# Patient Record
Sex: Female | Born: 1985 | Race: White | Hispanic: No | Marital: Married | Smoking: Current every day smoker
Health system: Southern US, Community
[De-identification: ages and names within clinical notes are randomized; demographics above are authoritative.]

## PROBLEM LIST (undated history)

## (undated) DIAGNOSIS — R102 Pelvic and perineal pain: Secondary | ICD-10-CM

## (undated) DIAGNOSIS — E2839 Other primary ovarian failure: Secondary | ICD-10-CM

## (undated) DIAGNOSIS — D3A026 Benign carcinoid tumor of the rectum: Secondary | ICD-10-CM

## (undated) DIAGNOSIS — K529 Noninfective gastroenteritis and colitis, unspecified: Secondary | ICD-10-CM

## (undated) DIAGNOSIS — S99911A Unspecified injury of right ankle, initial encounter: Secondary | ICD-10-CM

## (undated) DIAGNOSIS — G8929 Other chronic pain: Secondary | ICD-10-CM

## (undated) DIAGNOSIS — K621 Rectal polyp: Secondary | ICD-10-CM

## (undated) DIAGNOSIS — K635 Polyp of colon: Secondary | ICD-10-CM

## (undated) DIAGNOSIS — K219 Gastro-esophageal reflux disease without esophagitis: Secondary | ICD-10-CM

## (undated) DIAGNOSIS — R519 Headache, unspecified: Secondary | ICD-10-CM

## (undated) DIAGNOSIS — R51 Headache: Secondary | ICD-10-CM

## (undated) DIAGNOSIS — D126 Benign neoplasm of colon, unspecified: Secondary | ICD-10-CM

## (undated) DIAGNOSIS — D259 Leiomyoma of uterus, unspecified: Secondary | ICD-10-CM

## (undated) DIAGNOSIS — N200 Calculus of kidney: Secondary | ICD-10-CM

## (undated) HISTORY — DX: Benign neoplasm of colon, unspecified: D12.6

## (undated) HISTORY — DX: Benign carcinoid tumor of the rectum: D3A.026

## (undated) HISTORY — PX: CHOLECYSTECTOMY: SHX55

---

## 2005-04-02 ENCOUNTER — Emergency Department (HOSPITAL_COMMUNITY): Admission: EM | Admit: 2005-04-02 | Discharge: 2005-04-02 | Payer: Self-pay | Admitting: Emergency Medicine

## 2005-12-16 ENCOUNTER — Ambulatory Visit (HOSPITAL_COMMUNITY): Admission: EM | Admit: 2005-12-16 | Discharge: 2005-12-17 | Payer: Self-pay | Admitting: Emergency Medicine

## 2007-01-30 ENCOUNTER — Emergency Department (HOSPITAL_COMMUNITY): Admission: EM | Admit: 2007-01-30 | Discharge: 2007-01-30 | Payer: Self-pay | Admitting: Emergency Medicine

## 2009-12-29 ENCOUNTER — Emergency Department (HOSPITAL_COMMUNITY): Admission: EM | Admit: 2009-12-29 | Discharge: 2009-12-30 | Payer: Self-pay | Admitting: Emergency Medicine

## 2010-01-05 ENCOUNTER — Emergency Department (HOSPITAL_COMMUNITY): Admission: EM | Admit: 2010-01-05 | Discharge: 2010-01-05 | Payer: Self-pay | Admitting: Emergency Medicine

## 2010-02-19 ENCOUNTER — Emergency Department (HOSPITAL_COMMUNITY)
Admission: EM | Admit: 2010-02-19 | Discharge: 2010-02-19 | Payer: Self-pay | Source: Home / Self Care | Admitting: Emergency Medicine

## 2010-05-27 LAB — HEPATIC FUNCTION PANEL
ALT: 27 U/L (ref 0–35)
Albumin: 3.6 g/dL (ref 3.5–5.2)
Alkaline Phosphatase: 55 U/L (ref 39–117)
Total Protein: 6.1 g/dL (ref 6.0–8.3)

## 2010-05-27 LAB — CBC
Platelets: 169 10*3/uL (ref 150–400)
RBC: 4.04 MIL/uL (ref 3.87–5.11)
WBC: 8.8 10*3/uL (ref 4.0–10.5)

## 2010-05-27 LAB — DIFFERENTIAL
Lymphocytes Relative: 25 % (ref 12–46)
Lymphs Abs: 2.2 10*3/uL (ref 0.7–4.0)
Neutro Abs: 5.7 10*3/uL (ref 1.7–7.7)
Neutrophils Relative %: 64 % (ref 43–77)

## 2010-05-27 LAB — URINALYSIS, ROUTINE W REFLEX MICROSCOPIC
Glucose, UA: NEGATIVE mg/dL
Protein, ur: NEGATIVE mg/dL
Urobilinogen, UA: 1 mg/dL (ref 0.0–1.0)

## 2010-05-27 LAB — BASIC METABOLIC PANEL
Calcium: 8.6 mg/dL (ref 8.4–10.5)
Chloride: 108 mEq/L (ref 96–112)
Creatinine, Ser: 0.84 mg/dL (ref 0.4–1.2)
GFR calc Af Amer: >60 mL/min (ref >60)

## 2010-07-13 ENCOUNTER — Emergency Department (HOSPITAL_COMMUNITY)
Admission: EM | Admit: 2010-07-13 | Discharge: 2010-07-14 | Disposition: A | Discharge status: Home / Self Care | Payer: Self-pay | Attending: Emergency Medicine | Admitting: Emergency Medicine

## 2010-07-13 DIAGNOSIS — T622X1A Toxic effect of other ingested (parts of) plant(s), accidental (unintentional), initial encounter: Secondary | ICD-10-CM | POA: Insufficient documentation

## 2010-07-13 DIAGNOSIS — L255 Unspecified contact dermatitis due to plants, except food: Secondary | ICD-10-CM | POA: Insufficient documentation

## 2010-07-13 DIAGNOSIS — F17200 Nicotine dependence, unspecified, uncomplicated: Secondary | ICD-10-CM | POA: Insufficient documentation

## 2010-07-13 DIAGNOSIS — Y929 Unspecified place or not applicable: Secondary | ICD-10-CM | POA: Insufficient documentation

## 2010-07-13 DIAGNOSIS — IMO0002 Reserved for concepts with insufficient information to code with codable children: Secondary | ICD-10-CM | POA: Insufficient documentation

## 2010-07-13 DIAGNOSIS — M79609 Pain in unspecified limb: Secondary | ICD-10-CM | POA: Insufficient documentation

## 2010-07-13 DIAGNOSIS — X58XXXA Exposure to other specified factors, initial encounter: Secondary | ICD-10-CM | POA: Insufficient documentation

## 2010-07-31 NOTE — Consult Note (Signed)
Patricia Rivers, Patricia Rivers              ACCOUNT NO.:  [id number]   MEDICAL RECORD NO.:  [id number]          PATIENT TYPE:  OIB   LOCATION:  A427                          FACILITY:  [facility]   PHYSICIAN:  Tilda Burrow, M.D. DATE OF BIRTH:  [birth year]   DATE OF CONSULTATION:  12/16/2005  DATE OF DISCHARGE:                                   CONSULTATION   ADMISSION DIAGNOSES:  1. Pelvic pain, lower abdominal pain unresponsive to intravenous      narcotics.  2. Dysmenorrhea.  3. Hyperglycemia.   HISTORY OF PRESENT ILLNESS:  This 25 year old gravida 0, para 0, last  menstrual period 4 days ago, with a long history of dysmenorrhea but no  periods ever this painful, has presented to the emergency room with what she  perceives as pain at a 10 out of 10 (she relates this as equal to when she  had her head busted open.  The pain has required several doses of  Dilaudid, however, it has not resolved.  She was seen in the emergency room  by Dr. Margretta Ditty whose evaluation includes a normal white count at 8900,  hemoglobin 15, hematocrit 43.  GLUCOSE IS ELEVATED AT 292.   Blood pressure is normal with a pulse ranging from 60 to 89. The patient was  admitted for monitoring her pain through the night, repeating electrolytes  in a.m., repeat CBC in a.m. and probable short hospital stay.  She has been  evaluated in the emergency room with a pelvic ultrasound which shows a  2.1 cm fibroid in the uterus but is otherwise negative.  Some small amount  of cul-de-sac fluid, and small functional retro-ovarian cyst of 2.1 cm.   PHYSICAL EXAMINATION:  GENERAL:  Weight 170, height 5 feet, 7 inches. The  patient is an uncomfortable appearing Caucasian female. Red headed. Lying on  her right side in a relatively immobile curled up position.  HEENT:  Pupils are equal, round and reactive.  NEURO:  She is alert and oriented.  CHEST:  Breathing is unlabored.  ABDOMEN:  Tender suprapubic area, no rebound tenderness.  PELVIC EXAM:  Not repeated by me but previously extremely uncomfortable by  Dr. Margretta Ditty, but transvaginal ultrasound was not perceived as particularly  painful.   PLAN:  Observe to ensure pain is not worsening and will discharge promptly  to outpatient monitoring if condition does not deteriorate by a.m.  Observed overnite, stable for discharge the following morning.      Tilda Burrow, M.D.  Electronically Signed     JVF/MEDQ  D:  12/16/2005  T:  12/17/2005  Job:  [id number]

## 2010-09-06 ENCOUNTER — Emergency Department (HOSPITAL_COMMUNITY)
Admission: EM | Admit: 2010-09-06 | Discharge: 2010-09-07 | Disposition: A | Discharge status: Home / Self Care | Payer: Self-pay | Attending: Emergency Medicine | Admitting: Emergency Medicine

## 2010-09-06 DIAGNOSIS — M25559 Pain in unspecified hip: Secondary | ICD-10-CM | POA: Insufficient documentation

## 2010-09-06 DIAGNOSIS — F17200 Nicotine dependence, unspecified, uncomplicated: Secondary | ICD-10-CM | POA: Insufficient documentation

## 2010-09-06 DIAGNOSIS — M76899 Other specified enthesopathies of unspecified lower limb, excluding foot: Secondary | ICD-10-CM | POA: Insufficient documentation

## 2010-09-07 ENCOUNTER — Emergency Department (HOSPITAL_COMMUNITY): Payer: Self-pay

## 2010-09-07 LAB — BASIC METABOLIC PANEL
CO2: 21 mEq/L (ref 19–32)
Calcium: 9.7 mg/dL (ref 8.4–10.5)
Chloride: 105 mEq/L (ref 96–112)
Glucose, Bld: 96 mg/dL (ref 70–99)
Sodium: 136 mEq/L (ref 135–145)

## 2010-09-07 LAB — CBC
HCT: 40.5 % (ref 36.0–46.0)
Hemoglobin: 14.2 g/dL (ref 12.0–15.0)
MCHC: 35.1 g/dL (ref 30.0–36.0)
RBC: 4.53 MIL/uL (ref 3.87–5.11)

## 2010-09-07 LAB — DIFFERENTIAL
Basophils Absolute: 0 10*3/uL (ref 0.0–0.1)
Lymphocytes Relative: 37 % (ref 12–46)
Monocytes Absolute: 0.7 10*3/uL (ref 0.1–1.0)
Monocytes Relative: 6 % (ref 3–12)
Neutro Abs: 5.9 10*3/uL (ref 1.7–7.7)

## 2010-12-15 ENCOUNTER — Emergency Department (HOSPITAL_COMMUNITY)
Admission: EM | Admit: 2010-12-15 | Discharge: 2010-12-15 | Disposition: A | Discharge status: Home / Self Care | Payer: Self-pay | Attending: Emergency Medicine | Admitting: Emergency Medicine

## 2010-12-15 ENCOUNTER — Encounter: Payer: Self-pay | Admitting: *Deleted

## 2010-12-15 ENCOUNTER — Emergency Department (HOSPITAL_COMMUNITY): Payer: Self-pay

## 2010-12-15 DIAGNOSIS — M25539 Pain in unspecified wrist: Secondary | ICD-10-CM | POA: Insufficient documentation

## 2010-12-15 DIAGNOSIS — M779 Enthesopathy, unspecified: Secondary | ICD-10-CM | POA: Insufficient documentation

## 2010-12-15 DIAGNOSIS — F17200 Nicotine dependence, unspecified, uncomplicated: Secondary | ICD-10-CM | POA: Insufficient documentation

## 2010-12-15 MED ORDER — DEXAMETHASONE 6 MG PO TABS: ORAL_TABLET | ORAL | Status: AC

## 2010-12-15 MED ORDER — DEXAMETHASONE SODIUM PHOSPHATE 4 MG/ML IJ SOLN
8.0000 mg | Freq: Once | INTRAMUSCULAR | Status: AC
  Administered 2010-12-15: 8 mg via INTRAMUSCULAR
  Filled 2010-12-15: qty 2

## 2010-12-15 NOTE — ED Notes (Signed)
Pt states initial injury was in June 2011 when she fell down stairs landing on left wrist.  Pt reports it was fractured at that time, and went on to receive cortisone injections for about 3 months. Last cortisone injection was September 2011. Wrist has noted edema into the forearm. Pt states most painful when she moves her thumb.  Denies injury/trauma in past 2 days and is uncertain as to why wrist has "just started hurting".  Xray pending.

## 2010-12-15 NOTE — ED Notes (Signed)
Pt c/o pain to her left wrist. Pt states she hurt her wrist about a year ago and 2 days ago started hurting worse. Pt denies any further injury.

## 2010-12-21 NOTE — ED Provider Notes (Signed)
Medical screening examination/treatment/procedure(s) were performed by non-physician practitioner and as supervising physician I was immediately available for consultation/collaboration.   Andrew King, MD 12/21/10 0746 

## 2010-12-21 NOTE — ED Provider Notes (Signed)
History     CSN: [id number] Arrival date & time: 12/15/2010 11:12 AM  Chief Complaint  Patient presents with  . Wrist Pain    (Consider location/radiation/quality/duration/timing/severity/associated sxs/prior treatment) Patient is a 25 y.o. female presenting with wrist pain. The history is provided by the patient.  Wrist Pain This is a recurrent problem. The current episode started yesterday. The problem occurs daily. The problem has been gradually worsening. Pertinent negatives include no abdominal pain, arthralgias, chest pain, chills, coughing, fever, joint swelling or neck pain. Exacerbated by: certain movements. She has tried nothing for the symptoms. The treatment provided no relief.    History reviewed. No pertinent past medical history.  History reviewed. No pertinent past surgical history.  No family history on file.  History  Substance Use Topics  . Smoking status: Current Everyday Smoker -- 0.5 packs/day  . Smokeless tobacco: Not on file  . Alcohol Use: Yes     occasionally    OB History    Grav Para Term Preterm Abortions TAB SAB Ect Mult Living                  Review of Systems  Constitutional: Negative for fever, chills and activity change.       All ROS Neg except as noted in HPI  HENT: Negative for nosebleeds and neck pain.   Eyes: Negative for photophobia and discharge.  Respiratory: Negative for cough, shortness of breath and wheezing.   Cardiovascular: Negative for chest pain and palpitations.  Gastrointestinal: Negative for abdominal pain and blood in stool.  Genitourinary: Negative for dysuria, frequency and hematuria.  Musculoskeletal: Negative for back pain, joint swelling and arthralgias.  Skin: Negative.   Neurological: Negative for dizziness, seizures and speech difficulty.  Psychiatric/Behavioral: Negative for hallucinations and confusion.    Allergies  Codeine; Hydromorphone hcl; and Penicillins  Home Medications   Current  Outpatient Rx  Name Route Sig Dispense Refill  . IBUPROFEN 200 MG PO TABS Oral Take 800 mg by mouth every 6 (six) hours as needed. For pain     . DEXAMETHASONE 6 MG PO TABS  1 po daily with food 7 tablet 0    BP 118/84  Pulse 77  Temp(Src) 98.9 F (37.2 C) (Oral)  Resp 16  Ht 5\' 8"  (1.727 m)  Wt 187 lb (84.823 kg)  BMI 28.43 kg/m2  SpO2 100%  Physical Exam  Nursing note and vitals reviewed. Constitutional: She is oriented to person, place, and time. She appears well-developed and well-nourished.  Non-toxic appearance.  HENT:  Head: Normocephalic.  Right Ear: Tympanic membrane and external ear normal.  Left Ear: Tympanic membrane and external ear normal.  Eyes: EOM and lids are normal. Pupils are equal, round, and reactive to light.  Neck: Normal range of motion. Neck supple. Carotid bruit is not present.  Cardiovascular: Normal rate, regular rhythm, normal heart sounds, intact distal pulses and normal pulses.   Pulmonary/Chest: Breath sounds normal. No respiratory distress.  Abdominal: Soft. Bowel sounds are normal. There is no tenderness. There is no guarding.  Musculoskeletal: Normal range of motion.       Pain with ROM of the left wrist. No hot joints  Lymphadenopathy:       Head (right side): No submandibular adenopathy present.       Head (left side): No submandibular adenopathy present.    She has no cervical adenopathy.  Neurological: She is alert and oriented to person, place, and time. She has normal strength. No cranial  nerve deficit or sensory deficit.  Skin: Skin is warm and dry.  Psychiatric: She has a normal mood and affect. Her speech is normal.    ED Course  Procedures (including critical care time)  Labs Reviewed - No data to display No results found.   1. Tendonitis       MDM  I have reviewed nursing notes, vital signs, and all appropriate lab and imaging results for this patient.        Kathie Dike, [place] 12/21/10 [phone]

## 2010-12-22 LAB — CBC
Hemoglobin: 14.4
RBC: 4.79
WBC: 8.1

## 2010-12-22 LAB — WET PREP, GENITAL: Clue Cells Wet Prep HPF POC: NONE SEEN

## 2010-12-22 LAB — COMPREHENSIVE METABOLIC PANEL
ALT: 14
AST: 14
Alkaline Phosphatase: 59
CO2: 27
GFR calc non Af Amer: >60
Glucose, Bld: 96
Potassium: 4.5
Sodium: 139

## 2010-12-22 LAB — URINALYSIS, ROUTINE W REFLEX MICROSCOPIC
Glucose, UA: NEGATIVE
Ketones, ur: 40 — AB
pH: 5.5

## 2010-12-22 LAB — DIFFERENTIAL
Basophils Relative: 0
Eosinophils Absolute: 0.2
Eosinophils Relative: 2
Monocytes Relative: 7
Neutrophils Relative %: 76

## 2010-12-22 LAB — GC/CHLAMYDIA PROBE AMP, GENITAL: GC Probe Amp, Genital: NEGATIVE

## 2010-12-22 LAB — URINE MICROSCOPIC-ADD ON

## 2010-12-22 LAB — PREGNANCY, URINE: Preg Test, Ur: NEGATIVE

## 2011-03-28 ENCOUNTER — Encounter (HOSPITAL_COMMUNITY): Payer: Self-pay

## 2011-03-28 ENCOUNTER — Emergency Department (HOSPITAL_COMMUNITY)
Admission: EM | Admit: 2011-03-28 | Discharge: 2011-03-29 | Disposition: A | Discharge status: Home / Self Care | Payer: Self-pay | Attending: Emergency Medicine | Admitting: Emergency Medicine

## 2011-03-28 DIAGNOSIS — S335XXA Sprain of ligaments of lumbar spine, initial encounter: Secondary | ICD-10-CM | POA: Insufficient documentation

## 2011-03-28 DIAGNOSIS — S39012A Strain of muscle, fascia and tendon of lower back, initial encounter: Secondary | ICD-10-CM

## 2011-03-28 DIAGNOSIS — M545 Low back pain, unspecified: Secondary | ICD-10-CM | POA: Insufficient documentation

## 2011-03-28 DIAGNOSIS — Y92009 Unspecified place in unspecified non-institutional (private) residence as the place of occurrence of the external cause: Secondary | ICD-10-CM | POA: Insufficient documentation

## 2011-03-28 DIAGNOSIS — X58XXXA Exposure to other specified factors, initial encounter: Secondary | ICD-10-CM | POA: Insufficient documentation

## 2011-03-28 MED ORDER — ONDANSETRON 8 MG PO TBDP
8.0000 mg | ORAL_TABLET | Freq: Once | ORAL | Status: AC
  Administered 2011-03-28: 8 mg via ORAL
  Filled 2011-03-28: qty 1

## 2011-03-28 MED ORDER — DIAZEPAM 5 MG PO TABS
5.0000 mg | ORAL_TABLET | Freq: Once | ORAL | Status: AC
  Administered 2011-03-28: 5 mg via ORAL
  Filled 2011-03-28: qty 1

## 2011-03-28 MED ORDER — OXYCODONE-ACETAMINOPHEN 5-325 MG PO TABS
2.0000 | ORAL_TABLET | Freq: Once | ORAL | Status: AC
  Administered 2011-03-28: 2 via ORAL
  Filled 2011-03-28: qty 2

## 2011-03-28 NOTE — ED Notes (Signed)
Pt states she got up out of the recliner last night and felt her lower back "pop"  States is worse today.

## 2011-03-29 ENCOUNTER — Emergency Department (HOSPITAL_COMMUNITY): Payer: Self-pay

## 2011-03-29 LAB — URINALYSIS, ROUTINE W REFLEX MICROSCOPIC
Bilirubin Urine: NEGATIVE
Glucose, UA: NEGATIVE mg/dL
Hgb urine dipstick: NEGATIVE
Ketones, ur: NEGATIVE mg/dL
Leukocytes, UA: NEGATIVE
Nitrite: NEGATIVE
Protein, ur: NEGATIVE mg/dL
Specific Gravity, Urine: >1.03 — ABNORMAL HIGH (ref 1.005–1.030)
Urobilinogen, UA: 1 mg/dL (ref 0.0–1.0)
pH: 6 (ref 5.0–8.0)

## 2011-03-29 LAB — POCT PREGNANCY, URINE: Preg Test, Ur: NEGATIVE

## 2011-03-29 MED ORDER — METHOCARBAMOL 500 MG PO TABS: ORAL_TABLET | ORAL | Status: DC

## 2011-03-29 MED ORDER — OXYCODONE-ACETAMINOPHEN 5-325 MG PO TABS
1.0000 | ORAL_TABLET | Freq: Once | ORAL | Status: AC
  Administered 2011-03-29: 1 via ORAL
  Filled 2011-03-29: qty 1

## 2011-03-29 MED ORDER — OXYCODONE-ACETAMINOPHEN 5-325 MG PO TABS: 1.0000 | ORAL_TABLET | Freq: Four times a day (QID) | ORAL | Status: AC | PRN

## 2011-03-29 MED ORDER — ONDANSETRON HCL 4 MG PO TABS: 4.0000 mg | ORAL_TABLET | Freq: Four times a day (QID) | ORAL | Status: AC

## 2011-03-29 MED ORDER — KETOROLAC TROMETHAMINE 10 MG PO TABS
10.0000 mg | ORAL_TABLET | Freq: Once | ORAL | Status: AC
  Administered 2011-03-29: 10 mg via ORAL
  Filled 2011-03-29: qty 1

## 2011-03-29 NOTE — ED Provider Notes (Signed)
History     CSN: [id number]  Arrival date & time 03/28/11  2236   First MD Initiated Contact with Patient 03/28/11 2251      Chief Complaint  Patient presents with  . Back Pain    (Consider location/radiation/quality/duration/timing/severity/associated sxs/prior treatment) HPI Comments: Patient states that all last evening she was getting out of a recliner, when she felt a" pop" in her lower back. The patient states that from that time to the time of admission she's been having increasing pain in her lower back the patient tried ibuprofen but this was not successful in improving the pain. She now has pain when she lies down she has pain with movement and request assistance for her pain. There's been no previous injury, or surgery, or procedures of the lower back. She denies any loss of bowel or bladder function up to this point. She has had a great deal of nausea. And request assistance with this as well.  Patient is a 26 y.o. female presenting with back pain. The history is provided by the patient.  Back Pain  Pertinent negatives include no chest pain, no abdominal pain and no dysuria.    History reviewed. No pertinent past medical history.  History reviewed. No pertinent past surgical history.  No family history on file.  History  Substance Use Topics  . Smoking status: Current Everyday Smoker -- 0.5 packs/day  . Smokeless tobacco: Not on file  . Alcohol Use: Yes     occasionally    OB History    Grav Para Term Preterm Abortions TAB SAB Ect Mult Living                  Review of Systems  Constitutional: Negative for activity change.       All ROS Neg except as noted in HPI  HENT: Negative for nosebleeds and neck pain.   Eyes: Negative for photophobia and discharge.  Respiratory: Negative for cough, shortness of breath and wheezing.   Cardiovascular: Negative for chest pain and palpitations.  Gastrointestinal: Negative for abdominal pain and blood in stool.    Genitourinary: Negative for dysuria, frequency, hematuria and difficulty urinating.  Musculoskeletal: Positive for back pain. Negative for arthralgias.  Skin: Negative.   Neurological: Negative for dizziness, seizures and speech difficulty.  Psychiatric/Behavioral: Negative for hallucinations and confusion.    Allergies  Codeine; Hydromorphone hcl; and Penicillins  Home Medications   Current Outpatient Rx  Name Route Sig Dispense Refill  . IBUPROFEN 200 MG PO TABS Oral Take 800 mg by mouth every 6 (six) hours as needed. For pain       BP 131/87  Temp(Src) 99.3 F (37.4 C) (Oral)  Resp 20  Ht 5\' 8"  (1.727 m)  Wt 180 lb (81.647 kg)  BMI 27.37 kg/m2  SpO2 100%  Physical Exam  Nursing note and vitals reviewed. Constitutional: She is oriented to person, place, and time. She appears well-developed and well-nourished.  Non-toxic appearance.  HENT:  Head: Normocephalic.  Right Ear: Tympanic membrane and external ear normal.  Left Ear: Tympanic membrane and external ear normal.  Eyes: EOM and lids are normal. Pupils are equal, round, and reactive to light.  Neck: Normal range of motion. Neck supple. Carotid bruit is not present.  Cardiovascular: Normal rate, regular rhythm, normal heart sounds, intact distal pulses and normal pulses.   Pulmonary/Chest: Breath sounds normal. No respiratory distress.  Abdominal: Soft. Bowel sounds are normal. There is no tenderness. There is no guarding.  Musculoskeletal:  Normal range of motion.       Pain to palpation of the mid lumbar area. No palpable step off. The area is not hot. Patient has pain with change of position.  Lymphadenopathy:       Head (right side): No submandibular adenopathy present.       Head (left side): No submandibular adenopathy present.    She has no cervical adenopathy.  Neurological: She is alert and oriented to person, place, and time. She has normal strength. No cranial nerve deficit or sensory deficit.  Skin: Skin  is warm and dry.  Psychiatric: She has a normal mood and affect. Her speech is normal.    ED Course  Procedures (including critical care time)   Labs Reviewed  POCT PREGNANCY, URINE  POCT PREGNANCY, URINE   Dg Lumbar Spine Complete  03/29/2011  *RADIOLOGY REPORT*  Clinical Data: Low back pain  LUMBAR SPINE - COMPLETE 4+ VIEW  Comparison: 12/30/2009  Findings: Normal lumbar spine alignment.  No compression fracture, wedge shaped deformity or focal kyphosis.  No pars defects.  Normal pedicles.  Unremarkable SI joints.  IMPRESSION: No acute finding.  Original Report Authenticated By: Judie Petit. Ruel Favors, M.D.     No diagnosis found.    MDM  I have reviewed nursing notes, vital signs, and all appropriate lab and imaging results for this patient.  This patient was in her usual state of good health until January 12 when she was getting out of a recliner and felt a" pop in her lower back". This was followed by increasing bouts of pain to the lower back, made worse with change of position in standing. Today the pain got to the point where it was difficult for the patient to move without severe pain, and she came to the emergency department for evaluation and management of her pain. Urine pregnancy test was found to be negative. Lumbar spine films were negative for acute findings.  Urine analysis will be obtained prior to patient discharge. Patient states that after 10 mg of the Percocet she is still having a great deal of pain. Additional Percocet and ketorolac will be given to the patient. Patient's care will be continued by Dr. Jeraldine Loots (1:52 AM). Results for orders placed during the hospital encounter of 03/28/11  POCT PREGNANCY, URINE      Component Value Range   Preg Test, Ur NEGATIVE    URINALYSIS, ROUTINE W REFLEX MICROSCOPIC      Component Value Range   Color, Urine YELLOW  YELLOW    APPearance CLEAR  CLEAR    Specific Gravity, Urine >1.030 (*) 1.005 - 1.030    pH 6.0  5.0 - 8.0     Glucose, UA NEGATIVE  NEGATIVE (mg/dL)   Hgb urine dipstick NEGATIVE  NEGATIVE    Bilirubin Urine NEGATIVE  NEGATIVE    Ketones, ur NEGATIVE  NEGATIVE (mg/dL)   Protein, ur NEGATIVE  NEGATIVE (mg/dL)   Urobilinogen, UA 1.0  0.0 - 1.0 (mg/dL)   Nitrite NEGATIVE  NEGATIVE    Leukocytes, UA NEGATIVE  NEGATIVE    Dg Lumbar Spine Complete  03/29/2011  *RADIOLOGY REPORT*  Clinical Data: Low back pain  LUMBAR SPINE - COMPLETE 4+ VIEW  Comparison: 12/30/2009  Findings: Normal lumbar spine alignment.  No compression fracture, wedge shaped deformity or focal kyphosis.  No pars defects.  Normal pedicles.  Unremarkable SI joints.  IMPRESSION: No acute finding.  Original Report Authenticated By: Judie Petit. TREVOR SHICK, M.D.  Kathie Dike, [place] 03/29/11 [phone]

## 2011-03-29 NOTE — ED Notes (Signed)
Discharge instructions reviewed with pt, questions answered. Pt verbalized understanding.  

## 2011-03-29 NOTE — ED Notes (Signed)
Pt out to radiology

## 2011-04-01 NOTE — ED Provider Notes (Signed)
Medical screening examination/treatment/procedure(s) were performed by non-physician practitioner and as supervising physician I was immediately available for consultation/collaboration.   Gerhard Munch, MD 04/01/11 [phone]

## 2011-05-17 ENCOUNTER — Emergency Department (HOSPITAL_COMMUNITY): Payer: Self-pay

## 2011-05-17 ENCOUNTER — Emergency Department (HOSPITAL_COMMUNITY)
Admission: EM | Admit: 2011-05-17 | Discharge: 2011-05-17 | Disposition: A | Discharge status: Home / Self Care | Payer: Self-pay | Attending: Emergency Medicine | Admitting: Emergency Medicine

## 2011-05-17 ENCOUNTER — Encounter (HOSPITAL_COMMUNITY): Payer: Self-pay | Admitting: Emergency Medicine

## 2011-05-17 DIAGNOSIS — X503XXA Overexertion from repetitive movements, initial encounter: Secondary | ICD-10-CM | POA: Insufficient documentation

## 2011-05-17 DIAGNOSIS — S39012A Strain of muscle, fascia and tendon of lower back, initial encounter: Secondary | ICD-10-CM

## 2011-05-17 DIAGNOSIS — S335XXA Sprain of ligaments of lumbar spine, initial encounter: Secondary | ICD-10-CM | POA: Insufficient documentation

## 2011-05-17 MED ORDER — OXYCODONE-ACETAMINOPHEN 5-325 MG PO TABS
1.0000 | ORAL_TABLET | Freq: Once | ORAL | Status: AC
  Administered 2011-05-17: 1 via ORAL
  Filled 2011-05-17: qty 1

## 2011-05-17 MED ORDER — METHOCARBAMOL 500 MG PO TABS
1000.0000 mg | ORAL_TABLET | Freq: Once | ORAL | Status: AC
  Administered 2011-05-17: 1000 mg via ORAL
  Filled 2011-05-17: qty 2

## 2011-05-17 MED ORDER — HYDROCODONE-ACETAMINOPHEN 5-325 MG PO TABS: ORAL_TABLET | ORAL | Status: AC

## 2011-05-17 MED ORDER — METHOCARBAMOL 500 MG PO TABS: ORAL_TABLET | ORAL | Status: DC

## 2011-05-17 NOTE — ED Notes (Signed)
Patient transported to X-ray 

## 2011-05-17 NOTE — ED Notes (Signed)
Returned from x-ray.  Pain low back. Alert, talking,  Visitor at bedside.

## 2011-05-17 NOTE — Discharge Instructions (Signed)

## 2011-05-17 NOTE — ED Notes (Signed)
Pt states her lower back is hurting up to the middle. Pt states her legs are giving out as well. Pt states she moved furniture yesterday, but denies any injury to the back.

## 2011-05-17 NOTE — ED Provider Notes (Signed)
History     CSN: [id number]  Arrival date & time 05/17/11  1019   First MD Initiated Contact with Patient 05/17/11 1045      Chief Complaint  Patient presents with  . Back Pain  . Fall    (Consider location/radiation/quality/duration/timing/severity/associated sxs/prior treatment) HPI Comments: Patient c/o lower back pain that began yesterday after she was lifting and moving furniture.  Describes the pain as sharp and radiating into her bilateral thighs.  She denies incontinence, perineal  Numbness,dysuria, weakness or abdominal pain.    Patient is a 26 y.o. female presenting with back pain. The history is provided by the patient. No language interpreter was used.  Back Pain  This is a new problem. The current episode started yesterday. The problem occurs constantly. The problem has not changed since onset.The pain is associated with lifting heavy objects and twisting. The pain is present in the lumbar spine and sacro-iliac joint. The quality of the pain is described as aching. The pain radiates to the left thigh and right thigh. The pain is mild. The symptoms are aggravated by bending, twisting and certain positions. The pain is the same all the time. Associated symptoms include leg pain. Pertinent negatives include no chest pain, no fever, no numbness, no abdominal pain, no abdominal swelling, no bowel incontinence, no perianal numbness, no bladder incontinence, no dysuria, no pelvic pain, no paresthesias, no paresis, no tingling and no weakness. She has tried muscle relaxants and NSAIDs for the symptoms. The treatment provided no relief.    History reviewed. No pertinent past medical history.  History reviewed. No pertinent past surgical history.  History reviewed. No pertinent family history.  History  Substance Use Topics  . Smoking status: Current Everyday Smoker -- 0.5 packs/day  . Smokeless tobacco: Not on file  . Alcohol Use: Yes     occasionally    OB History    Grav  Para Term Preterm Abortions TAB SAB Ect Mult Living                  Review of Systems  Constitutional: Negative for fever, chills and fatigue.  HENT: Negative for neck pain and neck stiffness.   Respiratory: Negative for shortness of breath.   Cardiovascular: Negative for chest pain.  Gastrointestinal: Negative for nausea, vomiting, abdominal pain and bowel incontinence.  Genitourinary: Negative for bladder incontinence, dysuria, hematuria, flank pain, difficulty urinating and pelvic pain.  Musculoskeletal: Positive for back pain. Negative for myalgias and arthralgias.  Skin: Negative for rash.  Neurological: Negative for dizziness, tingling, weakness, numbness and paresthesias.  Hematological: Does not bruise/bleed easily.  All other systems reviewed and are negative.    Allergies  Codeine; Hydromorphone hcl; and Penicillins  Home Medications   Current Outpatient Rx  Name Route Sig Dispense Refill  . IBUPROFEN 200 MG PO TABS Oral Take 800 mg by mouth every 6 (six) hours as needed. For pain     . METHOCARBAMOL 500 MG PO TABS  2 tabs by mouth 3 times a day for spasm/pain 30 tablet 0    BP 122/80  Pulse 95  Temp(Src) 97.6 F (36.4 C) (Oral)  Resp 18  Ht 5\' 8"  (1.727 m)  Wt 175 lb (79.379 kg)  BMI 26.61 kg/m2  SpO2 100%  LMP 05/14/2011  Physical Exam  Nursing note and vitals reviewed. Constitutional: She is oriented to person, place, and time. She appears well-developed and well-nourished. No distress.  HENT:  Head: Normocephalic and atraumatic.  Cardiovascular: Normal rate,  regular rhythm, normal heart sounds and intact distal pulses.   No murmur heard. Pulmonary/Chest: Effort normal and breath sounds normal. No respiratory distress.  Abdominal: Soft. She exhibits no distension. There is no tenderness. There is no rebound and no guarding.  Musculoskeletal: She exhibits tenderness. She exhibits no edema.       Lumbar back: She exhibits tenderness. She exhibits normal  range of motion, no bony tenderness, no swelling, no edema, no spasm and normal pulse.       Back:  Neurological: She is alert and oriented to person, place, and time. No cranial nerve deficit or sensory deficit. She exhibits normal muscle tone. Coordination and gait normal.  Reflex Scores:      Patellar reflexes are 2+ on the right side and 2+ on the left side.      Achilles reflexes are 2+ on the right side and 2+ on the left side. Skin: Skin is warm and dry.    ED Course  Procedures (including critical care time)  Labs Reviewed - No data to display Dg Lumbar Spine Complete  05/17/2011  *RADIOLOGY REPORT*  Clinical Data: Low back pain following a fall.  LUMBAR SPINE - COMPLETE 4+ VIEW  Comparison: 03/29/2011.  Findings: Five non-rib bearing lumbar vertebrae.  These continue to have normal appearances.  No fractures, pars defects or subluxations seen.  IMPRESSION: Normal examination, unchanged.  Original Report Authenticated By: Darrol Angel, M.D.        MDM    Patient has tenderness to palpation of the lumbar paraspinal muscles and bilateral SI joints area no focal neuro deficits on exam. Patient is ambulatory with a steady gait.  She requests a referral for a primary care physician.  She agrees to close followup with the return here if symptoms worsen.   Patient / Family / Caregiver understand and agree with initial ED impression and plan with expectations set for ED visit. Pt stable in ED with no significant deterioration in condition.                      Ora Bollig L. Trisha Mangle, [place] 05/17/11 1935

## 2011-05-18 NOTE — ED Provider Notes (Signed)
Medical screening examination/treatment/procedure(s) were performed by non-physician practitioner and as supervising physician I was immediately available for consultation/collaboration.  Nicoletta Dress. Colon Branch, MD 05/18/11 [phone]

## 2011-06-25 ENCOUNTER — Other Ambulatory Visit: Payer: Self-pay | Admitting: Family Medicine

## 2011-06-25 DIAGNOSIS — M545 Low back pain: Secondary | ICD-10-CM

## 2011-07-01 ENCOUNTER — Ambulatory Visit
Admission: RE | Admit: 2011-07-01 | Discharge: 2011-07-01 | Disposition: A | Discharge status: Home / Self Care | Payer: No Typology Code available for payment source | Source: Ambulatory Visit | Attending: Family Medicine | Admitting: Family Medicine

## 2011-07-01 DIAGNOSIS — M545 Low back pain: Secondary | ICD-10-CM

## 2013-04-03 ENCOUNTER — Emergency Department (HOSPITAL_COMMUNITY): Payer: Self-pay

## 2013-04-03 ENCOUNTER — Encounter (HOSPITAL_COMMUNITY): Payer: Self-pay | Admitting: Emergency Medicine

## 2013-04-03 ENCOUNTER — Emergency Department (HOSPITAL_COMMUNITY)
Admission: EM | Admit: 2013-04-03 | Discharge: 2013-04-03 | Disposition: A | Discharge status: Home / Self Care | Payer: Self-pay | Attending: Emergency Medicine | Admitting: Emergency Medicine

## 2013-04-03 DIAGNOSIS — R1011 Right upper quadrant pain: Secondary | ICD-10-CM | POA: Insufficient documentation

## 2013-04-03 DIAGNOSIS — Z3202 Encounter for pregnancy test, result negative: Secondary | ICD-10-CM | POA: Insufficient documentation

## 2013-04-03 DIAGNOSIS — R197 Diarrhea, unspecified: Secondary | ICD-10-CM | POA: Insufficient documentation

## 2013-04-03 DIAGNOSIS — R109 Unspecified abdominal pain: Secondary | ICD-10-CM

## 2013-04-03 DIAGNOSIS — R112 Nausea with vomiting, unspecified: Secondary | ICD-10-CM | POA: Insufficient documentation

## 2013-04-03 DIAGNOSIS — Z880 Allergy status to penicillin: Secondary | ICD-10-CM | POA: Insufficient documentation

## 2013-04-03 DIAGNOSIS — F17200 Nicotine dependence, unspecified, uncomplicated: Secondary | ICD-10-CM | POA: Insufficient documentation

## 2013-04-03 LAB — HEPATIC FUNCTION PANEL
ALBUMIN: 3.7 g/dL (ref 3.5–5.2)
ALK PHOS: 92 U/L (ref 39–117)
ALT: 27 U/L (ref 0–35)
AST: 20 U/L (ref 0–37)
BILIRUBIN TOTAL: 0.7 mg/dL (ref 0.3–1.2)
Total Protein: 7 g/dL (ref 6.0–8.3)

## 2013-04-03 LAB — BASIC METABOLIC PANEL
BUN: 12 mg/dL (ref 6–23)
CALCIUM: 9.5 mg/dL (ref 8.4–10.5)
CO2: 25 meq/L (ref 19–32)
CREATININE: 0.78 mg/dL (ref 0.50–1.10)
Chloride: 103 mEq/L (ref 96–112)
GFR calc Af Amer: >90 mL/min (ref >90)
GFR calc non Af Amer: >90 mL/min (ref >90)
GLUCOSE: 88 mg/dL (ref 70–99)
Potassium: 4.5 mEq/L (ref 3.7–5.3)
Sodium: 139 mEq/L (ref 137–147)

## 2013-04-03 LAB — PREGNANCY, URINE: Preg Test, Ur: NEGATIVE

## 2013-04-03 LAB — URINALYSIS, ROUTINE W REFLEX MICROSCOPIC
BILIRUBIN URINE: NEGATIVE
GLUCOSE, UA: NEGATIVE mg/dL
HGB URINE DIPSTICK: NEGATIVE
KETONES UR: NEGATIVE mg/dL
Leukocytes, UA: NEGATIVE
Nitrite: NEGATIVE
PH: 6 (ref 5.0–8.0)
PROTEIN: NEGATIVE mg/dL
Specific Gravity, Urine: 1.025 (ref 1.005–1.030)
Urobilinogen, UA: 1 mg/dL (ref 0.0–1.0)

## 2013-04-03 LAB — CBC
HCT: 44 % (ref 36.0–46.0)
HEMOGLOBIN: 15.3 g/dL — AB (ref 12.0–15.0)
MCH: 31.3 pg (ref 26.0–34.0)
MCHC: 34.8 g/dL (ref 30.0–36.0)
MCV: 90 fL (ref 78.0–100.0)
Platelets: 180 10*3/uL (ref 150–400)
RBC: 4.89 MIL/uL (ref 3.87–5.11)
RDW: 12.6 % (ref 11.5–15.5)
WBC: 8.2 10*3/uL (ref 4.0–10.5)

## 2013-04-03 LAB — LIPASE, BLOOD: Lipase: 29 U/L (ref 11–59)

## 2013-04-03 MED ORDER — SODIUM CHLORIDE 0.9 % IV BOLUS (SEPSIS)
1000.0000 mL | Freq: Once | INTRAVENOUS | Status: AC
  Administered 2013-04-03: 1000 mL via INTRAVENOUS

## 2013-04-03 MED ORDER — ONDANSETRON HCL 4 MG/2ML IJ SOLN
4.0000 mg | Freq: Once | INTRAMUSCULAR | Status: AC
  Administered 2013-04-03: 4 mg via INTRAVENOUS
  Filled 2013-04-03: qty 2

## 2013-04-03 MED ORDER — MORPHINE SULFATE 4 MG/ML IJ SOLN
4.0000 mg | Freq: Once | INTRAMUSCULAR | Status: AC
  Administered 2013-04-03: 4 mg via INTRAVENOUS
  Filled 2013-04-03: qty 1

## 2013-04-03 MED ORDER — PROMETHAZINE HCL 25 MG PO TABS: 25.0000 mg | ORAL_TABLET | Freq: Four times a day (QID) | ORAL | Status: DC | PRN

## 2013-04-03 MED ORDER — OXYCODONE-ACETAMINOPHEN 5-325 MG PO TABS: 1.0000 | ORAL_TABLET | ORAL | Status: DC | PRN

## 2013-04-03 NOTE — Discharge Instructions (Signed)
Abdominal Pain, Adult Many things can cause belly (abdominal) pain. Most times, the belly pain is not dangerous. Many cases of belly pain can be watched and treated at home. HOME CARE   Do not take medicines that help you go poop (laxatives) unless told to by your doctor.  Only take medicine as told by your doctor.  Eat or drink as told by your doctor. Your doctor will tell you if you should be on a special diet. GET HELP IF:  You do not know what is causing your belly pain.  You have belly pain while you are sick to your stomach (nauseous) or have runny poop (diarrhea).  You have pain while you pee or poop.  Your belly pain wakes you up at night.  You have belly pain that gets worse or better when you eat.  You have belly pain that gets worse when you eat fatty foods. GET HELP RIGHT AWAY IF:   The pain does not go away within 2 hours.  You have a fever.  You keep throwing up (vomiting).  The pain changes and is only in the right or left part of the belly.  You have bloody or tarry looking poop. MAKE SURE YOU:   Understand these instructions.  Will watch your condition.  Will get help right away if you are not doing well or get worse. Document Released: 08/18/2007 Document Revised: 12/20/2012 Document Reviewed: 11/08/2012 [facility] Patient Information 2014 [place].   Ultrasound tomorrow at 11 AM. Registered nurse will give you more information. Medication for pain and nausea. Avoid rich greasy foods. Increase fluids.

## 2013-04-03 NOTE — ED Provider Notes (Addendum)
CSN: [id number]     Arrival date & time 04/03/13  1318 History  This chart was scribed for Nat Christen, MD by Roxan Diesel, ED scribe.  This patient was seen in room APA15/APA15 and the patient's care was started at 2:18 PM.   Chief Complaint  Patient presents with  . Abdominal Pain    The history is provided by the patient. No language interpreter was used.    HPI Comments: Patricia Rivers is a 28 y.o. female who presents to the Emergency Department complaining of severe, sharp, progressively-worsening RUQ abdominal pain that began 5 days ago.  Pt states that her pain radiates into RLQ and right flank.  She also complains of nausea, vomiting and diarrhea.  She states she is vomiting whenever she eats.  She denies urinary symptoms.  Pt is on ibuprofen 800mg  for a foot fracture.  She denies using any other medications currently.  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  Pt admits to family h/o cholecystectomy (grandmother and sister).   No past medical history on file.  History reviewed. No pertinent past surgical history.  No family history on file.   History  Substance Use Topics  . Smoking status: Current Every Day Smoker -- 0.50 packs/day  . Smokeless tobacco: Not on file  . Alcohol Use: Yes     Comment: occasionally    OB History   Grav Para Term Preterm Abortions TAB SAB Ect Mult Living                  Review of Systems A complete 10 system review of systems was obtained and all systems are negative except as noted in the HPI and PMH.    Allergies  Codeine; Hydromorphone hcl; and Penicillins  Home Medications   Current Outpatient Rx  Name  Route  Sig  Dispense  Refill  . HYDROcodone-acetaminophen (NORCO) 7.5-325 MG per tablet   Oral   Take 1 tablet by mouth 2 (two) times daily as needed for moderate pain.         [name] ibuprofen (ADVIL,MOTRIN) 800 MG tablet   Oral   Take 800 mg by mouth daily as needed for moderate pain.         [name]  oxyCODONE-acetaminophen (PERCOCET) 5-325 MG per tablet   Oral   Take 1 tablet by mouth every 4 (four) hours as needed.   20 tablet   0   . promethazine (PHENERGAN) 25 MG tablet   Oral   Take 1 tablet (25 mg total) by mouth every 6 (six) hours as needed for nausea or vomiting.   20 tablet   0    BP 115/81  Pulse 90  Temp(Src) 97.4 F (36.3 C) (Oral)  Resp 20  Ht 5\' 8"  (1.727 m)  Wt 218 lb (98.884 kg)  BMI 33.15 kg/m2  SpO2 99%  Physical Exam  Nursing note and vitals reviewed. Constitutional: She is oriented to person, place, and time. She appears well-developed and well-nourished.  HENT:  Head: Normocephalic and atraumatic.  Eyes: Conjunctivae and EOM are normal. Pupils are equal, round, and reactive to light.  Neck: Normal range of motion. Neck supple.  Cardiovascular: Normal rate, regular rhythm and normal heart sounds.   Pulmonary/Chest: Effort normal and breath sounds normal.  Abdominal: Soft. Bowel sounds are normal. There is tenderness (Minimal tenderness to RLQ, RUQ and right flank).  Musculoskeletal: Normal range of motion.  Neurological: She is alert and oriented to person, place, and time.  Skin:  Skin is warm and dry.  Psychiatric: She has a normal mood and affect. Her behavior is normal.    ED Course  Procedures (including critical care time)  DIAGNOSTIC STUDIES: Oxygen Saturation is 99% on room air, normal by my interpretation.    COORDINATION OF CARE: 2:21 PM-Discussed treatment plan which includes US abdomen and pain medication with pt at bedside and pt agreed to plan.    Labs Review Labs Reviewed  CBC - Abnormal; Notable for the following:    Hemoglobin 15.3 (*)    All other components within normal limits  BASIC METABOLIC PANEL  URINALYSIS, ROUTINE W REFLEX MICROSCOPIC  PREGNANCY, URINE  HEPATIC FUNCTION PANEL  LIPASE, BLOOD    Imaging Review Dg Abd Acute W/chest  04/03/2013   CLINICAL DATA:  Right-sided pain with diarrhea and vomiting.   EXAM: ACUTE ABDOMEN SERIES (ABDOMEN 2 VIEW & CHEST 1 VIEW)  COMPARISON:  Chest x-ray 12/30/2009 and lumbar spine 03/29/2011  FINDINGS: Lungs are clear. Cardiomediastinal silhouette and remainder of the chest is unchanged.  Abdominal films demonstrate no free peritoneal air. There are several air-filled nondilated loops of large small bowel. Remaining bones and soft tissues are unremarkable.  IMPRESSION: No acute cardiopulmonary disease.  Nonspecific, nonobstructive bowel gas pattern.   Electronically Signed   By: Marin Olp M.D.   On: 04/03/2013 16:28    EKG Interpretation   None       MDM   1. Abdominal pain    No acute abdomen. Screening labs, urinalysis, abdominal series negative. We'll schedule ultrasound of gallbladder for tomorrow. Discharge medications Percocet and Phenergan 25 mg    I personally performed the services described in this documentation, which was scribed in my presence. The recorded information has been reviewed and is accurate.    Nat Christen, MD 04/03/13 [place], MD 04/05/13 2051

## 2013-04-03 NOTE — ED Notes (Signed)
Pt alert & oriented x4, stable gait. Patient given discharge instructions, paperwork & prescription(s). Patient  instructed to stop at the registration desk to finish any additional paperwork. Patient verbalized understanding. Pt left department w/ no further questions. 

## 2013-04-03 NOTE — ED Notes (Signed)
Nausea and vomiting for the past 5 days, abdominal pin right lower quadrant onset last night

## 2013-04-04 ENCOUNTER — Ambulatory Visit (HOSPITAL_COMMUNITY)
Admission: RE | Admit: 2013-04-04 | Discharge: 2013-04-04 | Disposition: A | Discharge status: Home / Self Care | Payer: Self-pay | Source: Ambulatory Visit | Attending: Emergency Medicine | Admitting: Emergency Medicine

## 2013-04-04 DIAGNOSIS — R109 Unspecified abdominal pain: Secondary | ICD-10-CM | POA: Insufficient documentation

## 2013-04-04 NOTE — ED Provider Notes (Signed)
11:05  04/04/13  Patient returned this morning for outpatient abdominal ultrasound. Ultrasound revealed prominent liver without acute findings. No evidence of gallstones or gallbladder wall thickening. Results reviewed and discussed with the patient. She agrees to close followup, referral information for local primary care physicians and [facility] was given.  Hector Taft L. Vanessa [place], PA-C 04/04/13 1205

## 2013-04-05 DIAGNOSIS — R1011 Right upper quadrant pain: Secondary | ICD-10-CM | POA: Insufficient documentation

## 2013-04-05 DIAGNOSIS — Z3202 Encounter for pregnancy test, result negative: Secondary | ICD-10-CM | POA: Insufficient documentation

## 2013-04-05 DIAGNOSIS — Z880 Allergy status to penicillin: Secondary | ICD-10-CM | POA: Insufficient documentation

## 2013-04-05 DIAGNOSIS — R112 Nausea with vomiting, unspecified: Secondary | ICD-10-CM | POA: Insufficient documentation

## 2013-04-05 DIAGNOSIS — F17200 Nicotine dependence, unspecified, uncomplicated: Secondary | ICD-10-CM | POA: Insufficient documentation

## 2013-04-05 NOTE — ED Provider Notes (Signed)
Medical screening examination/treatment/procedure(s) were performed by non-physician practitioner and as supervising physician I was immediately available for consultation/collaboration.  EKG Interpretation   None        Nat Christen, MD 04/05/13 1606

## 2013-04-06 ENCOUNTER — Encounter (HOSPITAL_COMMUNITY): Payer: Self-pay | Admitting: Emergency Medicine

## 2013-04-06 ENCOUNTER — Emergency Department (HOSPITAL_COMMUNITY)
Admission: EM | Admit: 2013-04-06 | Discharge: 2013-04-06 | Disposition: A | Discharge status: Home / Self Care | Payer: Self-pay | Attending: Emergency Medicine | Admitting: Emergency Medicine

## 2013-04-06 ENCOUNTER — Emergency Department (HOSPITAL_COMMUNITY): Payer: Self-pay

## 2013-04-06 DIAGNOSIS — R109 Unspecified abdominal pain: Secondary | ICD-10-CM

## 2013-04-06 LAB — CBC WITH DIFFERENTIAL/PLATELET
BASOS ABS: 0 10*3/uL (ref 0.0–0.1)
Basophils Relative: 0 % (ref 0–1)
EOS ABS: 0.5 10*3/uL (ref 0.0–0.7)
EOS PCT: 6 % — AB (ref 0–5)
HCT: 41.8 % (ref 36.0–46.0)
Hemoglobin: 14.4 g/dL (ref 12.0–15.0)
Lymphocytes Relative: 38 % (ref 12–46)
Lymphs Abs: 3.7 10*3/uL (ref 0.7–4.0)
MCH: 31.2 pg (ref 26.0–34.0)
MCHC: 34.4 g/dL (ref 30.0–36.0)
MCV: 90.5 fL (ref 78.0–100.0)
Monocytes Absolute: 0.7 10*3/uL (ref 0.1–1.0)
Monocytes Relative: 7 % (ref 3–12)
Neutro Abs: 4.7 10*3/uL (ref 1.7–7.7)
Neutrophils Relative %: 49 % (ref 43–77)
PLATELETS: 193 10*3/uL (ref 150–400)
RBC: 4.62 MIL/uL (ref 3.87–5.11)
RDW: 12.5 % (ref 11.5–15.5)
WBC: 9.6 10*3/uL (ref 4.0–10.5)

## 2013-04-06 LAB — COMPREHENSIVE METABOLIC PANEL
ALT: 24 U/L (ref 0–35)
AST: 17 U/L (ref 0–37)
Albumin: 3.6 g/dL (ref 3.5–5.2)
Alkaline Phosphatase: 84 U/L (ref 39–117)
BUN: 13 mg/dL (ref 6–23)
CALCIUM: 9.6 mg/dL (ref 8.4–10.5)
CO2: 27 meq/L (ref 19–32)
CREATININE: 0.8 mg/dL (ref 0.50–1.10)
Chloride: 101 mEq/L (ref 96–112)
GFR calc non Af Amer: >90 mL/min (ref >90)
GLUCOSE: 102 mg/dL — AB (ref 70–99)
Potassium: 4.2 mEq/L (ref 3.7–5.3)
Sodium: 138 mEq/L (ref 137–147)
Total Bilirubin: 0.3 mg/dL (ref 0.3–1.2)
Total Protein: 6.6 g/dL (ref 6.0–8.3)

## 2013-04-06 LAB — HCG, QUANTITATIVE, PREGNANCY: HCG, BETA CHAIN, QUANT, S: 1 m[IU]/mL (ref <5)

## 2013-04-06 LAB — LIPASE, BLOOD: Lipase: 31 U/L (ref 11–59)

## 2013-04-06 MED ORDER — SODIUM CHLORIDE 0.9 % IV SOLN
1000.0000 mL | Freq: Once | INTRAVENOUS | Status: AC
  Administered 2013-04-06: 1000 mL via INTRAVENOUS

## 2013-04-06 MED ORDER — IOHEXOL 300 MG/ML  SOLN
50.0000 mL | Freq: Once | INTRAMUSCULAR | Status: AC | PRN
  Administered 2013-04-06: 50 mL via ORAL

## 2013-04-06 MED ORDER — PROMETHAZINE HCL 25 MG RE SUPP: 25.0000 mg | Freq: Four times a day (QID) | RECTAL | Status: DC | PRN

## 2013-04-06 MED ORDER — IOHEXOL 300 MG/ML  SOLN
100.0000 mL | Freq: Once | INTRAMUSCULAR | Status: AC | PRN
  Administered 2013-04-06: 100 mL via INTRAVENOUS

## 2013-04-06 MED ORDER — ONDANSETRON HCL 4 MG/2ML IJ SOLN
4.0000 mg | Freq: Once | INTRAMUSCULAR | Status: AC
  Administered 2013-04-06: 4 mg via INTRAVENOUS
  Filled 2013-04-06: qty 2

## 2013-04-06 MED ORDER — MORPHINE SULFATE 4 MG/ML IJ SOLN
6.0000 mg | Freq: Once | INTRAMUSCULAR | Status: AC
  Administered 2013-04-06: 6 mg via INTRAVENOUS
  Filled 2013-04-06: qty 2

## 2013-04-06 MED ORDER — SODIUM CHLORIDE 0.9 % IV SOLN
1000.0000 mL | INTRAVENOUS | Status: DC
  Administered 2013-04-06: 1000 mL via INTRAVENOUS

## 2013-04-06 MED ORDER — METOCLOPRAMIDE HCL 5 MG/ML IJ SOLN
10.0000 mg | Freq: Once | INTRAMUSCULAR | Status: AC
  Administered 2013-04-06: 10 mg via INTRAVENOUS
  Filled 2013-04-06: qty 2

## 2013-04-06 NOTE — ED Notes (Signed)
Pt seen here in the ER few days ago for gall bladder. Pt states medications not working, pt presents w/ abdominal pain, vomiting & diarrhea.

## 2013-04-06 NOTE — ED Provider Notes (Signed)
CSN: [id number]     Arrival date & time 04/05/13  2357 History   First MD Initiated Contact with Patient 04/06/13 0048     Chief Complaint  Patient presents with  . Abdominal Pain    HPI Patient reports ongoing intermittent right upper quadrant abdominal pain associated with nausea vomiting.  She was seen recently in the past several days for similar symptoms and had an ultrasound done at that time that demonstrated no evidence of cholelithiasis or cholecystitis.  This was an ultrasound limited to the right upper quadrant.  Patient denies fevers and chills.  She states that more difficulty keeping fluids down because of the nausea and vomiting.  She was taking Zofran which does not seem to necessarily help.  She's never really had issues like this before.  She denies back pain.  No chest pain.  No shortness of breath.  Symptoms are mild to moderate in severity.  Nothing worsens or improves her symptoms  History reviewed. No pertinent past medical history. History reviewed. No pertinent past surgical history. No family history on file. History  Substance Use Topics  . Smoking status: Current Every Day Smoker -- 0.50 packs/day  . Smokeless tobacco: Not on file  . Alcohol Use: Yes     Comment: occasionally   OB History   Grav Para Term Preterm Abortions TAB SAB Ect Mult Living                 Review of Systems  All other systems reviewed and are negative.    Allergies  Codeine; Hydromorphone hcl; and Penicillins  Home Medications   Current Outpatient Rx  Name  Route  Sig  Dispense  Refill  . ibuprofen (ADVIL,MOTRIN) 800 MG tablet   Oral   Take 800 mg by mouth daily as needed for moderate pain.         [name] oxyCODONE-acetaminophen (PERCOCET) 5-325 MG per tablet   Oral   Take 1 tablet by mouth every 4 (four) hours as needed.   20 tablet   0   . promethazine (PHENERGAN) 25 MG tablet   Oral   Take 1 tablet (25 mg total) by mouth every 6 (six) hours as needed for nausea or  vomiting.   20 tablet   0   . HYDROcodone-acetaminophen (NORCO) 7.5-325 MG per tablet   Oral   Take 1 tablet by mouth 2 (two) times daily as needed for moderate pain.         . promethazine (PHENERGAN) 25 MG suppository   Rectal   Place 1 suppository (25 mg total) rectally every 6 (six) hours as needed for nausea or vomiting.   12 each   0    BP 101/64  Pulse 74  Temp(Src) 97.7 F (36.5 C) (Oral)  Resp 20  Ht 5\' 8"  (1.727 m)  Wt 218 lb (98.884 kg)  BMI 33.15 kg/m2  SpO2 96%  LMP 03/14/2011 Physical Exam  Nursing note and vitals reviewed. Constitutional: She is oriented to person, place, and time. She appears well-developed and well-nourished. No distress.  HENT:  Head: Normocephalic and atraumatic.  Eyes: EOM are normal.  Neck: Normal range of motion.  Cardiovascular: Normal rate, regular rhythm and normal heart sounds.   Pulmonary/Chest: Effort normal and breath sounds normal.  Abdominal: Soft. She exhibits no distension. There is no tenderness.  Musculoskeletal: Normal range of motion.  Neurological: She is alert and oriented to person, place, and time.  Skin: Skin is warm and dry.  Psychiatric: She has a normal mood and affect. Judgment normal.    ED Course  Procedures (including critical care time) Labs Review Labs Reviewed  CBC WITH DIFFERENTIAL - Abnormal; Notable for the following:    Eosinophils Relative 6 (*)    All other components within normal limits  COMPREHENSIVE METABOLIC PANEL - Abnormal; Notable for the following:    Glucose, Bld 102 (*)    All other components within normal limits  LIPASE, BLOOD  HCG, QUANTITATIVE, PREGNANCY   Imaging Review Ct Abdomen Pelvis W Contrast  04/06/2013   CLINICAL DATA:  Right upper quadrant abdominal pain, vomiting and diarrhea.  EXAM: CT ABDOMEN AND PELVIS WITH CONTRAST  TECHNIQUE: Multidetector CT imaging of the abdomen and pelvis was performed using the standard protocol following bolus administration of  intravenous contrast.  CONTRAST:  28mL OMNIPAQUE IOHEXOL 300 MG/ML SOLN, 128mL OMNIPAQUE IOHEXOL 300 MG/ML SOLN  COMPARISON:  CT ABD/PELVIS W CM dated 12/30/2009; MR L SPINE W/O dated 07/01/2011  FINDINGS: The visualized lung bases are clear.  A few tiny hypodensities within the liver are nonspecific but may reflect tiny cysts. The liver and spleen are otherwise unremarkable in appearance. The gallbladder is within normal limits. The pancreas and adrenal glands are unremarkable.  The kidneys are unremarkable in appearance. There is no evidence of hydronephrosis. No renal or ureteral stones are seen. No perinephric stranding is appreciated.  No free fluid is identified. The small bowel is unremarkable in appearance. The stomach is within normal limits. No acute vascular abnormalities are seen.  The appendix is normal in caliber and contains air, without evidence for appendicitis. The colon is unremarkable in appearance.  The bladder is mildly distended and grossly unremarkable in appearance. The uterus is within normal limits. The ovaries are unremarkable in appearance; a 2.1 cm cyst at the right adnexa is likely physiologic in nature. No inguinal lymphadenopathy is seen.  No acute osseous abnormalities are identified.  IMPRESSION: 1. No acute abnormality seen within the abdomen or pelvis. 2. Likely tiny hepatic cysts noted.   Electronically Signed   By: Garald Balding M.D.   On: 04/06/2013 03:57   US Abdomen Limited Ruq  04/04/2013   CLINICAL DATA:  Upper abdominal pain  EXAM: US ABDOMEN LIMITED - RIGHT UPPER QUADRANT  COMPARISON:  None.  FINDINGS: Gallbladder:  No gallstones or wall thickening visualized. There is no pericholecystic fluid. No sonographic Murphy sign noted.  Common bile duct:  Diameter: 4 mm. There is no intrahepatic or extrahepatic biliary duct dilatation.  Liver:  No focal lesion identified. Within normal limits in parenchymal echogenicity. Liver is prominent, measuring 15.4 cm in length.   IMPRESSION: Liver is prominent but otherwise normal in appearance. Study otherwise unremarkable.   Electronically Signed   By: Lowella Grip M.D.   On: 04/04/2013 10:53  I personally reviewed the imaging tests through PACS system I reviewed available ER/hospitalization records through the EMR   EKG Interpretation   None       MDM   1. Abdominal pain    Patient feels better at this time.  Tolerating oral fluids.  CT scan without acute abnormalities.  Labs without abnormalities.  Recent ultrasound without abnormalities.  Patient still has intermittent right upper quadrant pain.  She may benefit from GI followup and possible additional testing including but not limited to HIDA.     Hoy Morn, MD 04/06/13 [phone]

## 2013-04-06 NOTE — ED Notes (Signed)
Pt continues to c/o RUQ pain.  Reports continued nausea and vomiting as well.  States that she was seen and treated for gallbladder disease on Tuesday, no relief from medications.

## 2013-04-26 ENCOUNTER — Ambulatory Visit (INDEPENDENT_AMBULATORY_CARE_PROVIDER_SITE_OTHER): Payer: Self-pay | Admitting: Gastroenterology

## 2013-04-26 ENCOUNTER — Encounter: Payer: Self-pay | Admitting: Gastroenterology

## 2013-04-26 ENCOUNTER — Other Ambulatory Visit: Payer: Self-pay | Admitting: Gastroenterology

## 2013-04-26 VITALS — BP 127/87 | HR 86 | Temp 98.4°F | Ht 68.0 in | Wt 216.6 lb

## 2013-04-26 DIAGNOSIS — R195 Other fecal abnormalities: Secondary | ICD-10-CM

## 2013-04-26 DIAGNOSIS — R1011 Right upper quadrant pain: Secondary | ICD-10-CM

## 2013-04-26 MED ORDER — DICYCLOMINE HCL 10 MG PO CAPS: 10.0000 mg | ORAL_CAPSULE | Freq: Three times a day (TID) | ORAL | Status: DC

## 2013-04-26 NOTE — Progress Notes (Signed)
     Primary Care Physician:  No PCP Per Patient Primary Gastroenterologist:  Dr. Rourk   Chief Complaint  Patient presents with  . Abdominal Pain  . Emesis    HPI:   Patricia Rivers presents today after several ED visits secondary to RUQ pain. RUQ pain started in July last year. Feels constant. Worsened with eating/drinking. N/V. Eats then 5 minutes later throwing it up. Not really able to tolerate anything. Noted weight gain. Eats junk food, grapes, bananas. Ibuprofen 800 mg without help. NSAIDs for a significant amount of time prior to onset of RUQ pain. Denies reflux symptoms. Thinks she has had fevers, around 101. No melena. Month or two ago low-volume hematochezia. Loose stool, postprandial component. Lactose intolerant. Likes cheese. Loose stool past few weeks. No recent abx. About 4 loose stools per day. Notes associated cramping. Lower abdominal cramping.   Just moved from [place], [place], back to [place].    CT abd/pelvis: tiny hepatic cysts. Normal US, no gallstones.   Past Medical History  Diagnosis Date  . Medical history non-contributory     Past Surgical History  Procedure Laterality Date  . None      Current Outpatient Prescriptions  Medication Sig Dispense Refill  . HYDROcodone-acetaminophen (NORCO) 7.5-325 MG per tablet Take 1 tablet by mouth 2 (two) times daily as needed for moderate pain.      . ibuprofen (ADVIL,MOTRIN) 800 MG tablet Take 800 mg by mouth daily as needed for moderate pain.      . promethazine (PHENERGAN) 25 MG tablet Take 1 tablet (25 mg total) by mouth every 6 (six) hours as needed for nausea or vomiting.  20 tablet  0   No current facility-administered medications for this visit.    Allergies as of 04/26/2013 - Review Complete 04/26/2013  Allergen Reaction Noted  . Codeine Nausea And Vomiting 12/15/2010  . Hydromorphone hcl Nausea And Vomiting 12/15/2010  . Penicillins Hives 12/15/2010    Family History  Problem Relation Age of  Onset  . Colon cancer Maternal Grandmother   . Cervical cancer Mother     History   Social History  . Marital Status: Married    Spouse Name: N/A    Number of Children: N/A  . Years of Education: N/A   Occupational History  . Not on file.   Social History Main Topics  . Smoking status: Current Every Day Smoker -- 1.50 packs/day for 11 years    Types: Cigarettes  . Smokeless tobacco: Not on file  . Alcohol Use: Yes     Comment: occasionally  . Drug Use: No  . Sexual Activity: Not on file   Other Topics Concern  . Not on file   Social History Narrative  . No narrative on file    Review of Systems: Gen: Denies any fever, chills, fatigue, weight loss, lack of appetite.  CV: Denies chest pain, heart palpitations, peripheral edema, syncope.  Resp: Denies shortness of breath at rest or with exertion. Denies wheezing or cough.  GI: see HPI GU : Denies urinary burning, urinary frequency, urinary hesitancy MS: joint pain Derm: Denies rash, itching, dry skin Psych: +anxiety, panic attacks Heme: Denies bruising, bleeding, and enlarged lymph nodes.  Physical Exam: BP 127/87  Pulse 86  Temp(Src) 98.4 F (36.9 C) (Oral)  Ht 5' 8" (1.727 m)  Wt 216 lb 9.6 oz (98.249 kg)  BMI 32.94 kg/m2  LMP 03/14/2011 General:   Alert and oriented. Pleasant and cooperative. Well-nourished and well-developed.    Head:  Normocephalic and atraumatic. Eyes:  Without icterus, sclera clear and conjunctiva pink.  Ears:  Normal auditory acuity. Nose:  No deformity, discharge,  or lesions. Mouth:  No deformity or lesions, oral mucosa pink.  Neck:  Supple, without mass or thyromegaly. Lungs:  Clear to auscultation bilaterally. No wheezes, rales, or rhonchi. No distress.  Heart:  S1, S2 present without murmurs appreciated.  Abdomen:  +BS, soft, TTP RUQ  and non-distended. No HSM noted. No guarding or rebound. No masses appreciated.  Rectal:  Deferred  Msk:  Symmetrical without gross deformities.  Normal posture. Extremities:  Without clubbing or edema. Neurologic:  Alert and  oriented x4;  grossly normal neurologically. Skin:  Intact without significant lesions or rashes. Cervical Nodes:  No significant cervical adenopathy. Psych:  Alert and cooperative. Normal mood and affect.   Lab Results  Component Value Date   WBC 9.6 04/06/2013   HGB 14.4 04/06/2013   HCT 41.8 04/06/2013   MCV 90.5 04/06/2013   PLT 193 04/06/2013   Lab Results  Component Value Date   ALT 24 04/06/2013   AST 17 04/06/2013   ALKPHOS 84 04/06/2013   BILITOT 0.3 04/06/2013   Lab Results  Component Value Date   CREATININE 0.80 04/06/2013   BUN 13 04/06/2013   NA 138 04/06/2013   K 4.2 04/06/2013   CL 101 04/06/2013   CO2 27 04/06/2013   [place] of abdomen Jan 2015 IMPRESSION:  Liver is prominent but otherwise normal in appearance. Study  otherwise unremarkable.  CT Jan 2015:  IMPRESSION:  1. No acute abnormality seen within the abdomen or pelvis.  2. Likely tiny hepatic cysts noted.

## 2013-04-26 NOTE — Patient Instructions (Signed)
Please complete stool samples and return to the lab.  I have sent a medication called Bentyl to your pharmacy. Take this with meals and at bedtime to help with abdominal cramping and loose stool.  We have scheduled a HIDA scan to further test your gallbladder function.  You may need an upper endoscopy. You will definitely need a colonoscopy at some point. We will review the HIDA scan first.

## 2013-04-27 ENCOUNTER — Encounter (HOSPITAL_COMMUNITY): Payer: Self-pay

## 2013-04-27 ENCOUNTER — Encounter (HOSPITAL_COMMUNITY)
Admission: RE | Admit: 2013-04-27 | Discharge: 2013-04-27 | Disposition: A | Discharge status: Home / Self Care | Payer: Self-pay | Source: Ambulatory Visit | Attending: Gastroenterology | Admitting: Gastroenterology

## 2013-04-27 DIAGNOSIS — R195 Other fecal abnormalities: Secondary | ICD-10-CM

## 2013-04-27 DIAGNOSIS — R109 Unspecified abdominal pain: Secondary | ICD-10-CM | POA: Insufficient documentation

## 2013-04-27 DIAGNOSIS — R1011 Right upper quadrant pain: Secondary | ICD-10-CM

## 2013-04-27 MED ORDER — TECHNETIUM TC 99M MEBROFENIN IV KIT
5.0000 | PACK | Freq: Once | INTRAVENOUS | Status: AC | PRN
  Administered 2013-04-27: 5 via INTRAVENOUS

## 2013-04-27 MED ORDER — SINCALIDE 5 MCG IJ SOLR
INTRAMUSCULAR | Status: AC
  Administered 2013-04-27: 1.94 ug via INTRAVENOUS
  Filled 2013-04-27: qty 5

## 2013-04-27 MED ORDER — STERILE WATER FOR INJECTION IJ SOLN
INTRAMUSCULAR | Status: AC
  Administered 2013-04-27: 1.94 mL via INTRAVENOUS
  Filled 2013-04-27: qty 10

## 2013-04-30 ENCOUNTER — Telehealth: Payer: Self-pay | Admitting: General Practice

## 2013-04-30 DIAGNOSIS — R195 Other fecal abnormalities: Secondary | ICD-10-CM | POA: Insufficient documentation

## 2013-04-30 DIAGNOSIS — R1011 Right upper quadrant pain: Secondary | ICD-10-CM | POA: Insufficient documentation

## 2013-04-30 NOTE — Telephone Encounter (Signed)
Noted. I have addressed under results.

## 2013-04-30 NOTE — Progress Notes (Signed)
No pcp

## 2013-04-30 NOTE — Progress Notes (Signed)
Quick Note:  HIDA scan reviewed. She has a normal EF at 77%.  HOWEVER, she reported pain with CCK infusion.  To lay this to rest and rule out other etiology, needs EGD with Dr. Gala Romney. She will need TCS at time of EGD as well due to low-volume hematochezia. We need stool samples before scheduling these procedures; needs to turn in Cdiff, culture, and Giardia as ordered. Has she completed these? ______

## 2013-04-30 NOTE — Telephone Encounter (Signed)
Patient called and wanted to know if we had the results from her [place] she had on last week.  I expalined to the patient our policy on relaying results and it is usually within 5-7 days.  She stated she understood and would wait to her back from [place].   Routing to [employer] as a [employer]

## 2013-04-30 NOTE — Assessment & Plan Note (Signed)
Several week duration, associated low-volume hematochezia and very mild abdominal cramping. No recent abx; will still order Cdiff PCR, stool culture, and Giardia. Avoid dairy products. Add Bentyl for supportive measures. Will need TCS in near future due to low-volume hematochezia; this is likely benign in this setting. Risks and benefits discussed with patient, who stated understanding.

## 2013-04-30 NOTE — Assessment & Plan Note (Signed)
28 year old female with worsening RUQ pain, associated N/V, exacerbated by eating. Interestingly, describes weight gain despite her reports of inability to tolerate much food; husband reports patient eats "junk food". Notable NSAID intake prior to onset of RUQ pain. Thus far, [place] of abdomen, CT, and labs unrevealing. Concern for biliary dyskinesia but still unable to rule out gastritis, PUD in the setting of chronic NSAIDs.   HIDA scan in near future Possible EGD with Dr. Gala Romney if HIDA negative. If inconclusive, needs EGD regardless to rule out GI component. Risks and benefits discussed with patient at time of appt with stated understanding.

## 2013-05-02 ENCOUNTER — Telehealth: Payer: Self-pay | Admitting: Internal Medicine

## 2013-05-02 NOTE — Telephone Encounter (Signed)
Pt called to give permission for her cousin to pick up her stool containers Patricia Rivers). I do not see anything up front for her, but she said that she was ordered to do another one.

## 2013-05-03 LAB — CLOSTRIDIUM DIFFICILE BY PCR: Toxigenic C. Difficile by PCR: NOT DETECTED

## 2013-05-03 LAB — GIARDIA ANTIGEN: Giardia Screen (EIA): NEGATIVE

## 2013-05-04 ENCOUNTER — Emergency Department (HOSPITAL_COMMUNITY): Payer: Self-pay

## 2013-05-04 ENCOUNTER — Encounter (HOSPITAL_COMMUNITY): Payer: Self-pay | Admitting: Emergency Medicine

## 2013-05-04 ENCOUNTER — Emergency Department (HOSPITAL_COMMUNITY)
Admission: EM | Admit: 2013-05-04 | Discharge: 2013-05-04 | Disposition: A | Discharge status: Home / Self Care | Payer: Self-pay | Attending: Emergency Medicine | Admitting: Emergency Medicine

## 2013-05-04 DIAGNOSIS — S93409A Sprain of unspecified ligament of unspecified ankle, initial encounter: Secondary | ICD-10-CM | POA: Insufficient documentation

## 2013-05-04 DIAGNOSIS — R296 Repeated falls: Secondary | ICD-10-CM | POA: Insufficient documentation

## 2013-05-04 DIAGNOSIS — S93401A Sprain of unspecified ligament of right ankle, initial encounter: Secondary | ICD-10-CM

## 2013-05-04 DIAGNOSIS — Y929 Unspecified place or not applicable: Secondary | ICD-10-CM | POA: Insufficient documentation

## 2013-05-04 DIAGNOSIS — Y9389 Activity, other specified: Secondary | ICD-10-CM | POA: Insufficient documentation

## 2013-05-04 DIAGNOSIS — Z79899 Other long term (current) drug therapy: Secondary | ICD-10-CM | POA: Insufficient documentation

## 2013-05-04 DIAGNOSIS — F17200 Nicotine dependence, unspecified, uncomplicated: Secondary | ICD-10-CM | POA: Insufficient documentation

## 2013-05-04 DIAGNOSIS — Z880 Allergy status to penicillin: Secondary | ICD-10-CM | POA: Insufficient documentation

## 2013-05-04 MED ORDER — DICLOFENAC SODIUM 75 MG PO TBEC: 75.0000 mg | DELAYED_RELEASE_TABLET | Freq: Two times a day (BID) | ORAL | Status: DC

## 2013-05-04 MED ORDER — PROMETHAZINE HCL 12.5 MG PO TABS: 12.5000 mg | ORAL_TABLET | Freq: Four times a day (QID) | ORAL | Status: DC | PRN

## 2013-05-04 MED ORDER — HYDROCODONE-ACETAMINOPHEN 5-325 MG PO TABS: 1.0000 | ORAL_TABLET | Freq: Four times a day (QID) | ORAL | Status: DC | PRN

## 2013-05-04 MED ORDER — HYDROCODONE-ACETAMINOPHEN 5-325 MG PO TABS
1.0000 | ORAL_TABLET | Freq: Once | ORAL | Status: AC
  Administered 2013-05-04: 1 via ORAL
  Filled 2013-05-04: qty 1

## 2013-05-04 MED ORDER — PROMETHAZINE HCL 12.5 MG PO TABS
12.5000 mg | ORAL_TABLET | Freq: Once | ORAL | Status: AC
  Administered 2013-05-04: 12.5 mg via ORAL
  Filled 2013-05-04: qty 1

## 2013-05-04 MED ORDER — IBUPROFEN 800 MG PO TABS
800.0000 mg | ORAL_TABLET | Freq: Once | ORAL | Status: AC
  Administered 2013-05-04: 800 mg via ORAL
  Filled 2013-05-04: qty 1

## 2013-05-04 NOTE — ED Notes (Signed)
Rt ankle pain , after fall on ice.  Ice , elevation.  Alert, NAD.

## 2013-05-04 NOTE — ED Notes (Signed)
Fell on the ice on Monday and landed on her tail bone, fell again today on the ice and hurt her right ankle per mother.

## 2013-05-04 NOTE — Discharge Instructions (Signed)
Your x-ray is negative for fracture or dislocation. Suspect you have a bad sprain to the ankle. Please apply ice, please keep the ankle elevated on pillows above your waist. Use diclofenac 2 times daily with food. May use Norco every 4 hours if needed for pain, may also use promethazine if needed for prevention of nausea or vomiting. Please use your splint for the next 10 days. Ankle Sprain An ankle sprain is an injury to the strong, fibrous tissues (ligaments) that hold your ankle bones together.  HOME CARE   Put ice on your ankle for 1 2 days or as told by your doctor.  Put ice in a plastic bag.  Place a towel between your skin and the bag.  Leave the ice on for 15-20 minutes at a time, every 2 hours while you are awake.  Only take medicine as told by your doctor.  Raise (elevate) your injured ankle above the level of your heart as much as possible for 2 3 days.  Use crutches if your doctor tells you to. Slowly put your own weight on the affected ankle. Use the crutches until you can walk without pain.  If you have a plaster splint:  Do not rest it on anything harder than a pillow for 24 hours.  Do not put weight on it.  Do not get it wet.  Take it off to shower or bathe.  If given, use an elastic wrap or support stocking for support. Take the wrap off if your toes lose feeling (numb), tingle, or turn cold or blue.  If you have an air splint:  Add or let out air to make it comfortable.  Take it off at night and to shower and bathe.  Wiggle your toes and move your ankle up and down often while you are wearing it. GET HELP RIGHT AWAY IF:   Your toes lose feeling (numb) or turn blue.  You have severe pain that is increasing.  You have rapidly increasing bruising or puffiness (swelling).  Your toes feel very cold.  You lose feeling in your foot.  Your medicine does not help your pain. MAKE SURE YOU:   Understand these instructions.  Will watch your  condition.  Will get help right away if you are not doing well or get worse. Document Released: 08/18/2007 Document Revised: 11/24/2011 Document Reviewed: 09/13/2011 [facility] Patient Information 2014 [place], [place].  Please see the orthopedic specialist listed above if not improving.

## 2013-05-04 NOTE — ED Provider Notes (Signed)
CSN: [id number]     Arrival date & time 05/04/13  2002 History   First MD Initiated Contact with Patient 05/04/13 2008     Chief Complaint  Patient presents with  . Foot Pain     (Consider location/radiation/quality/duration/timing/severity/associated sxs/prior Treatment) HPI Comments: Patient is a 28 year old female who presents to the emergency department with right ankle pain. The patient states that she stepped on what she thought was only slow and it turned out to be a sheet of ice. The patient states that she injured her right ankle. She is concerned because she has had a fracture of this area before. The patient denies being on any blood thinning type medications. No other injury as a result of a fall today. It is of note that on February 16, the patient also fell on some ice and injured her" tailbone". The patient is not complaining of this problem today.  Patient is a 28 y.o. female presenting with lower extremity pain. The history is provided by the patient.  Foot Pain Pertinent negatives include no abdominal pain, arthralgias, chest pain, coughing or neck pain.    Past Medical History  Diagnosis Date  . Medical history non-contributory    Past Surgical History  Procedure Laterality Date  . None     Family History  Problem Relation Age of Onset  . Colon cancer Maternal Grandmother   . Cervical cancer Mother    History  Substance Use Topics  . Smoking status: Current Every Day Smoker -- 1.50 packs/day for 11 years    Types: Cigarettes  . Smokeless tobacco: Not on file  . Alcohol Use: Yes     Comment: occasionally   OB History   Grav Para Term Preterm Abortions TAB SAB Ect Mult Living                 Review of Systems  Constitutional: Negative for activity change.       All ROS Neg except as noted in HPI  HENT: Negative for nosebleeds.   Eyes: Negative for photophobia and discharge.  Respiratory: Negative for cough, shortness of breath and wheezing.    Cardiovascular: Negative for chest pain and palpitations.  Gastrointestinal: Negative for abdominal pain and blood in stool.  Genitourinary: Negative for dysuria, frequency and hematuria.  Musculoskeletal: Negative for arthralgias, back pain and neck pain.  Skin: Negative.   Neurological: Negative for dizziness, seizures and speech difficulty.  Psychiatric/Behavioral: Negative for hallucinations and confusion.      Allergies  Codeine; Hydromorphone hcl; and Penicillins  Home Medications   Current Outpatient Rx  Name  Route  Sig  Dispense  Refill  . dicyclomine (BENTYL) 10 MG capsule   Oral   Take 1 capsule (10 mg total) by mouth 4 (four) times daily -  before meals and at bedtime.   120 capsule   3   . HYDROcodone-acetaminophen (NORCO) 7.5-325 MG per tablet   Oral   Take 1 tablet by mouth 2 (two) times daily as needed for moderate pain.         [name] ibuprofen (ADVIL,MOTRIN) 800 MG tablet   Oral   Take 800 mg by mouth daily as needed for moderate pain.         . promethazine (PHENERGAN) 25 MG tablet   Oral   Take 1 tablet (25 mg total) by mouth every 6 (six) hours as needed for nausea or vomiting.   20 tablet   0    BP 143/85  Pulse  89  Temp(Src) 97.6 F (36.4 C) (Oral)  Ht 5\' 8"  (1.727 m)  Wt 214 lb (97.07 kg)  BMI 32.55 kg/m2  SpO2 100%  LMP 03/14/2011 Physical Exam  Nursing note and vitals reviewed. Constitutional: She is oriented to person, place, and time. She appears well-developed and well-nourished.  Non-toxic appearance.  HENT:  Head: Normocephalic.  Right Ear: Tympanic membrane and external ear normal.  Left Ear: Tympanic membrane and external ear normal.  Eyes: EOM and lids are normal. Pupils are equal, round, and reactive to light.  Neck: Normal range of motion. Neck supple. Carotid bruit is not present.  Cardiovascular: Normal rate, regular rhythm, normal heart sounds, intact distal pulses and normal pulses.   Pulmonary/Chest: Breath sounds  normal. No respiratory distress.  Abdominal: Soft. Bowel sounds are normal. There is no tenderness. There is no guarding.  Musculoskeletal: Normal range of motion.  There is good range of motion of the right hip and knee. There is no deformity of the tibia area. There is pain with attempted range of motion of the right ankle. There is pain and mild bruising of the lateral malleolus. There is good capillary refill of the toes. The dorsalis pedis and posterior tibial pulse is 2+.  Lymphadenopathy:       Head (right side): No submandibular adenopathy present.       Head (left side): No submandibular adenopathy present.    She has no cervical adenopathy.  Neurological: She is alert and oriented to person, place, and time. She has normal strength. No cranial nerve deficit or sensory deficit.  Skin: Skin is warm and dry.  Psychiatric: She has a normal mood and affect. Her speech is normal.    ED Course  Procedures (including critical care time) Labs Review Labs Reviewed - No data to display Imaging Review No results found.  EKG Interpretation   None       MDM Patient fell on the ice earlier today and injured the right ankle. She has not been able to apply weight to the ankle since that time. X-ray of the right ankle and foot are negative for fracture or dislocation. The plan at this time is for the patient to be placed in an ankle stirrup splint, and fitted for crutches. I've advised the patient to apply ice, keep the ankle elevated above the waist. Mother advised her to use crutches until she can safely apply weight to the right lower extremity. There is full range of motion of the right hip and knee, and no evidence of any injury or trauma involving these areas. Prescription for diclofenac and Norco given to the patient with promethazine as narcotic medications make patient nauseated.    Final diagnoses:  None    *I have reviewed nursing notes, vital signs, and all appropriate lab and  imaging results for this patient.Lenox Ahr, PA-C 05/04/13 [id number]

## 2013-05-04 NOTE — ED Provider Notes (Signed)
History/physical exam/procedure(s) were performed by non-physician practitioner and as supervising physician I was immediately available for consultation/collaboration. I have reviewed all notes and am in agreement with care and plan.   Shaune Pollack, MD 05/04/13 [phone]

## 2013-05-06 LAB — STOOL CULTURE

## 2013-05-07 ENCOUNTER — Telehealth: Payer: Self-pay

## 2013-05-07 NOTE — Telephone Encounter (Signed)
Pt is calling for stool study results to see if she needs TCS. Please advise

## 2013-05-08 ENCOUNTER — Other Ambulatory Visit: Payer: Self-pay | Admitting: Internal Medicine

## 2013-05-08 MED ORDER — PEG 3350-KCL-NA BICARB-NACL 420 G PO SOLR: 4000.0000 mL | ORAL | Status: DC

## 2013-05-08 NOTE — Progress Notes (Signed)
Quick Note:  HIDA scan reviewed. She has a normal EF at 77%.  HOWEVER, she reported pain with CCK infusion.  To lay this to rest and rule out other etiology, needs EGD with Dr. Gala Romney. She will need TCS at time of EGD as well due to low-volume hematochezia. Stool studies negative for infectious etiology.   ______

## 2013-05-08 NOTE — Telephone Encounter (Signed)
HIDA scan reviewed. She has a normal EF at 77%.  HOWEVER, she reported pain with CCK infusion.  To lay this to rest and rule out other etiology, needs EGD with Dr. Gala Romney. She will need TCS at time of EGD as well due to low-volume hematochezia. Stool studies negative for infectious etiology.   Proceed with TCS/EGD with RMR.

## 2013-05-08 NOTE — Telephone Encounter (Signed)
Leigh-Ann  Can you please get her set up for TCS and EGD

## 2013-05-08 NOTE — Telephone Encounter (Signed)
Pt called back today. i have sent a note to [facility] to see if we can go ahead and get her scheduled for a TCS and EGD

## 2013-05-08 NOTE — Telephone Encounter (Signed)
Patricia Rivers is scheduled with [facility]MR on Wednesday March 4th and she is aware and I have mailed her the instructions

## 2013-05-09 ENCOUNTER — Other Ambulatory Visit: Payer: Self-pay | Admitting: Internal Medicine

## 2013-05-09 DIAGNOSIS — R195 Other fecal abnormalities: Secondary | ICD-10-CM

## 2013-05-09 DIAGNOSIS — K921 Melena: Secondary | ICD-10-CM

## 2013-05-09 DIAGNOSIS — R1011 Right upper quadrant pain: Secondary | ICD-10-CM

## 2013-05-10 ENCOUNTER — Encounter (HOSPITAL_COMMUNITY): Payer: Self-pay

## 2013-05-14 ENCOUNTER — Encounter: Payer: Self-pay | Admitting: Orthopedic Surgery

## 2013-05-14 ENCOUNTER — Ambulatory Visit (INDEPENDENT_AMBULATORY_CARE_PROVIDER_SITE_OTHER): Payer: Self-pay | Admitting: Orthopedic Surgery

## 2013-05-14 VITALS — BP 123/76 | Ht 68.0 in | Wt 214.0 lb

## 2013-05-14 DIAGNOSIS — S93409A Sprain of unspecified ligament of unspecified ankle, initial encounter: Secondary | ICD-10-CM

## 2013-05-14 MED ORDER — HYDROCODONE-ACETAMINOPHEN 5-325 MG PO TABS: 1.0000 | ORAL_TABLET | Freq: Four times a day (QID) | ORAL | Status: DC | PRN

## 2013-05-14 NOTE — Patient Instructions (Addendum)
Contrast bath alternate warm and cold for 5 minutes each for 30 minutes 3 x a day   Start weight bearing with crutches and progress to weight bearing without crutches   Wear brace x 6 weeks

## 2013-05-14 NOTE — Progress Notes (Signed)
  Subjective:    Patricia Rivers is a 28 y.o. female who presents with right ankle pain. Onset of the symptoms was 2 weeks ago. Inciting event: inverted while slipping on ice . Current symptoms include: inability to bear weight, pain with inversion of the foot, stiffness and swelling. Aggravating factors: direct pressure, standing, walking  and weight bearing. Symptoms have stabilized. Patient has had no prior ankle problems. Evaluation to date: plain films: normal. Treatment to date: avoidance of offending activity, brace which is somewhat effective, ice, rest and crutches . The following portions of the patient's history were reviewed and updated as appropriate: allergies, current medications, past family history, past medical history, past social history, past surgical history and problem list.    Objective:    BP 123/76  Ht 5\' 8"  (1.727 m)  Wt 214 lb (97.07 kg)  BMI 32.55 kg/m2  General appearance is normal, the patient is alert and oriented x3 with normal mood and affect. GAIT NWB WITH CRUTCHES   Right ankle:   ecchymosis noted in the foot ankle+ effusion noted laterally positive findings: decreased dorsiflexion, decreased range of motion, ligamentous laxity ATFL and tenderness over ATFL ligament  Left ankle:   normal  NEURO VASCULAR EXAM IS NORMAL   Imaging: X-ray of the right ankle(s): no fracture, dislocation, swelling or degenerative changes noted    Assessment:    Ankle sprain    Plan:    Rest, ice, compression, elevation (RICE) therapy. Crutches and instructions provided. Home exercise plan outlined. PRN FOLLOW UP  USE BRACE 6 WEEKS  PROGRESSIVE WEIGHT BEARING WITH CRUTCHES

## 2013-05-16 ENCOUNTER — Encounter (HOSPITAL_COMMUNITY)
Admission: RE | Disposition: A | Discharge status: Home / Self Care | Payer: Self-pay | Source: Ambulatory Visit | Attending: Internal Medicine

## 2013-05-16 ENCOUNTER — Ambulatory Visit (HOSPITAL_COMMUNITY)
Admission: RE | Admit: 2013-05-16 | Discharge: 2013-05-16 | Disposition: A | Discharge status: Home / Self Care | Payer: Self-pay | Source: Ambulatory Visit | Attending: Internal Medicine | Admitting: Internal Medicine

## 2013-05-16 ENCOUNTER — Encounter (HOSPITAL_COMMUNITY): Payer: Self-pay | Admitting: *Deleted

## 2013-05-16 DIAGNOSIS — C20 Malignant neoplasm of rectum: Secondary | ICD-10-CM

## 2013-05-16 DIAGNOSIS — D3A026 Benign carcinoid tumor of the rectum: Secondary | ICD-10-CM | POA: Insufficient documentation

## 2013-05-16 DIAGNOSIS — K2940 Chronic atrophic gastritis without bleeding: Secondary | ICD-10-CM | POA: Insufficient documentation

## 2013-05-16 DIAGNOSIS — D126 Benign neoplasm of colon, unspecified: Secondary | ICD-10-CM

## 2013-05-16 DIAGNOSIS — R1011 Right upper quadrant pain: Secondary | ICD-10-CM

## 2013-05-16 DIAGNOSIS — K921 Melena: Secondary | ICD-10-CM

## 2013-05-16 DIAGNOSIS — R195 Other fecal abnormalities: Secondary | ICD-10-CM

## 2013-05-16 HISTORY — DX: Unspecified injury of right ankle, initial encounter: S99.911A

## 2013-05-16 HISTORY — PX: COLONOSCOPY WITH ESOPHAGOGASTRODUODENOSCOPY (EGD): SHX5779

## 2013-05-16 SURGERY — COLONOSCOPY WITH ESOPHAGOGASTRODUODENOSCOPY (EGD)
Anesthesia: Moderate Sedation

## 2013-05-16 MED ORDER — SPOT INK MARKER SYRINGE KIT
PACK | SUBMUCOSAL | Status: DC | PRN
  Administered 2013-05-16: 1 mL via SUBMUCOSAL

## 2013-05-16 MED ORDER — MEPERIDINE HCL 100 MG/ML IJ SOLN
INTRAMUSCULAR | Status: AC
  Filled 2013-05-16: qty 2

## 2013-05-16 MED ORDER — MIDAZOLAM HCL 5 MG/5ML IJ SOLN
INTRAMUSCULAR | Status: DC | PRN
  Administered 2013-05-16 (×3): 1 mg via INTRAVENOUS
  Administered 2013-05-16 (×2): 2 mg via INTRAVENOUS
  Administered 2013-05-16 (×2): 1 mg via INTRAVENOUS
  Administered 2013-05-16 (×2): 2 mg via INTRAVENOUS
  Administered 2013-05-16: 1 mg via INTRAVENOUS

## 2013-05-16 MED ORDER — ONDANSETRON HCL 4 MG/2ML IJ SOLN
INTRAMUSCULAR | Status: AC
  Filled 2013-05-16: qty 2

## 2013-05-16 MED ORDER — MIDAZOLAM HCL 5 MG/5ML IJ SOLN
INTRAMUSCULAR | Status: AC
  Filled 2013-05-16: qty 5

## 2013-05-16 MED ORDER — MEPERIDINE HCL 100 MG/ML IJ SOLN
INTRAMUSCULAR | Status: DC | PRN
  Administered 2013-05-16 (×4): 50 mg via INTRAVENOUS

## 2013-05-16 MED ORDER — MIDAZOLAM HCL 5 MG/5ML IJ SOLN
INTRAMUSCULAR | Status: AC
  Filled 2013-05-16: qty 10

## 2013-05-16 MED ORDER — SODIUM CHLORIDE 0.9 % IV SOLN
INTRAVENOUS | Status: DC
  Administered 2013-05-16: 11:00:00 via INTRAVENOUS

## 2013-05-16 MED ORDER — SIMETHICONE 40 MG/0.6ML PO SUSP
ORAL | Status: DC | PRN
  Administered 2013-05-16: 13:00:00

## 2013-05-16 MED ORDER — LIDOCAINE VISCOUS 2 % MT SOLN
OROMUCOSAL | Status: AC
  Filled 2013-05-16: qty 15

## 2013-05-16 MED ORDER — ONDANSETRON HCL 4 MG/2ML IJ SOLN
INTRAMUSCULAR | Status: DC | PRN
  Administered 2013-05-16: 4 mg via INTRAVENOUS

## 2013-05-16 MED ORDER — SODIUM CHLORIDE 0.9 % IV SOLN
INTRAVENOUS | Status: DC | PRN
  Administered 2013-05-16: 1000 mL via INTRAMUSCULAR

## 2013-05-16 MED ORDER — LIDOCAINE VISCOUS 2 % MT SOLN
OROMUCOSAL | Status: DC | PRN
  Administered 2013-05-16 (×2): 3 mL via OROMUCOSAL

## 2013-05-16 NOTE — Discharge Instructions (Addendum)
Colonoscopy Discharge Instructions  Read the instructions outlined below and refer to this sheet in the next few weeks. These discharge instructions provide you with general information on caring for yourself after you leave the hospital. Your doctor may also give you specific instructions. While your treatment has been planned according to the most current medical practices available, unavoidable complications occasionally occur. If you have any problems or questions after discharge, call Dr. Gala Romney at [phone]. ACTIVITY  You may resume your regular activity, but move at a slower pace for the next 24 hours.   Take frequent rest periods for the next 24 hours.   Walking will help get rid of the air and reduce the bloated feeling in your belly (abdomen).   No driving for 24 hours (because of the medicine (anesthesia) used during the test).    Do not sign any important legal documents or operate any machinery for 24 hours (because of the anesthesia used during the test).  NUTRITION  Drink plenty of fluids.   You may resume your normal diet as instructed by your doctor.   Begin with a light meal and progress to your normal diet. Heavy or fried foods are harder to digest and may make you feel sick to your stomach (nauseated).   Avoid alcoholic beverages for 24 hours or as instructed.  MEDICATIONS  You may resume your normal medications unless your doctor tells you otherwise.  WHAT YOU CAN EXPECT TODAY  Some feelings of bloating in the abdomen.   Passage of more gas than usual.   Spotting of blood in your stool or on the toilet paper.  IF YOU HAD POLYPS REMOVED DURING THE COLONOSCOPY:  No aspirin products for 7 days or as instructed.   No alcohol for 7 days or as instructed.   Eat a soft diet for the next 24 hours.  FINDING OUT THE RESULTS OF YOUR TEST Not all test results are available during your visit. If your test results are not back during the visit, make an appointment  with your caregiver to find out the results. Do not assume everything is normal if you have not heard from your caregiver or the medical facility. It is important for you to follow up on all of your test results.  SEEK IMMEDIATE MEDICAL ATTENTION IF:  You have more than a spotting of blood in your stool.   Your belly is swollen (abdominal distention).   You are nauseated or vomiting.   You have a temperature over 101.   You have abdominal pain or discomfort that is severe or gets worse throughout the day. EGD Discharge instructions Please read the instructions outlined below and refer to this sheet in the next few weeks. These discharge instructions provide you with general information on caring for yourself after you leave the hospital. Your doctor may also give you specific instructions. While your treatment has been planned according to the most current medical practices available, unavoidable complications occasionally occur. If you have any problems or questions after discharge, please call your doctor. ACTIVITY You may resume your regular activity but move at a slower pace for the next 24 hours.  Take frequent rest periods for the next 24 hours.  Walking will help expel (get rid of) the air and reduce the bloated feeling in your abdomen.  No driving for 24 hours (because of the anesthesia (medicine) used during the test).  You may shower.  Do not sign any important legal documents or operate any machinery for  24 hours (because of the anesthesia used during the test).  NUTRITION Drink plenty of fluids.  You may resume your normal diet.  Begin with a light meal and progress to your normal diet.  Avoid alcoholic beverages for 24 hours or as instructed by your caregiver.  MEDICATIONS You may resume your normal medications unless your caregiver tells you otherwise.  WHAT YOU CAN EXPECT TODAY You may experience abdominal discomfort such as a feeling of fullness or gas pains.   FOLLOW-UP Your doctor will discuss the results of your test with you.  SEEK IMMEDIATE MEDICAL ATTENTION IF ANY OF THE FOLLOWING OCCUR: Excessive nausea (feeling sick to your stomach) and/or vomiting.  Severe abdominal pain and distention (swelling).  Trouble swallowing.  Temperature over 101 F (37.8 C).  Rectal bleeding or vomiting of blood.    Increase Prilosec to 20 mg twice daily  Stop ibuprofen and all nonsteroidal agents entirely  Polyp information provided  Further recommendations to follow pending review of pathology report   Colon Polyps Polyps are lumps of extra tissue growing inside the body. Polyps can grow in the large intestine (colon). Most colon polyps are noncancerous (benign). However, some colon polyps can become cancerous over time. Polyps that are larger than a pea may be harmful. To be safe, caregivers remove and test all polyps. CAUSES  Polyps form when mutations in the genes cause your cells to grow and divide even though no more tissue is needed. RISK FACTORS There are a number of risk factors that can increase your chances of getting colon polyps. They include:  Being older than 50 years.  Family history of colon polyps or colon cancer.  Long-term colon diseases, such as colitis or Crohn disease.  Being overweight.  Smoking.  Being inactive.  Drinking too much alcohol. SYMPTOMS  Most small polyps do not cause symptoms. If symptoms are present, they may include:  Blood in the stool. The stool may look dark red or black.  Constipation or diarrhea that lasts longer than 1 week. DIAGNOSIS People often do not know they have polyps until their caregiver finds them during a regular checkup. Your caregiver can use 4 tests to check for polyps:  Digital rectal exam. The caregiver wears gloves and feels inside the rectum. This test would find polyps only in the rectum.  Barium enema. The caregiver puts a liquid called barium into your rectum before  taking X-rays of your colon. Barium makes your colon look white. Polyps are dark, so they are easy to see in the X-ray pictures.  Sigmoidoscopy. A thin, flexible tube (sigmoidoscope) is placed into your rectum. The sigmoidoscope has a light and tiny camera in it. The caregiver uses the sigmoidoscope to look at the last third of your colon.  Colonoscopy. This test is like sigmoidoscopy, but the caregiver looks at the entire colon. This is the most common method for finding and removing polyps. TREATMENT  Any polyps will be removed during a sigmoidoscopy or colonoscopy. The polyps are then tested for cancer. PREVENTION  To help lower your risk of getting more colon polyps:  Eat plenty of fruits and vegetables. Avoid eating fatty foods.  Do not smoke.  Avoid drinking alcohol.  Exercise every day.  Lose weight if recommended by your caregiver.  Eat plenty of calcium and folate. Foods that are rich in calcium include milk, cheese, and broccoli. Foods that are rich in folate include chickpeas, kidney beans, and spinach. HOME CARE INSTRUCTIONS Keep all follow-up appointments as directed  by your caregiver. You may need periodic exams to check for polyps. SEEK MEDICAL CARE IF: You notice bleeding during a bowel movement.

## 2013-05-16 NOTE — Op Note (Signed)
[place], [id number]   ENDOSCOPY PROCEDURE REPORT  PATIENT: Ardine, Iacovelli  MR#: [id number] BIRTHDATE: January 27, 1986 , 28  yrs. old GENDER: Female ENDOSCOPIST: R.  Garfield Cornea, MD FACP [facility] REFERRED BY:     none PROCEDURE DATE:  05/16/2013 PROCEDURE:     EGD with gastric biopsy  INDICATIONS:     Right upper quadrant abdominal  INFORMED CONSENT:   The risks, benefits, limitations, alternatives and imponderables have been discussed.  The potential for biopsy, esophogeal dilation, etc. have also been reviewed.  Questions have been answered.  All parties agreeable.  Please see the history and physical in the medical record for more information.  MEDICATIONS:  Versed 10 mg IV and Demerol 200 mg IV in divided doses. Zofran 4 mg IV. Xylocaine gel  DESCRIPTION OF PROCEDURE:   The EG-[id number] ([id number])  endoscope was introduced through the mouth and advanced to the second portion of the duodenum without difficulty or limitations.  The mucosal surfaces were surveyed very carefully during advancement of the scope and upon withdrawal.  Retroflexion view of the proximal stomach and esophagogastric junction was performed.      FINDINGS:  Normal esophagus. Stomach empty. Scattered fine antral erosions. No ulcer or infiltrating process. Patent pylorus. Examination bulb and second portion revealed numerous 1-2 mm erosions and superficial areas of ulceration with surrounding erythema. Second portion appeared normal.  THERAPEUTIC / DIAGNOSTIC MANEUVERS PERFORMED:  Biopsies of the gastric mucosa taken to assess for H. pylori.   COMPLICATIONS:  None  IMPRESSION:   Antral erosions. Duodenal bulbar erosions andsuperficial areas of ulceration -status post antral biopsy  RECOMMENDATIONS:  Take Prilosec 20 mg twice daily everyday for the time being. Avoid all forms of nonsteroidal agents including ibuprofen. Further recommendations to follow pending  review of pathology report. See colonoscopy report.    _______________________________ R. Garfield Cornea, MD FACP [facility] eSigned:  R. Garfield Cornea, MD Rosalita Chessman Marval Regal 2013-05-16 [id number]     CC:

## 2013-05-16 NOTE — Interval H&P Note (Signed)
History and Physical Interval Note:  05/16/2013 12:52 PM  Patricia Rivers  has presented today for surgery, with the diagnosis of RUQ PAIN , LOOSE STOOLS LOW VOLUME HEMATOCHEZIA  The various methods of treatment have been discussed with the patient and family. After consideration of risks, benefits and other options for treatment, the patient has consented to  Procedure(s) with comments: COLONOSCOPY WITH ESOPHAGOGASTRODUODENOSCOPY (EGD) (N/A) - 12:30 as a surgical intervention .  The patient's history has been reviewed, patient examined, no change in status, stable for surgery.  I have reviewed the patient's chart and labs.  Questions were answered to the patient's satisfaction.     No change. EGD colonoscopy per plan.The risks, benefits, limitations, imponderables and alternatives regarding both EGD and colonoscopy have been reviewed with the patient. Questions have been answered. All parties agreeable.   Manus Rudd

## 2013-05-16 NOTE — H&P (View-Only) (Signed)
Primary Care Physician:  No PCP Per Patient Primary Gastroenterologist:  Dr. Gala Romney   Chief Complaint  Patient presents with  . Abdominal Pain  . Emesis    HPI:   Patricia Rivers presents today after several ED visits secondary to RUQ pain. RUQ pain started in July last year. Feels constant. Worsened with eating/drinking. N/V. Eats then 5 minutes later throwing it up. Not really able to tolerate anything. Noted weight gain. Eats junk food, grapes, bananas. Ibuprofen 800 mg without help. NSAIDs for a significant amount of time prior to onset of RUQ pain. Denies reflux symptoms. Thinks she has had fevers, around 101. No melena. Month or two ago low-volume hematochezia. Loose stool, postprandial component. Lactose intolerant. Likes cheese. Loose stool past few weeks. No recent abx. About 4 loose stools per day. Notes associated cramping. Lower abdominal cramping.   Just moved from [place], [place], back to [place].    CT abd/pelvis: tiny hepatic cysts. Normal [place], no gallstones.   Past Medical History  Diagnosis Date  . Medical history non-contributory     Past Surgical History  Procedure Laterality Date  . None      Current Outpatient Prescriptions  Medication Sig Dispense Refill  . HYDROcodone-acetaminophen (NORCO) 7.5-325 MG per tablet Take 1 tablet by mouth 2 (two) times daily as needed for moderate pain.      [name] ibuprofen (ADVIL,MOTRIN) 800 MG tablet Take 800 mg by mouth daily as needed for moderate pain.      . promethazine (PHENERGAN) 25 MG tablet Take 1 tablet (25 mg total) by mouth every 6 (six) hours as needed for nausea or vomiting.  20 tablet  0   No current facility-administered medications for this visit.    Allergies as of 04/26/2013 - Review Complete 04/26/2013  Allergen Reaction Noted  . Codeine Nausea And Vomiting 12/15/2010  . Hydromorphone hcl Nausea And Vomiting 12/15/2010  . Penicillins Hives 12/15/2010    Family History  Problem Relation Age of  Onset  . Colon cancer Maternal Grandmother   . Cervical cancer Mother     History   Social History  . Marital Status: Married    Spouse Name: N/A    Number of Children: N/A  . Years of Education: N/A   Occupational History  . Not on file.   Social History Main Topics  . Smoking status: Current Every Day Smoker -- 1.50 packs/day for 11 years    Types: Cigarettes  . Smokeless tobacco: Not on file  . Alcohol Use: Yes     Comment: occasionally  . Drug Use: No  . Sexual Activity: Not on file   Other Topics Concern  . Not on file   Social History Narrative  . No narrative on file    Review of Systems: Gen: Denies any fever, chills, fatigue, weight loss, lack of appetite.  CV: Denies chest pain, heart palpitations, peripheral edema, syncope.  Resp: Denies shortness of breath at rest or with exertion. Denies wheezing or cough.  GI: see HPI GU : Denies urinary burning, urinary frequency, urinary hesitancy MS: joint pain Derm: Denies rash, itching, dry skin Psych: +anxiety, panic attacks Heme: Denies bruising, bleeding, and enlarged lymph nodes.  Physical Exam: BP 127/87  Pulse 86  Temp(Src) 98.4 F (36.9 C) (Oral)  Ht 5\' 8"  (1.727 m)  Wt 216 lb 9.6 oz (98.249 kg)  BMI 32.94 kg/m2  LMP 03/14/2011 General:   Alert and oriented. Pleasant and cooperative. Well-nourished and well-developed.  Head:  Normocephalic and atraumatic. Eyes:  Without icterus, sclera clear and conjunctiva pink.  Ears:  Normal auditory acuity. Nose:  No deformity, discharge,  or lesions. Mouth:  No deformity or lesions, oral mucosa pink.  Neck:  Supple, without mass or thyromegaly. Lungs:  Clear to auscultation bilaterally. No wheezes, rales, or rhonchi. No distress.  Heart:  S1, S2 present without murmurs appreciated.  Abdomen:  +BS, soft, TTP RUQ  and non-distended. No HSM noted. No guarding or rebound. No masses appreciated.  Rectal:  Deferred  Msk:  Symmetrical without gross deformities.  Normal posture. Extremities:  Without clubbing or edema. Neurologic:  Alert and  oriented x4;  grossly normal neurologically. Skin:  Intact without significant lesions or rashes. Cervical Nodes:  No significant cervical adenopathy. Psych:  Alert and cooperative. Normal mood and affect.   Lab Results  Component Value Date   WBC 9.6 04/06/2013   HGB 14.4 04/06/2013   HCT 41.8 04/06/2013   MCV 90.5 04/06/2013   PLT 193 04/06/2013   Lab Results  Component Value Date   ALT 24 04/06/2013   AST 17 04/06/2013   ALKPHOS 84 04/06/2013   BILITOT 0.3 04/06/2013   Lab Results  Component Value Date   CREATININE 0.80 04/06/2013   BUN 13 04/06/2013   NA 138 04/06/2013   K 4.2 04/06/2013   CL 101 04/06/2013   CO2 27 04/06/2013   [place] of abdomen Jan 2015 IMPRESSION:  Liver is prominent but otherwise normal in appearance. Study  otherwise unremarkable.  CT Jan 2015:  IMPRESSION:  1. No acute abnormality seen within the abdomen or pelvis.  2. Likely tiny hepatic cysts noted.

## 2013-05-16 NOTE — Op Note (Signed)
[facility] [address] [place], [address]   COLONOSCOPY PROCEDURE REPORT  PATIENT: Patricia, Rivers  MR#:         [id number] BIRTHDATE: 1985/09/25 , 28  yrs. old GENDER: Female ENDOSCOPIST: RGarfield Cornea, MD FACP [facility] REFERRED BY:     none PROCEDURE DATE:  05/16/2013 PROCEDURE:     Ileocolonoscopy with snare polypectomy and biopsy  INDICATIONS:  hematochezia  INFORMED CONSENT:  The risks, benefits, alternatives and imponderables including but not limited to bleeding, perforation as well as the possibility of a missed lesion have been reviewed.  The potential for biopsy, lesion removal, etc. have also been discussed.  Questions have been answered.  All parties agreeable. Please see the history and physical in the medical record for more information.  MEDICATIONS: Versed 14 mg IV and Demerol 200 mg IV in divided doses. Zofran 4 mg IV  DESCRIPTION OF PROCEDURE:  After a digital rectal exam was performed, the EC-[id number] ([id number])  colonoscope was advanced from the anus through the rectum and colon to the area of the cecum, ileocecal valve and appendiceal orifice.  The cecum was deeply intubated.  These structures were well-seen and photographed for the record.  From the level of the cecum and ileocecal valve, the scope was slowly and cautiously withdrawn.  The mucosal surfaces were carefully surveyed utilizing scope tip deflection to facilitate fold flattening as needed.  The scope was pulled down into the rectum where a thorough examination including retroflexion was performed.    FINDINGS:  Adequate preparation. Single diminutive polyp with a pale surfac and 8 cm from the anal verge; otherwise, the remainder of rectal mucosa appeared normal.  In the mid descending segment there wasa .5 cm pedunculated polyp; the remainder of the colonic mucosa appeared normal. The distal 5 cm of terminal ileum mucosa also appeared normal.    THERAPEUTIC /  DIAGNOSTIC MANEUVERS PERFORMED:  The descending colon polyp was removed cleanly with 2 passes of  hot snare cautery. The site was subsequently  tattood. The rectal polyp was cold biopsied and felt to have been removed completely.  COMPLICATIONS: None  CECAL WITHDRAWAL TIME:  18 minute  IMPRESSION:  Rectal and colonic polyps removed as described above. Large and descending colon polyp site tattooed  RECOMMENDATIONS: Followup on pathology. Avoid all NSAIDs. See EGD report   _______________________________ eSigned:  R. Garfield Cornea, MD FACP [facility] 05/16/2013 1:56 PM   CC:    PATIENT NAME:  Patricia, Rivers MR#: [id number]

## 2013-05-18 ENCOUNTER — Telehealth: Payer: Self-pay | Admitting: Internal Medicine

## 2013-05-18 NOTE — Telephone Encounter (Signed)
Olivia Mackie in short stay called to tell me to report she called this pt to check on her this afternoon. Pt reports she has noted some blood coming out of her rectum since 13:00 today.  No abdominal pain.  Pt had a 1.5 cm desc colon polyp removed via hot snare 2 days ago; Also, pt had a rectal polyp cold bx'd.   Pt otherwise w/o symptoms by report. I advised to drop back to a clear liquid diet for now and avoid all asa, nsaids for now; If bleeding tapers off, will f/u with her next week; if bleeding does not taper off or it worsens, recommendations made for her to present herself to the ED for further evaluation.

## 2013-05-18 NOTE — Progress Notes (Signed)
1555  C=Follow up call made to check on pt 48 hours post procedure..  Pt states that she is bleeding "like a menstrual period".  She has placed a pad to monitor bleeding.  She no longer had a period.  She had a rectal and descending colon polyp removed on 05/16/2013.  Dr. Gala Romney notified.  Pt instructed to stop all Aspirin and Advil.  Clear liquid diet.  Continue to monitor bleeding.  If bleeding continues or becomes worse, come to the ER as soon as possible.

## 2013-05-20 ENCOUNTER — Encounter (HOSPITAL_COMMUNITY): Payer: Self-pay | Admitting: Emergency Medicine

## 2013-05-20 ENCOUNTER — Emergency Department (HOSPITAL_COMMUNITY): Payer: Self-pay

## 2013-05-20 ENCOUNTER — Emergency Department (HOSPITAL_COMMUNITY)
Admission: EM | Admit: 2013-05-20 | Discharge: 2013-05-20 | Disposition: A | Discharge status: Home / Self Care | Payer: Self-pay | Attending: Emergency Medicine | Admitting: Emergency Medicine

## 2013-05-20 DIAGNOSIS — F17200 Nicotine dependence, unspecified, uncomplicated: Secondary | ICD-10-CM | POA: Insufficient documentation

## 2013-05-20 DIAGNOSIS — R300 Dysuria: Secondary | ICD-10-CM | POA: Insufficient documentation

## 2013-05-20 DIAGNOSIS — Z860100 Personal history of colon polyps, unspecified: Secondary | ICD-10-CM | POA: Insufficient documentation

## 2013-05-20 DIAGNOSIS — Z91040 Latex allergy status: Secondary | ICD-10-CM | POA: Insufficient documentation

## 2013-05-20 DIAGNOSIS — Z87820 Personal history of traumatic brain injury: Secondary | ICD-10-CM | POA: Insufficient documentation

## 2013-05-20 DIAGNOSIS — R1110 Vomiting, unspecified: Secondary | ICD-10-CM | POA: Insufficient documentation

## 2013-05-20 DIAGNOSIS — R109 Unspecified abdominal pain: Secondary | ICD-10-CM | POA: Insufficient documentation

## 2013-05-20 DIAGNOSIS — Z79899 Other long term (current) drug therapy: Secondary | ICD-10-CM | POA: Insufficient documentation

## 2013-05-20 DIAGNOSIS — Z87828 Personal history of other (healed) physical injury and trauma: Secondary | ICD-10-CM | POA: Insufficient documentation

## 2013-05-20 DIAGNOSIS — Z880 Allergy status to penicillin: Secondary | ICD-10-CM | POA: Insufficient documentation

## 2013-05-20 HISTORY — DX: Rectal polyp: K62.1

## 2013-05-20 HISTORY — DX: Polyp of colon: K63.5

## 2013-05-20 LAB — COMPREHENSIVE METABOLIC PANEL
ALT: 20 U/L (ref 0–35)
AST: 15 U/L (ref 0–37)
Albumin: 3.9 g/dL (ref 3.5–5.2)
Alkaline Phosphatase: 85 U/L (ref 39–117)
BUN: 14 mg/dL (ref 6–23)
CALCIUM: 9.6 mg/dL (ref 8.4–10.5)
CO2: 25 meq/L (ref 19–32)
Chloride: 104 mEq/L (ref 96–112)
Creatinine, Ser: 0.85 mg/dL (ref 0.50–1.10)
GFR calc Af Amer: >90 mL/min (ref >90)
GFR calc non Af Amer: >90 mL/min (ref >90)
Glucose, Bld: 92 mg/dL (ref 70–99)
Potassium: 4.1 mEq/L (ref 3.7–5.3)
SODIUM: 140 meq/L (ref 137–147)
TOTAL PROTEIN: 7.2 g/dL (ref 6.0–8.3)
Total Bilirubin: 0.3 mg/dL (ref 0.3–1.2)

## 2013-05-20 LAB — CBC WITH DIFFERENTIAL/PLATELET
Basophils Absolute: 0 10*3/uL (ref 0.0–0.1)
Basophils Relative: 0 % (ref 0–1)
EOS ABS: 0.5 10*3/uL (ref 0.0–0.7)
EOS PCT: 5 % (ref 0–5)
HCT: 40.8 % (ref 36.0–46.0)
Hemoglobin: 14.4 g/dL (ref 12.0–15.0)
LYMPHS PCT: 31 % (ref 12–46)
Lymphs Abs: 2.9 10*3/uL (ref 0.7–4.0)
MCH: 31.4 pg (ref 26.0–34.0)
MCHC: 35.3 g/dL (ref 30.0–36.0)
MCV: 89.1 fL (ref 78.0–100.0)
Monocytes Absolute: 0.5 10*3/uL (ref 0.1–1.0)
Monocytes Relative: 6 % (ref 3–12)
Neutro Abs: 5.3 10*3/uL (ref 1.7–7.7)
Neutrophils Relative %: 58 % (ref 43–77)
PLATELETS: 197 10*3/uL (ref 150–400)
RBC: 4.58 MIL/uL (ref 3.87–5.11)
RDW: 12.3 % (ref 11.5–15.5)
WBC: 9.2 10*3/uL (ref 4.0–10.5)

## 2013-05-20 LAB — URINALYSIS, ROUTINE W REFLEX MICROSCOPIC
Bilirubin Urine: NEGATIVE
GLUCOSE, UA: NEGATIVE mg/dL
Hgb urine dipstick: NEGATIVE
Ketones, ur: NEGATIVE mg/dL
Leukocytes, UA: NEGATIVE
Nitrite: NEGATIVE
Protein, ur: NEGATIVE mg/dL
Urobilinogen, UA: 0.2 mg/dL (ref 0.0–1.0)
pH: 6 (ref 5.0–8.0)

## 2013-05-20 LAB — LIPASE, BLOOD: Lipase: 31 U/L (ref 11–59)

## 2013-05-20 MED ORDER — HYDROMORPHONE HCL PF 1 MG/ML IJ SOLN
1.0000 mg | Freq: Once | INTRAMUSCULAR | Status: DC
  Filled 2013-05-20: qty 1

## 2013-05-20 MED ORDER — OXYCODONE-ACETAMINOPHEN 5-325 MG PO TABS: 2.0000 | ORAL_TABLET | ORAL | Status: DC | PRN

## 2013-05-20 MED ORDER — IOHEXOL 300 MG/ML  SOLN
50.0000 mL | Freq: Once | INTRAMUSCULAR | Status: AC | PRN
  Administered 2013-05-20: 50 mL via ORAL

## 2013-05-20 MED ORDER — SODIUM CHLORIDE 0.9 % IV BOLUS (SEPSIS)
1000.0000 mL | Freq: Once | INTRAVENOUS | Status: AC
  Administered 2013-05-20: 1000 mL via INTRAVENOUS

## 2013-05-20 MED ORDER — IOHEXOL 300 MG/ML  SOLN
100.0000 mL | Freq: Once | INTRAMUSCULAR | Status: AC | PRN
  Administered 2013-05-20: 50 mL via INTRAVENOUS

## 2013-05-20 MED ORDER — SODIUM CHLORIDE 0.9 % IV SOLN: INTRAVENOUS | Status: DC

## 2013-05-20 MED ORDER — MORPHINE SULFATE 4 MG/ML IJ SOLN
4.0000 mg | Freq: Once | INTRAMUSCULAR | Status: AC
  Administered 2013-05-20: 4 mg via INTRAVENOUS
  Filled 2013-05-20: qty 1

## 2013-05-20 MED ORDER — ONDANSETRON HCL 4 MG/2ML IJ SOLN
4.0000 mg | Freq: Once | INTRAMUSCULAR | Status: AC
  Administered 2013-05-20: 4 mg via INTRAVENOUS
  Filled 2013-05-20: qty 2

## 2013-05-20 MED ORDER — MORPHINE SULFATE 4 MG/ML IJ SOLN
INTRAMUSCULAR | Status: AC
  Administered 2013-05-20: 4 mg
  Filled 2013-05-20: qty 1

## 2013-05-20 NOTE — Discharge Instructions (Signed)
Abdominal Pain, Women °Abdominal (stomach, pelvic, or belly) pain can be caused by many things. It is important to tell your doctor: °· The location of the pain. °· Does it come and go or is it present all the time? °· Are there things that start the pain (eating certain foods, exercise)? °· Are there other symptoms associated with the pain (fever, nausea, vomiting, diarrhea)? °All of this is helpful to know when trying to find the cause of the pain. °CAUSES  °· Stomach: virus or bacteria infection, or ulcer. °· Intestine: appendicitis (inflamed appendix), regional ileitis (Crohn's disease), ulcerative colitis (inflamed colon), irritable bowel syndrome, diverticulitis (inflamed diverticulum of the colon), or cancer of the stomach or intestine. °· Gallbladder disease or stones in the gallbladder. °· Kidney disease, kidney stones, or infection. °· Pancreas infection or cancer. °· Fibromyalgia (pain disorder). °· Diseases of the female organs: °· Uterus: fibroid (non-cancerous) tumors or infection. °· Fallopian tubes: infection or tubal pregnancy. °· Ovary: cysts or tumors. °· Pelvic adhesions (scar tissue). °· Endometriosis (uterus lining tissue growing in the pelvis and on the pelvic organs). °· Pelvic congestion syndrome (female organs filling up with blood just before the menstrual period). °· Pain with the menstrual period. °· Pain with ovulation (producing an egg). °· Pain with an IUD (intrauterine device, birth control) in the uterus. °· Cancer of the female organs. °· Functional pain (pain not caused by a disease, may improve without treatment). °· Psychological pain. °· Depression. °DIAGNOSIS  °Your doctor will decide the seriousness of your pain by doing an examination. °· Blood tests. °· X-rays. °· Ultrasound. °· CT scan (computed tomography, special type of X-ray). °· MRI (magnetic resonance imaging). °· Cultures, for infection. °· Barium enema (dye inserted in the large intestine, to better view it with  X-rays). °· Colonoscopy (looking in intestine with a lighted tube). °· Laparoscopy (minor surgery, looking in abdomen with a lighted tube). °· Major abdominal exploratory surgery (looking in abdomen with a large incision). °TREATMENT  °The treatment will depend on the cause of the pain.  °· Many cases can be observed and treated at home. °· Over-the-counter medicines recommended by your caregiver. °· Prescription medicine. °· Antibiotics, for infection. °· Birth control pills, for painful periods or for ovulation pain. °· Hormone treatment, for endometriosis. °· Nerve blocking injections. °· Physical therapy. °· Antidepressants. °· Counseling with a psychologist or psychiatrist. °· Minor or major surgery. °HOME CARE INSTRUCTIONS  °· Do not take laxatives, unless directed by your caregiver. °· Take over-the-counter pain medicine only if ordered by your caregiver. Do not take aspirin because it can cause an upset stomach or bleeding. °· Try a clear liquid diet (broth or water) as ordered by your caregiver. Slowly move to a bland diet, as tolerated, if the pain is related to the stomach or intestine. °· Have a thermometer and take your temperature several times a day, and record it. °· Bed rest and sleep, if it helps the pain. °· Avoid sexual intercourse, if it causes pain. °· Avoid stressful situations. °· Keep your follow-up appointments and tests, as your caregiver orders. °· If the pain does not go away with medicine or surgery, you may try: °· Acupuncture. °· Relaxation exercises (yoga, meditation). °· Group therapy. °· Counseling. °SEEK MEDICAL CARE IF:  °· You notice certain foods cause stomach pain. °· Your home care treatment is not helping your pain. °· You need stronger pain medicine. °· You want your IUD removed. °· You feel faint or   lightheaded. °· You develop nausea and vomiting. °· You develop a rash. °· You are having side effects or an allergy to your medicine. °SEEK IMMEDIATE MEDICAL CARE IF:  °· Your  pain does not go away or gets worse. °· You have a fever. °· Your pain is felt only in portions of the abdomen. The right side could possibly be appendicitis. The left lower portion of the abdomen could be colitis or diverticulitis. °· You are passing blood in your stools (bright red or black tarry stools, with or without vomiting). °· You have blood in your urine. °· You develop chills, with or without a fever. °· You pass out. °MAKE SURE YOU:  °· Understand these instructions. °· Will watch your condition. °· Will get help right away if you are not doing well or get worse. °Document Released: 12/27/2006 Document Revised: 05/24/2011 Document Reviewed: 01/16/2009 °ExitCare® Patient Information ©2014 [employer]. ° °

## 2013-05-20 NOTE — ED Provider Notes (Signed)
CSN: [id number]     Arrival date & time 05/20/13  1943 History  This chart was scribed for Leota Jacobsen, MD by Zettie Pho, ED Scribe. This patient was seen in room APA08/APA08 and the patient's care was started at 8:25 PM.    Chief Complaint  Patient presents with  . Abdominal Pain  . Emesis   The history is provided by the patient. No language interpreter was used.   HPI Comments: Patricia Rivers is a 28 y.o. female who presents to the Emergency Department complaining of a constant pain to the bilateral upper quadrants of the abdomen with associated, multiple episodes of yellow-colored emesis onset 5 days ago after she reports that she had rectal polyps surgically removed. She reports some associated, intermittent dysuria. Patient reports taking Prilosec and Bentyl at home without significant relief. She denies blood in the stool, fever. Patient has allergies to codeine, hydromorphone HCl, penicillins, latex, and clear tape. Patient has no other pertinent medical history.   Past Medical History  Diagnosis Date  . Right ankle injury   . Colon polyp   . Rectal polyp    Past Surgical History  Procedure Laterality Date  . None    . No past surgeries     Family History  Problem Relation Age of Onset  . Colon cancer Maternal Grandmother   . Cervical cancer Mother   . Asthma    . Diabetes     History  Substance Use Topics  . Smoking status: Current Every Day Smoker -- 1.50 packs/day for 11 years    Types: Cigarettes  . Smokeless tobacco: Not on file  . Alcohol Use: Yes     Comment: occasionally   OB History   Grav Para Term Preterm Abortions TAB SAB Ect Mult Living                 Review of Systems  Constitutional: Negative for fever.  Gastrointestinal: Positive for vomiting and abdominal pain. Negative for blood in stool.  Genitourinary: Positive for dysuria.      Allergies  Codeine; Hydromorphone hcl; Penicillins; Latex; and Tape  Home Medications   Current  Outpatient Rx  Name  Route  Sig  Dispense  Refill  . diclofenac (VOLTAREN) 75 MG EC tablet   Oral   Take 1 tablet (75 mg total) by mouth 2 (two) times daily.   14 tablet   0   . dicyclomine (BENTYL) 10 MG capsule   Oral   Take 1 capsule (10 mg total) by mouth 4 (four) times daily -  before meals and at bedtime.   120 capsule   3   . HYDROcodone-acetaminophen (NORCO) 5-325 MG per tablet   Oral   Take 1 tablet by mouth every 6 (six) hours as needed for moderate pain.   90 tablet   0   . promethazine (PHENERGAN) 25 MG tablet   Oral   Take 1 tablet (25 mg total) by mouth every 6 (six) hours as needed for nausea or vomiting.   20 tablet   0    Triage Vitals: BP 127/80  Pulse 98  Temp(Src) 98.3 F (36.8 C) (Oral)  Resp 24  Ht 5\' 8"  (1.727 m)  Wt 220 lb (99.791 kg)  BMI 33.46 kg/m2  SpO2 98%  Physical Exam  Nursing note and vitals reviewed. Constitutional: She is oriented to person, place, and time. She appears well-developed and well-nourished.  Non-toxic appearance. No distress.  HENT:  Head: Normocephalic and  atraumatic.  Eyes: Conjunctivae, EOM and lids are normal. Pupils are equal, round, and reactive to light.  Neck: Normal range of motion. Neck supple. No tracheal deviation present. No mass present.  Cardiovascular: Normal rate, regular rhythm and normal heart sounds.  Exam reveals no gallop.   No murmur heard. Pulmonary/Chest: Effort normal and breath sounds normal. No stridor. No respiratory distress. She has no decreased breath sounds. She has no wheezes. She has no rhonchi. She has no rales.  Abdominal: Soft. Normal appearance and bowel sounds are normal. She exhibits no distension. There is tenderness. There is no rebound and no CVA tenderness.  Diffuse tenderness to palpation. No peritoneal signs.   Musculoskeletal: Normal range of motion. She exhibits no edema and no tenderness.  Neurological: She is alert and oriented to person, place, and time. She has  normal strength. No cranial nerve deficit or sensory deficit. GCS eye subscore is 4. GCS verbal subscore is 5. GCS motor subscore is 6.  Skin: Skin is warm and dry. No abrasion and no rash noted.  Psychiatric: She has a normal mood and affect. Her speech is normal and behavior is normal.    ED Course  Procedures (including critical care time)  DIAGNOSTIC STUDIES: Oxygen Saturation is 98% on room air, normal by my interpretation.    COORDINATION OF CARE: 8:28 PM- Will order blood labs (CBC, CMP, lipase), UA, a chest x-ray, and a CT of the abdomen. Will order IV fluids, Zofran, morphine, and Dilaudid to manage symptoms. Discussed treatment plan with patient at bedside and patient verbalized agreement.     Labs Review Labs Reviewed  URINALYSIS, ROUTINE W REFLEX MICROSCOPIC - Abnormal; Notable for the following:    Specific Gravity, Urine >1.030 (*)    All other components within normal limits  CBC WITH DIFFERENTIAL  COMPREHENSIVE METABOLIC PANEL  LIPASE, BLOOD   Imaging Review Ct Abdomen Pelvis W Contrast  05/20/2013   CLINICAL DATA:  Abdominal pain, nausea, vomiting, diarrhea.  EXAM: CT ABDOMEN AND PELVIS WITH CONTRAST  TECHNIQUE: Multidetector CT imaging of the abdomen and pelvis was performed using the standard protocol following bolus administration of intravenous contrast.  CONTRAST:  33mL OMNIPAQUE IOHEXOL 300 MG/ML SOLN, 78mL OMNIPAQUE IOHEXOL 300 MG/ML SOLN  COMPARISON:  05/20/2013 radiograph, 04/06/2013 CT  FINDINGS: Mild dependent atelectasis. Lung bases otherwise clear. Normal heart size.  No appreciable abnormality of the liver, spleen, pancreas, adrenal glands. Contracted gallbladder. No biliary ductal dilatation. Symmetric renal enhancement. No hydroureteronephrosis.  Decompressed colon. No overt colitis. Normal appendix. Small bowel loops are of normal course and caliber. Small fat containing umbilical hernia. No free intraperitoneal air or fluid. No lymphadenopathy.  Normal  caliber aorta and branch vessels.  Thin walled bladder. Unremarkable CT appearance to the uterus and adnexa.  No acute osseous finding.  IMPRESSION: No acute abdominopelvic process identified by CT.   Electronically Signed   By: Carlos Levering M.D.   On: 05/20/2013 22:41   Dg Abd Acute W/chest  05/20/2013   CLINICAL DATA:  Continued abdominal pain. Colonoscopy and upper GI endoscopy 3 days ago.  EXAM: ACUTE ABDOMEN SERIES (ABDOMEN 2 VIEW & CHEST 1 VIEW)  COMPARISON:  04/06/2013 CT  FINDINGS: The lungs are predominantly clear. Cardiomediastinal contours within normal range.  No free intraperitoneal air. Relative paucity of bowel gas in a nonobstructive pattern. No radiopaque calculi. No acute osseous finding.  IMPRESSION: Negative abdominal radiographs.  No acute cardiopulmonary disease.   Electronically Signed   By: Delano Metz.D.  On: 05/20/2013 21:26     EKG Interpretation None      MDM   Final diagnoses:  None   Patient given pain meds here and IV fluids and feels better., CT without acute findings. Repeat abdominal exam at time of discharge remained stable   I personally performed the services described in this documentation, which was scribed in my presence. The recorded information has been reviewed and is accurate.     Leota Jacobsen, MD 05/20/13 [phone]

## 2013-05-20 NOTE — ED Notes (Signed)
Patient complaining of abdominal pain radiating across upper abdomen. Also reports emesis. Recently seen by Dr. Gala Romney and had polyps removed.

## 2013-05-21 ENCOUNTER — Encounter: Payer: Self-pay | Admitting: Internal Medicine

## 2013-05-21 ENCOUNTER — Encounter (HOSPITAL_COMMUNITY): Payer: Self-pay | Admitting: Internal Medicine

## 2013-05-21 ENCOUNTER — Telehealth: Payer: Self-pay

## 2013-05-21 NOTE — Telephone Encounter (Signed)
Message copied by Rosanne Sack on Mon May 21, 2013 10:20 AM ------      Message from: Daneil Dolin      Created: Mon May 21, 2013  7:50 AM       Patient reported bleeding after her colonoscopy Friday went to ED. Please call and check on her today ------

## 2013-05-21 NOTE — Progress Notes (Signed)
Patient ID: [name], female   DOB: [birth year], 28 y.o.   MRN: [id number]   Dr. Saralyn Pilar called me about the path pathology report on this lady. 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 Felt to have been completely removed with cold biopsied-no specific followup necessary. 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 No invasive cancer seen. After some discussion, it was felt to that it would be in the patient's best interest to return in 3 months to have this site looked at again - site was nicely tattooed.  Biopsy of stomach-mild gastritis. No H. pylori  The patient will need propofol at the time of that followup colonoscopy - 3 months.

## 2013-05-21 NOTE — Telephone Encounter (Signed)
Pt stated that she could not keep anything down. I advised her to take small sips of clears. If she is unable to keep that down she may need to go back to the ER.

## 2013-05-21 NOTE — Telephone Encounter (Signed)
Noted  

## 2013-05-21 NOTE — Progress Notes (Signed)
Patient has appt with me on 3/10 at 11:30.

## 2013-05-21 NOTE — Telephone Encounter (Signed)
Pt is coming Tuesday at 11:30 to see AS.

## 2013-05-21 NOTE — Telephone Encounter (Signed)
ED evaluation reassuring. 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 Needs office visit with extender today. May need a course of Levsin and brief course of oral Cipro and Flagyl. Clear liquid diet for a day or 2.

## 2013-05-21 NOTE — Telephone Encounter (Signed)
Patient spoke with Ginger.

## 2013-05-21 NOTE — Telephone Encounter (Signed)
I called to check on patient this morning. She had to go back to the ER last night because she was hurting so bad. The ER doctor could not find anything wrong. She had pain on her R side of the stomach.No more bleeding or rectal pain. She has had a normal BM since her TCS.

## 2013-05-22 ENCOUNTER — Ambulatory Visit (INDEPENDENT_AMBULATORY_CARE_PROVIDER_SITE_OTHER): Payer: Self-pay | Admitting: Gastroenterology

## 2013-05-22 ENCOUNTER — Telehealth: Payer: Self-pay

## 2013-05-22 ENCOUNTER — Encounter: Payer: Self-pay | Admitting: Gastroenterology

## 2013-05-22 VITALS — BP 128/91 | HR 84 | Temp 96.8°F | Wt 219.2 lb

## 2013-05-22 DIAGNOSIS — D3A026 Benign carcinoid tumor of the rectum: Secondary | ICD-10-CM

## 2013-05-22 DIAGNOSIS — R195 Other fecal abnormalities: Secondary | ICD-10-CM

## 2013-05-22 DIAGNOSIS — R1011 Right upper quadrant pain: Secondary | ICD-10-CM

## 2013-05-22 DIAGNOSIS — D126 Benign neoplasm of colon, unspecified: Secondary | ICD-10-CM

## 2013-05-22 MED ORDER — HYOSCYAMINE SULFATE 0.125 MG SL SUBL: 0.1250 mg | SUBLINGUAL_TABLET | SUBLINGUAL | Status: DC | PRN

## 2013-05-22 MED ORDER — PANTOPRAZOLE SODIUM 40 MG PO TBEC: 40.0000 mg | DELAYED_RELEASE_TABLET | Freq: Every day | ORAL | Status: DC

## 2013-05-22 MED ORDER — METRONIDAZOLE 500 MG PO TABS: 500.0000 mg | ORAL_TABLET | Freq: Three times a day (TID) | ORAL | Status: DC

## 2013-05-22 MED ORDER — CIPROFLOXACIN HCL 500 MG PO TABS: 500.0000 mg | ORAL_TABLET | Freq: Two times a day (BID) | ORAL | Status: DC

## 2013-05-22 MED ORDER — FLUCONAZOLE 150 MG PO TABS: 150.0000 mg | ORAL_TABLET | Freq: Once | ORAL | Status: DC

## 2013-05-22 NOTE — Progress Notes (Signed)
Referring Provider: No ref. provider found Primary Care Physician:  No PCP Per Patient Primary GI: Dr. Gala Romney   Chief Complaint  Patient presents with  . Abdominal Pain  . Nausea  . Emesis    HPI:   Patricia Rivers presents today as urgent work-in after TCS/EGD performed recently. Called in with abdominal pain following colonoscopy. Question of post-polypectomy syndrome. 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 EGD with mild gastritis, negative H.pylori. She will need repeat TCS in June 2015 to reevaluate site.   Notes persistent RUQ discomfort, painful. Constant. Worsened after colonoscopy. Noted some mild bleeding after colonoscopy. Resolved now. +N/V, presenting to the ED. Diarrhea. Yellowish. 3-4 loose stools per day. Feels like it has worsened. Pain worse with eating. No improvement after BM. Fever last night (100.1). Bentyl without improvement. No improvement with Prilosec.   [place] of abdomen on file from earlier this year with no gallstones. HIDA with normal EF at 77.6%. She did report reproduction of pain with CCK infusion.   Past Medical History  Diagnosis Date  . Right ankle injury   . Colon polyp   . Rectal polyp   . Carcinoid tumor of rectum   . 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 Colonoscopy with esophagogastroduodenoscopy (egd) N/A 05/16/2013    Dr. Gala Romney: rectal polyp with low grade carcinoid tumor, colon polyp with tubulovillous adenoma with high grade dysplasia. Needs repeat colonoscopy in 3 months. EGD with antral erosions, duodenal bulb erosions, superficial ulceration. path with mild chronic gastritis     Current Outpatient Prescriptions  Medication Sig Dispense Refill  . dicyclomine (BENTYL) 10 MG capsule Take 1 capsule (10 mg total) by mouth 4 (four) times daily -  before meals and at bedtime.   120 capsule  3  . HYDROcodone-acetaminophen (NORCO) 5-325 MG per tablet Take 1 tablet by mouth every 6 (six) hours as needed for moderate pain.  90 tablet  0  . ciprofloxacin (CIPRO) 500 MG tablet Take 1 tablet (500 mg total) by mouth 2 (two) times daily.  10 tablet  0  . fluconazole (DIFLUCAN) 150 MG tablet Take 1 tablet (150 mg total) by mouth once.  1 tablet  0  . hyoscyamine (LEVSIN SL) 0.125 MG SL tablet Place 1 tablet (0.125 mg total) under the tongue every 4 (four) hours as needed.  90 tablet  3  . metroNIDAZOLE (FLAGYL) 500 MG tablet Take 1 tablet (500 mg total) by mouth 3 (three) times daily.  15 tablet  0  . oxyCODONE-acetaminophen (PERCOCET/ROXICET) 5-325 MG per tablet Take 2 tablets by mouth every 4 (four) hours as needed for severe pain.  10 tablet  0  . pantoprazole (PROTONIX) 40 MG tablet Take 1 tablet (40 mg total) by mouth daily. Take 30 minutes before breakfast  30 tablet  5  . promethazine (PHENERGAN) 25 MG tablet Take 1 tablet (25 mg total) by mouth every 6 (six) hours as needed for nausea or vomiting.  20 tablet  0   No current facility-administered medications for this visit.    Allergies as of 05/22/2013 - Review Complete 05/22/2013  Allergen Reaction Noted  . Codeine Nausea And Vomiting 12/15/2010  . Hydromorphone hcl Nausea And Vomiting 12/15/2010  . Nsaids  05/20/2013  . Penicillins Hives 12/15/2010  . Latex Rash 05/10/2013  . Tape Itching and  Rash 05/10/2013    Family History  Problem Relation Age of Onset  . Colon cancer Maternal Grandmother   . Cervical cancer Mother   . Asthma    . Diabetes      History   Social History  . Marital Status: Married    Spouse Name: N/A    Number of Children: N/A  . Years of Education: N/A   Social History Main Topics  . Smoking status: Current Every Day Smoker -- 1.50 packs/day for 11 years    Types: Cigarettes  . Smokeless tobacco: None  . Alcohol Use: Yes     Comment: occasionally  . Drug Use: No  . Sexual  Activity: None   Other Topics Concern  . None   Social History Narrative  . None    Review of Systems: As mentioned in HPI.   Physical Exam: BP 128/91  Pulse 84  Temp(Src) 96.8 F (36 C) (Oral)  Wt 219 lb 3.2 oz (99.428 kg) General:   Alert and oriented. No distress noted. Pleasant and cooperative.  Head:  Normocephalic and atraumatic. Eyes:  Conjuctiva clear without scleral icterus. Heart:  S1, S2 present without murmurs, rubs, or gallops. Regular rate and rhythm. Abdomen:  +BS, soft, TTP right sided abdomen/RUQ and non-distended. No rebound or guarding. No HSM or masses noted. Msk:  Symmetrical without gross deformities. Normal posture. Extremities:  Without edema. Neurologic:  Alert and  oriented x4;  grossly normal neurologically. Skin:  Intact without significant lesions or rashes. Psych:  Alert and cooperative. Normal mood and affect.  Lab Results  Component Value Date   WBC 9.2 05/20/2013   HGB 14.4 05/20/2013   HCT 40.8 05/20/2013   MCV 89.1 05/20/2013   PLT 197 05/20/2013   Lab Results  Component Value Date   CREATININE 0.85 05/20/2013   BUN 14 05/20/2013   NA 140 05/20/2013   K 4.1 05/20/2013   CL 104 05/20/2013   CO2 25 05/20/2013   Lab Results  Component Value Date   LIPASE 31 05/20/2013   Lab Results  Component Value Date   ALT 20 05/20/2013   AST 15 05/20/2013   ALKPHOS 85 05/20/2013   BILITOT 0.3 05/20/2013   CT March 2015: normal

## 2013-05-22 NOTE — Progress Notes (Signed)
Reminder in epic to follow up in 3 months to set up tcs

## 2013-05-22 NOTE — Patient Instructions (Signed)
Stop Prilosec. Start taking Protonix one capsule each morning, 30 minutes before breakfast.   I have sent in two antibiotics to your pharmacy to take for a total of 5 days.   Stop Bentyl. Start taking Levsin every 4 hours as needed for abdominal cramping.  Please call if you do not have improvement of your symptoms in the next 24-48 hours.   We have referred you to Dr. Arnoldo Morale for consideration of gallbladder removal in the future if indicated.

## 2013-05-22 NOTE — Telephone Encounter (Signed)
I sent Diflucan in to pharmacy to use only if needed.

## 2013-05-22 NOTE — Telephone Encounter (Signed)
She would like for you to call her in something for a yeast infection also since she will be taking two anti-bx.

## 2013-05-24 ENCOUNTER — Encounter: Payer: Self-pay | Admitting: Gastroenterology

## 2013-05-25 DIAGNOSIS — D126 Benign neoplasm of colon, unspecified: Secondary | ICD-10-CM | POA: Insufficient documentation

## 2013-05-25 DIAGNOSIS — D3A026 Benign carcinoid tumor of the rectum: Secondary | ICD-10-CM | POA: Insufficient documentation

## 2013-05-25 NOTE — Assessment & Plan Note (Signed)
With negative stool studies and colonoscopy on file. Stop Bentyl. Start Levsin prn cramping. Stop Prilosec. Start Protonix. Call if no improvement in symptoms.

## 2013-05-25 NOTE — Assessment & Plan Note (Signed)
With high grade dysplasia. Needs early interval surveillance in June 2015.

## 2013-05-25 NOTE — Assessment & Plan Note (Signed)
Acute on chronic, worsening after colonoscopy. Not completely convinced this is polypectomy syndrome; still question biliary dyskinesia. EGD on file. Will empirically provide short course of Cipro and Flagyl for any component of postpolypectomy syndrome. Refer to Dr. Arnoldo Morale for consideration of lap chole.

## 2013-05-25 NOTE — Assessment & Plan Note (Signed)
Felt to be completely resected with no specific follow-up recommended.

## 2013-05-28 NOTE — Progress Notes (Signed)
No pcp

## 2013-06-04 ENCOUNTER — Telehealth: Payer: Self-pay | Admitting: *Deleted

## 2013-06-04 NOTE — Telephone Encounter (Signed)
Need to check Cdiff, as she has recently been on abx.  May use kaopectate or imodium in interim.

## 2013-06-04 NOTE — Telephone Encounter (Signed)
Pt called stating starting this morning she has had really bad diarrhea that is green and it is really burning when she goes to the bathroom, pt states she feels like her butt is on fire, pt wants to know what can she do because it has been on going since 9 this morning. Please advise [phone]

## 2013-06-05 ENCOUNTER — Emergency Department (HOSPITAL_COMMUNITY): Payer: Self-pay

## 2013-06-05 ENCOUNTER — Telehealth: Payer: Self-pay | Admitting: *Deleted

## 2013-06-05 ENCOUNTER — Other Ambulatory Visit: Payer: Self-pay

## 2013-06-05 ENCOUNTER — Emergency Department (HOSPITAL_COMMUNITY)
Admission: EM | Admit: 2013-06-05 | Discharge: 2013-06-05 | Disposition: A | Discharge status: Home / Self Care | Payer: Self-pay | Attending: Emergency Medicine | Admitting: Emergency Medicine

## 2013-06-05 ENCOUNTER — Encounter (HOSPITAL_COMMUNITY): Payer: Self-pay | Admitting: Emergency Medicine

## 2013-06-05 DIAGNOSIS — R109 Unspecified abdominal pain: Secondary | ICD-10-CM

## 2013-06-05 DIAGNOSIS — R195 Other fecal abnormalities: Secondary | ICD-10-CM

## 2013-06-05 DIAGNOSIS — Z91040 Latex allergy status: Secondary | ICD-10-CM | POA: Insufficient documentation

## 2013-06-05 DIAGNOSIS — Z860100 Personal history of colon polyps, unspecified: Secondary | ICD-10-CM | POA: Insufficient documentation

## 2013-06-05 DIAGNOSIS — Z3202 Encounter for pregnancy test, result negative: Secondary | ICD-10-CM | POA: Insufficient documentation

## 2013-06-05 DIAGNOSIS — Z85048 Personal history of other malignant neoplasm of rectum, rectosigmoid junction, and anus: Secondary | ICD-10-CM | POA: Insufficient documentation

## 2013-06-05 DIAGNOSIS — R1084 Generalized abdominal pain: Secondary | ICD-10-CM | POA: Insufficient documentation

## 2013-06-05 DIAGNOSIS — E669 Obesity, unspecified: Secondary | ICD-10-CM | POA: Insufficient documentation

## 2013-06-05 DIAGNOSIS — R197 Diarrhea, unspecified: Secondary | ICD-10-CM

## 2013-06-05 DIAGNOSIS — Z9889 Other specified postprocedural states: Secondary | ICD-10-CM | POA: Insufficient documentation

## 2013-06-05 DIAGNOSIS — Z880 Allergy status to penicillin: Secondary | ICD-10-CM | POA: Insufficient documentation

## 2013-06-05 DIAGNOSIS — R112 Nausea with vomiting, unspecified: Secondary | ICD-10-CM

## 2013-06-05 DIAGNOSIS — Z792 Long term (current) use of antibiotics: Secondary | ICD-10-CM | POA: Insufficient documentation

## 2013-06-05 DIAGNOSIS — K6289 Other specified diseases of anus and rectum: Secondary | ICD-10-CM | POA: Insufficient documentation

## 2013-06-05 DIAGNOSIS — Z87828 Personal history of other (healed) physical injury and trauma: Secondary | ICD-10-CM | POA: Insufficient documentation

## 2013-06-05 DIAGNOSIS — F17200 Nicotine dependence, unspecified, uncomplicated: Secondary | ICD-10-CM | POA: Insufficient documentation

## 2013-06-05 DIAGNOSIS — Z79899 Other long term (current) drug therapy: Secondary | ICD-10-CM | POA: Insufficient documentation

## 2013-06-05 LAB — URINALYSIS, ROUTINE W REFLEX MICROSCOPIC
Bilirubin Urine: NEGATIVE
Glucose, UA: NEGATIVE mg/dL
Hgb urine dipstick: NEGATIVE
Ketones, ur: NEGATIVE mg/dL
Leukocytes, UA: NEGATIVE
Nitrite: NEGATIVE
Protein, ur: NEGATIVE mg/dL
Specific Gravity, Urine: >1.03 — ABNORMAL HIGH (ref 1.005–1.030)
Urobilinogen, UA: 0.2 mg/dL (ref 0.0–1.0)
pH: 6 (ref 5.0–8.0)

## 2013-06-05 LAB — CBC WITH DIFFERENTIAL/PLATELET
Basophils Absolute: 0 10*3/uL (ref 0.0–0.1)
Basophils Relative: 0 % (ref 0–1)
Eosinophils Absolute: 0.4 10*3/uL (ref 0.0–0.7)
Eosinophils Relative: 4 % (ref 0–5)
HCT: 44.4 % (ref 36.0–46.0)
Hemoglobin: 15.3 g/dL — ABNORMAL HIGH (ref 12.0–15.0)
Lymphocytes Relative: 27 % (ref 12–46)
Lymphs Abs: 2.7 10*3/uL (ref 0.7–4.0)
MCH: 30.8 pg (ref 26.0–34.0)
MCHC: 34.5 g/dL (ref 30.0–36.0)
MCV: 89.3 fL (ref 78.0–100.0)
Monocytes Absolute: 0.5 10*3/uL (ref 0.1–1.0)
Monocytes Relative: 5 % (ref 3–12)
Neutro Abs: 6.3 10*3/uL (ref 1.7–7.7)
Neutrophils Relative %: 64 % (ref 43–77)
Platelets: 189 10*3/uL (ref 150–400)
RBC: 4.97 MIL/uL (ref 3.87–5.11)
RDW: 12.3 % (ref 11.5–15.5)
WBC: 10 10*3/uL (ref 4.0–10.5)

## 2013-06-05 LAB — BASIC METABOLIC PANEL
BUN: 16 mg/dL (ref 6–23)
CO2: 24 mEq/L (ref 19–32)
Calcium: 9.7 mg/dL (ref 8.4–10.5)
Chloride: 104 mEq/L (ref 96–112)
Creatinine, Ser: 0.89 mg/dL (ref 0.50–1.10)
GFR calc Af Amer: >90 mL/min (ref >90)
GFR calc non Af Amer: 87 mL/min — ABNORMAL LOW (ref >90)
Glucose, Bld: 101 mg/dL — ABNORMAL HIGH (ref 70–99)
Potassium: 3.9 mEq/L (ref 3.7–5.3)
Sodium: 140 mEq/L (ref 137–147)

## 2013-06-05 LAB — PREGNANCY, URINE: Preg Test, Ur: NEGATIVE

## 2013-06-05 MED ORDER — ONDANSETRON HCL 4 MG PO TABS: 4.0000 mg | ORAL_TABLET | Freq: Four times a day (QID) | ORAL | Status: DC

## 2013-06-05 MED ORDER — MORPHINE SULFATE 4 MG/ML IJ SOLN
6.0000 mg | Freq: Once | INTRAMUSCULAR | Status: AC
  Administered 2013-06-05: 6 mg via INTRAVENOUS
  Filled 2013-06-05: qty 2

## 2013-06-05 MED ORDER — ONDANSETRON HCL 4 MG/2ML IJ SOLN
4.0000 mg | Freq: Once | INTRAMUSCULAR | Status: AC
  Administered 2013-06-05: 4 mg via INTRAMUSCULAR
  Filled 2013-06-05: qty 2

## 2013-06-05 MED ORDER — SODIUM CHLORIDE 0.9 % IV BOLUS (SEPSIS)
1000.0000 mL | Freq: Once | INTRAVENOUS | Status: AC
  Administered 2013-06-05: 1000 mL via INTRAVENOUS

## 2013-06-05 MED ORDER — OXYCODONE-ACETAMINOPHEN 5-325 MG PO TABS: 1.0000 | ORAL_TABLET | ORAL | Status: DC | PRN

## 2013-06-05 NOTE — Telephone Encounter (Signed)
Stool kit up front for her to pick up with the orders.

## 2013-06-05 NOTE — Discharge Instructions (Signed)
Abdominal Pain, Adult °Many things can cause abdominal pain. Usually, abdominal pain is not caused by a disease and will improve without treatment. It can often be observed and treated at home. Your health care provider will do a physical exam and possibly order blood tests and X-rays to help determine the seriousness of your pain. However, in many cases, more time must pass before a clear cause of the pain can be found. Before that point, your health care provider may not know if you need more testing or further treatment. °HOME CARE INSTRUCTIONS  °Monitor your abdominal pain for any changes. The following actions may help to alleviate any discomfort you are experiencing: °· Only take over-the-counter or prescription medicines as directed by your health care provider. °· Do not take laxatives unless directed to do so by your health care provider. °· Try a clear liquid diet (broth, tea, or water) as directed by your health care provider. Slowly move to a bland diet as tolerated. °SEEK MEDICAL CARE IF: °· You have unexplained abdominal pain. °· You have abdominal pain associated with nausea or diarrhea. °· You have pain when you urinate or have a bowel movement. °· You experience abdominal pain that wakes you in the night. °· You have abdominal pain that is worsened or improved by eating food. °· You have abdominal pain that is worsened with eating fatty foods. °SEEK IMMEDIATE MEDICAL CARE IF:  °· Your pain does not go away within 2 hours. °· You have a fever. °· You keep throwing up (vomiting). °· Your pain is felt only in portions of the abdomen, such as the right side or the left lower portion of the abdomen. °· You pass bloody or black tarry stools. °MAKE SURE YOU: °· Understand these instructions.   °· Will watch your condition.   °· Will get help right away if you are not doing well or get worse.   °Document Released: 12/09/2004 Document Revised: 12/20/2012 Document Reviewed: 11/08/2012 °[employer]® Patient  Information ©2014 [employer]. ° °

## 2013-06-05 NOTE — Telephone Encounter (Signed)
Pt called wanting to know if anyone has decided on what she needs to do about her gall bladder, pt said she was told to go see Dr. Arnoldo Morale and they are going to charge her 600$ for a office visit. Please advise [phone]

## 2013-06-05 NOTE — ED Notes (Signed)
MD at bedside. 

## 2013-06-05 NOTE — ED Notes (Signed)
Generalized abd pain with diarrhea x 2 days.  Vomiting starting today.  GI awaiting c-diff testing to come back.

## 2013-06-05 NOTE — Telephone Encounter (Signed)
Patient had [facility] paged stating she is throwing up poop.  Per Almyra Free, LPN the patient needs to go to the ER.  She stated she was on her way to [facility].

## 2013-06-07 ENCOUNTER — Telehealth: Payer: Self-pay

## 2013-06-07 LAB — CLOSTRIDIUM DIFFICILE BY PCR: Toxigenic C. Difficile by PCR: NOT DETECTED

## 2013-06-07 NOTE — Telephone Encounter (Signed)
Pt calling for results of Cdiff. She is still having the diarrhea. Please advise

## 2013-06-08 NOTE — Progress Notes (Signed)
Quick Note:  Cdiff negative.  How is patient doing?   ______

## 2013-06-08 NOTE — Telephone Encounter (Signed)
I have made a result note. Negative Cdiff.

## 2013-06-09 NOTE — ED Provider Notes (Signed)
CSN: [id number]     Arrival date & time 06/05/13  1731 History   First MD Initiated Contact with Patient 06/05/13 1856     Chief Complaint  Patient presents with  . Abdominal Pain     (Consider location/radiation/quality/duration/timing/severity/associated sxs/prior Treatment) HPI  28 year old female with abdominal pain and diarrhea. Patient reports persistent diarrhea since a recent colonoscopy. Vomiting in the past 24 hours. Nonbloody emesis and stool. Diffuse crampy abdominal pain. No fevers or chills. Rectal pain. Reports that Cdiff studies are currently pending. No urinary complaints.  Past Medical History  Diagnosis Date  . Right ankle injury   . Colon polyp   . Rectal polyp   . Carcinoid tumor of rectum   . 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 Colonoscopy with esophagogastroduodenoscopy (egd) N/A 05/16/2013    Dr. Gala Romney: rectal polyp with low grade carcinoid tumor, colon polyp with tubulovillous adenoma with high grade dysplasia. Needs repeat colonoscopy in 3 months. EGD with antral erosions, duodenal bulb erosions, superficial ulceration. path with mild chronic gastritis    Family History  Problem Relation Age of Onset  . Colon cancer Maternal Grandmother   . Cervical cancer Mother   . Asthma    . Diabetes     History  Substance Use Topics  . Smoking status: Current Every Day Smoker -- 1.50 packs/day for 11 years    Types: Cigarettes  . Smokeless tobacco: Not on file  . Alcohol Use: Yes     Comment: occasionally   OB History   Grav Para Term Preterm Abortions TAB SAB Ect Mult Living                 Review of Systems  All systems reviewed and negative, other than as noted in HPI.   Allergies  Codeine; Hydromorphone hcl; Nsaids; Penicillins; Latex; and Tape  Home Medications   Current Outpatient Rx  Name  Route  Sig  Dispense  Refill  . HYDROcodone-acetaminophen (NORCO) 5-325  MG per tablet   Oral   Take 1 tablet by mouth every 6 (six) hours as needed for moderate pain.   90 tablet   0   . pantoprazole (PROTONIX) 40 MG tablet   Oral   Take 1 tablet (40 mg total) by mouth daily. Take 30 minutes before breakfast   30 tablet   5   . promethazine (PHENERGAN) 25 MG tablet   Oral   Take 25 mg by mouth every 6 (six) hours as needed for nausea or vomiting.         . ciprofloxacin (CIPRO) 500 MG tablet   Oral   Take 1 tablet (500 mg total) by mouth 2 (two) times daily.   10 tablet   0   . hyoscyamine (LEVSIN SL) 0.125 MG SL tablet   Sublingual   Place 1 tablet (0.125 mg total) under the tongue every 4 (four) hours as needed.   90 tablet   3   . metroNIDAZOLE (FLAGYL) 500 MG tablet   Oral   Take 1 tablet (500 mg total) by mouth 3 (three) times daily.   15 tablet   0   . ondansetron (ZOFRAN) 4 MG tablet   Oral   Take 1 tablet (4 mg total) by mouth every 6 (six) hours.   12 tablet   0   . oxyCODONE-acetaminophen (PERCOCET/ROXICET) 5-325 MG per tablet   Oral  Take 1 tablet by mouth every 4 (four) hours as needed for severe pain.   15 tablet   0    BP 113/71  Pulse 75  Temp(Src) 97.8 F (36.6 C) (Oral)  Resp 18  Ht 5\' 8"  (1.727 m)  Wt 218 lb (98.884 kg)  BMI 33.15 kg/m2  SpO2 98% Physical Exam  Nursing note and vitals reviewed. Constitutional: She appears well-developed.  Laying in bed. No acute distress. Obese.  HENT:  Head: Normocephalic and atraumatic.  Eyes: Conjunctivae are normal. Right eye exhibits no discharge. Left eye exhibits no discharge.  Neck: Neck supple.  Cardiovascular: Normal rate, regular rhythm and normal heart sounds.  Exam reveals no gallop and no friction rub.   No murmur heard. Pulmonary/Chest: Effort normal and breath sounds normal. No respiratory distress.  Abdominal: Soft. She exhibits no distension. There is tenderness.  Mild diffuse abdominal tenderness without rebound or guarding. No distention.   Genitourinary:  No CVA tenderness.  Musculoskeletal: She exhibits no edema and no tenderness.  Neurological: She is alert.  Skin: Skin is warm and dry.  Psychiatric: She has a normal mood and affect. Her behavior is normal. Thought content normal.    ED Course  Procedures (including critical care time) Labs Review Labs Reviewed  CBC WITH DIFFERENTIAL - Abnormal; Notable for the following:    Hemoglobin 15.3 (*)    All other components within normal limits  BASIC METABOLIC PANEL - Abnormal; Notable for the following:    Glucose, Bld 101 (*)    GFR calc non Af Amer 87 (*)    All other components within normal limits  URINALYSIS, ROUTINE W REFLEX MICROSCOPIC - Abnormal; Notable for the following:    Specific Gravity, Urine >1.030 (*)    All other components within normal limits  PREGNANCY, URINE   Imaging Review No results found.   EKG Interpretation None      MDM   Final diagnoses:  Abdominal pain  Diarrhea  Nausea and vomiting    28 year old female with persistent diarrhea and no vomiting. No peritonitis. C. difficile studies are still pending. Imaging negative for signs of obstruction. Labs are fairly unremarkable. Symptoms improved. Plan continue symptomatic treatment at this time. Outpatient followup with surgery to possibly pursue cholecystectomy. Outpatient followup to further monitor/workup or diarrhea.    Virgel Manifold, MD 06/10/13 [phone]

## 2013-06-11 ENCOUNTER — Telehealth: Payer: Self-pay | Admitting: Internal Medicine

## 2013-06-11 NOTE — Telephone Encounter (Signed)
Patient called to see if [facility] had done a referral to set her up to have her GB taken out. She is still having diarrhea and burning. She said medicines are not helping her any. Also, said that her Cdiff results came back negative, but was asking about having her gallbladder removed. Please advise [phone]

## 2013-06-11 NOTE — Telephone Encounter (Signed)
Patient called back. She gave [place] the wrong number. Please call her at [phone]

## 2013-06-12 NOTE — Telephone Encounter (Signed)
I spoke with the patient and she stated she would try and get the requested paperwork in ASAP, to complete her application process.

## 2013-06-12 NOTE — Telephone Encounter (Signed)
Patricia Rivers is working on assistance paperwork for pt. Routing to CM

## 2013-06-12 NOTE — Telephone Encounter (Signed)
Email from Financial Counselor:   Patricia Rivers, I reviewed the application that you submitted and we need additional information, if they moved here from [place] can they provide bank records and pay stubs from prior job.  I will also consider 2014 tax return if unable to provide income.  Thanks   [place]  Patient Research scientist (physical sciences), Customer Service   Direct Dial: [email or web address]

## 2013-06-13 NOTE — Progress Notes (Signed)
The number is out of order

## 2013-06-13 NOTE — Telephone Encounter (Signed)
Tried to call to check on her but the phone was not working

## 2013-07-02 ENCOUNTER — Telehealth: Payer: Self-pay

## 2013-07-02 NOTE — Telephone Encounter (Signed)
Pt is out of town visiting and she is hurting in her stomach (where her gallbladder it). She is having diarrhea and her bottom is raw. She is having to use wet rags because her bottom is os raw.Her stools are green. I told her to go to the ER to get checked out since she was out of town. She stated that her insurance will go in the 1st of May. Please advise

## 2013-07-03 NOTE — Telephone Encounter (Signed)
Agree 

## 2013-07-23 ENCOUNTER — Telehealth: Payer: Self-pay | Admitting: Orthopedic Surgery

## 2013-07-23 NOTE — Telephone Encounter (Signed)
No appt needed   Tell her to do contrast baths for 3 weeks

## 2013-07-23 NOTE — Telephone Encounter (Signed)
Advised patient of Dr. Harrison's reply. 

## 2013-07-23 NOTE — Telephone Encounter (Signed)
Routing to Dr Harrison 

## 2013-07-23 NOTE — Telephone Encounter (Signed)
Patient called to relay that her ankle area is still having some swelling; she was seen 05/14/13 for ankle sprain.  I reviewed the instructions regarding ice, elevating, per office visit note. She states she is concerned, and asking if any recommendation, or schedule appointment?  Her ph# is [phone].

## 2013-08-17 ENCOUNTER — Telehealth: Payer: Self-pay | Admitting: Internal Medicine

## 2013-08-17 NOTE — Telephone Encounter (Signed)
Pt has ov with AS on 08/22/13

## 2013-08-17 NOTE — Telephone Encounter (Signed)
Patient c/o abdominal pain, nausea & vomiting , she just came back from [place] shes not sure if she has eaten something that has got her stomach upset or if its Gallbladder, please advise?

## 2013-08-17 NOTE — Telephone Encounter (Signed)
Tried to call pt- NA 

## 2013-08-20 ENCOUNTER — Telehealth: Payer: Self-pay | Admitting: Gastroenterology

## 2013-08-20 NOTE — Telephone Encounter (Signed)
Use Phenergan and dicyclomine prescribed. If no better by the time prep is to start, let us  know

## 2013-08-20 NOTE — Telephone Encounter (Signed)
PT CALLED HAVING TROUBLE WITH DIARRHEA, VOMITING AND ABDOMINAL PAIN,SUBJECTIVE: SCHEDULED FOR TCS WED. RX CALLED INTO [facility] FOR PHENERGAN AND BENTYL. PT ASKED TO CALL OFC MON IF SX NOT IMPROVED/CONTROLLED.

## 2013-08-22 ENCOUNTER — Encounter (INDEPENDENT_AMBULATORY_CARE_PROVIDER_SITE_OTHER): Payer: Self-pay

## 2013-08-22 ENCOUNTER — Ambulatory Visit (INDEPENDENT_AMBULATORY_CARE_PROVIDER_SITE_OTHER): Payer: Self-pay | Admitting: Gastroenterology

## 2013-08-22 ENCOUNTER — Encounter: Payer: Self-pay | Admitting: Gastroenterology

## 2013-08-22 ENCOUNTER — Telehealth: Payer: Self-pay | Admitting: General Practice

## 2013-08-22 ENCOUNTER — Other Ambulatory Visit: Payer: Self-pay | Admitting: Internal Medicine

## 2013-08-22 ENCOUNTER — Telehealth: Payer: Self-pay | Admitting: Gastroenterology

## 2013-08-22 VITALS — BP 126/82 | HR 82 | Temp 97.0°F | Resp 18 | Ht 68.0 in | Wt 219.4 lb

## 2013-08-22 DIAGNOSIS — D126 Benign neoplasm of colon, unspecified: Secondary | ICD-10-CM

## 2013-08-22 DIAGNOSIS — R1011 Right upper quadrant pain: Secondary | ICD-10-CM

## 2013-08-22 DIAGNOSIS — R197 Diarrhea, unspecified: Secondary | ICD-10-CM

## 2013-08-22 DIAGNOSIS — K529 Noninfective gastroenteritis and colitis, unspecified: Secondary | ICD-10-CM

## 2013-08-22 DIAGNOSIS — D3A026 Benign carcinoid tumor of the rectum: Secondary | ICD-10-CM

## 2013-08-22 MED ORDER — OXYCODONE-ACETAMINOPHEN 5-325 MG PO TABS: 1.0000 | ORAL_TABLET | ORAL | Status: DC | PRN

## 2013-08-22 MED ORDER — PEG 3350-KCL-NA BICARB-NACL 420 G PO SOLR: 4000.0000 mL | ORAL | Status: DC

## 2013-08-22 NOTE — Assessment & Plan Note (Signed)
Colonoscopy due now.

## 2013-08-22 NOTE — Assessment & Plan Note (Signed)
Chronic. Gallbladder remains in situ. HIDA with normal EF but reproduction of pain. EGD on file. After colonoscopy, consider elective referral to general surgery to discuss cholecystectomy. Likely component of chronic abdominal pain. Currently, no PPI. Add Dexilant daily. Samples provided and patient assistance forms provided due to lack of insurance.

## 2013-08-22 NOTE — Assessment & Plan Note (Signed)
Stool studies negative Feb 2015. Unable to tolerate Bentyl due to side effects. Levsin too expensive. Trial Lactose free diet. Celiac serologies. ?Xifaxan course.

## 2013-08-22 NOTE — Progress Notes (Signed)
Referring Provider: No ref. provider found Primary Care Physician:  No PCP Per Patient Primary GI: Dr. Gala Romney   Chief Complaint  Patient presents with  . Follow-up  . 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 Due for early interval colonoscopy. Chronic diarrhea and abdominal pain.     Bentyl makes her sleepy. Started breaking out in a rash. Under her arms and groin area. Never picked up Levsin due to cost. NO PPI currently.   N/V, Phenergan helps significantly. Persistent diarrhea. Burning her bottom. Stool studies negative in Feb 2015. Tried Imodium, stopped loose stools for about an hour. RUQ discomfort, lower abdominal discomfort. Hurts to eat. Gets full easily. Ice cream one night, pizza, worsened diarrhea.   Past Medical History  Diagnosis Date  . Right ankle injury   . Colon polyp   . Rectal polyp   . Carcinoid tumor of rectum   . 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 Colonoscopy with esophagogastroduodenoscopy (egd) N/A 05/16/2013    Dr. Gala Romney: rectal polyp with low grade carcinoid tumor, colon polyp with tubulovillous adenoma with high grade dysplasia. Needs repeat colonoscopy in 3 months. EGD with antral erosions, duodenal bulb erosions, superficial ulceration. path with mild chronic gastritis     Current Outpatient Prescriptions  Medication Sig Dispense Refill  . HYDROcodone-acetaminophen (NORCO) 5-325 MG per tablet Take 1 tablet by mouth every 6 (six) hours as needed for moderate pain.  90 tablet  0  . ondansetron (ZOFRAN) 4 MG tablet Take 1 tablet (4 mg total) by mouth every 6 (six) hours.  12 tablet  0  . oxyCODONE-acetaminophen (PERCOCET/ROXICET) 5-325 MG per tablet Take 1 tablet by mouth every 4 (four) hours as  needed for severe pain.  15 tablet  0  . promethazine (PHENERGAN) 25 MG tablet Take 25 mg by mouth every 6 (six) hours as needed for nausea or vomiting.      . polyethylene glycol-electrolytes (TRILYTE) 420 G solution Take 4,000 mLs by mouth as directed.  4000 mL  0   No current facility-administered medications for this visit.    Allergies as of 08/22/2013 - Review Complete 08/22/2013  Allergen Reaction Noted  . Codeine Nausea And Vomiting 12/15/2010  . Hydromorphone hcl Nausea And Vomiting 12/15/2010  . Nsaids  05/20/2013  . Penicillins Hives 12/15/2010  . Bentyl [dicyclomine hcl] Rash 08/22/2013  . Latex Rash 05/10/2013  . Tape Itching and Rash 05/10/2013    Family History  Problem Relation Age of Onset  . Colon cancer Maternal Grandmother   . Cervical cancer Mother   . Asthma    . Diabetes      History   Social History  . Marital Status: Married    Spouse Name: N/A    Number of Children: N/A  . Years of Education: N/A   Social History Main Topics  . Smoking status: Current Every Day Smoker -- 1.50 packs/day for 11 years    Types: Cigarettes  . Smokeless tobacco: None  . Alcohol Use: Yes     Comment: occasionally  . Drug Use: No  . Sexual Activity: None   Other Topics Concern  . None   Social History Narrative  . None    Review  of Systems: As mentioned in HPI.   Physical Exam: BP 126/82  Pulse 82  Temp(Src) 97 F (36.1 C) (Oral)  Resp 18  Ht 5\' 8"  (1.727 m)  Wt 219 lb 6.4 oz (99.519 kg)  BMI 33.37 kg/m2 General:   Alert and oriented. No distress noted. Pleasant and cooperative.  Head:  Normocephalic and atraumatic. Eyes:  Conjuctiva clear without scleral icterus. Mouth:  Oral mucosa pink and moist. Good dentition. No lesions. Heart:  S1, S2 present without murmurs, rubs, or gallops. Regular rate and rhythm. Abdomen:  +BS, soft, non-tender and non-distended. No rebound or guarding. No HSM or masses noted. Msk:  Symmetrical without gross  deformities. Normal posture. Extremities:  Without edema. Neurologic:  Alert and  oriented x4;  grossly normal neurologically. Skin:  Intact without significant lesions or rashes. Psych:  Alert and cooperative. Normal mood and affect.

## 2013-08-22 NOTE — Patient Instructions (Signed)
Start taking Dexilant daily for reflux. We have provided samples and patient assistance forms to cover this.   Follow the lactose free diet strictly.   Please have blood work done today.  We have scheduled you for a colonoscopy with Dr. Gala Romney in the near future.

## 2013-08-22 NOTE — Telephone Encounter (Signed)
I spoke with the patient and explained to her that she needed to turn in her 2014 tax return to complete his financial application process.

## 2013-08-22 NOTE — Telephone Encounter (Signed)
Diet sheet in the mail to the pt.

## 2013-08-22 NOTE — Telephone Encounter (Signed)
I forgot to provide lactose free diet handout to patient. Please send one in the mail. Thanks!  Vicente Males

## 2013-08-22 NOTE — Assessment & Plan Note (Signed)
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 Due for surveillance now.   Proceed with TCS with Dr. Gala Romney in near future: the risks, benefits, and alternatives have been discussed with the patient in detail. The patient states understanding and desires to proceed. PROPOFOL due to prior failed sedation

## 2013-08-23 LAB — TISSUE TRANSGLUTAMINASE, IGA: Tissue Transglutaminase Ab, IgA: 3.4 U/mL (ref <20)

## 2013-08-23 LAB — IGA: IGA: 178 mg/dL (ref 69–380)

## 2013-08-24 ENCOUNTER — Emergency Department (HOSPITAL_COMMUNITY)
Admission: EM | Admit: 2013-08-24 | Discharge: 2013-08-24 | Disposition: A | Discharge status: Home / Self Care | Payer: Self-pay | Attending: Emergency Medicine | Admitting: Emergency Medicine

## 2013-08-24 ENCOUNTER — Emergency Department (HOSPITAL_COMMUNITY): Payer: Self-pay

## 2013-08-24 ENCOUNTER — Encounter (HOSPITAL_COMMUNITY): Payer: Self-pay | Admitting: Emergency Medicine

## 2013-08-24 DIAGNOSIS — Y9389 Activity, other specified: Secondary | ICD-10-CM | POA: Insufficient documentation

## 2013-08-24 DIAGNOSIS — Z91040 Latex allergy status: Secondary | ICD-10-CM | POA: Insufficient documentation

## 2013-08-24 DIAGNOSIS — Z79899 Other long term (current) drug therapy: Secondary | ICD-10-CM | POA: Insufficient documentation

## 2013-08-24 DIAGNOSIS — F17200 Nicotine dependence, unspecified, uncomplicated: Secondary | ICD-10-CM | POA: Insufficient documentation

## 2013-08-24 DIAGNOSIS — Z860100 Personal history of colon polyps, unspecified: Secondary | ICD-10-CM | POA: Insufficient documentation

## 2013-08-24 DIAGNOSIS — Y929 Unspecified place or not applicable: Secondary | ICD-10-CM | POA: Insufficient documentation

## 2013-08-24 DIAGNOSIS — W3189XA Contact with other specified machinery, initial encounter: Secondary | ICD-10-CM | POA: Insufficient documentation

## 2013-08-24 DIAGNOSIS — Z87828 Personal history of other (healed) physical injury and trauma: Secondary | ICD-10-CM | POA: Insufficient documentation

## 2013-08-24 DIAGNOSIS — Z859 Personal history of malignant neoplasm, unspecified: Secondary | ICD-10-CM | POA: Insufficient documentation

## 2013-08-24 DIAGNOSIS — Z880 Allergy status to penicillin: Secondary | ICD-10-CM | POA: Insufficient documentation

## 2013-08-24 DIAGNOSIS — S9000XA Contusion of unspecified ankle, initial encounter: Secondary | ICD-10-CM

## 2013-08-24 DIAGNOSIS — S9030XA Contusion of unspecified foot, initial encounter: Secondary | ICD-10-CM | POA: Insufficient documentation

## 2013-08-24 MED ORDER — HYDROCODONE-ACETAMINOPHEN 5-325 MG PO TABS: 2.0000 | ORAL_TABLET | ORAL | Status: DC | PRN

## 2013-08-24 MED ORDER — HYDROCODONE-ACETAMINOPHEN 5-325 MG PO TABS
2.0000 | ORAL_TABLET | Freq: Once | ORAL | Status: AC
  Administered 2013-08-24: 2 via ORAL
  Filled 2013-08-24: qty 2

## 2013-08-24 NOTE — ED Notes (Signed)
Patient with continued pain at this time. Respirations even and unlabored. Skin warm/dry. Discharge instructions reviewed with patient at this time. Patient given opportunity to voice concerns/ask questions.  Patient discharged at this time and left Emergency Department with steady gait.   

## 2013-08-24 NOTE — ED Provider Notes (Signed)
Medical screening examination/treatment/procedure(s) were performed by non-physician practitioner and as supervising physician I was immediately available for consultation/collaboration.   EKG Interpretation None        Orpah Greek, MD 08/24/13 2221
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
## 2013-08-24 NOTE — Discharge Instructions (Signed)
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
## 2013-08-27 ENCOUNTER — Encounter (HOSPITAL_COMMUNITY): Payer: Self-pay

## 2013-08-27 MED FILL — Hydrocodone-Acetaminophen Tab 5-325 MG: ORAL | Qty: 6 | Status: AC

## 2013-08-27 NOTE — Telephone Encounter (Signed)
Patient called and stated she only has 1 box left of Dexilant left, she states that it is helping a lot, please advise?

## 2013-08-28 NOTE — Progress Notes (Signed)
NO PCP

## 2013-08-29 NOTE — Telephone Encounter (Signed)
dexilant samples at the front desk. Pt is aware. Included another copy of the dexilant pt assistance forms.

## 2013-08-30 NOTE — Patient Instructions (Signed)
Your procedure is scheduled on:  09/06/13  Report to [facility] at 07:30 AM.  Call this number if you have problems the morning of surgery: [phone]   Remember:   Do not eat food or drink liquids after midnight.   Take these medicines the morning of surgery with A SIP OF WATER:    Do not wear jewelry, make-up or nail polish.  Do not wear lotions, powders, or perfumes.   Do not shave 48 hours prior to surgery. Men may shave face and neck.  Do not bring valuables to the hospital.  [facility] is not responsible for any belongings or valuables.               Contacts, dentures or bridgework may not be worn into surgery.  Leave suitcase in the car. After surgery it may be brought to your room.  For patients admitted to the hospital, discharge time is determined by your treatment team.               Patients discharged the day of surgery will not be allowed to drive home.    Special Instructions: Shower using Hibiclens the night before surgery and the morning of surgery.   Please read over the following fact sheets that you were given: Anesthesia Post-op Instructions    Colonoscopy A colonoscopy is an exam to look at the entire large intestine (colon). This exam can help find problems such as tumors, polyps, inflammation, and areas of bleeding. The exam takes about 1 hour.  LET [facility] CARE PROVIDER KNOW ABOUT:   Any allergies you have.  All medicines you are taking, including vitamins, herbs, eye drops, creams, and over-the-counter medicines.  Previous problems you or members of your family have had with the use of anesthetics.  Any blood disorders you have.  Previous surgeries you have had.  Medical conditions you have. RISKS AND COMPLICATIONS  Generally, this is a safe procedure. However, as with any procedure, complications can occur. Possible complications include:  Bleeding.  Tearing or rupture of the colon wall.  Reaction to medicines given during the  exam.  Infection (rare). BEFORE THE PROCEDURE   Ask your health care provider about changing or stopping your regular medicines.  You may be prescribed an oral bowel prep. This involves drinking a large amount of medicated liquid, starting the day before your procedure. The liquid will cause you to have multiple loose stools until your stool is almost clear or light green. This cleans out your colon in preparation for the procedure.  Do not eat or drink anything else once you have started the bowel prep, unless your health care provider tells you it is safe to do so.  Arrange for someone to drive you home after the procedure. PROCEDURE   You will be given medicine to help you relax (sedative).  You will lie on your side with your knees bent.  A long, flexible tube with a light and camera on the end (colonoscope) will be inserted through the rectum and into the colon. The camera sends video back to a computer screen as it moves through the colon. The colonoscope also releases carbon dioxide gas to inflate the colon. This helps your health care provider see the area better.  During the exam, your health care provider may take a small tissue sample (biopsy) to be examined under a microscope if any abnormalities are found.  The exam is finished when the entire colon has been viewed. AFTER THE  PROCEDURE   Do not drive for 24 hours after the exam.  You may have a small amount of blood in your stool.  You may pass moderate amounts of gas and have mild abdominal cramping or bloating. This is caused by the gas used to inflate your colon during the exam.  Ask when your test results will be ready and how you will get your results. Make sure you get your test results. Document Released: 02/27/2000 Document Revised: 12/20/2012 Document Reviewed: 11/06/2012 [facility] Patient Information 2015 [place], [place]. This information is not intended to replace advice given to you by your health care  provider. Make sure you discuss any questions you have with your health care provider.    PATIENT INSTRUCTIONS POST-ANESTHESIA  IMMEDIATELY FOLLOWING SURGERY:  Do not drive or operate machinery for the first twenty four hours after surgery.  Do not make any important decisions for twenty four hours after surgery or while taking narcotic pain medications or sedatives.  If you develop intractable nausea and vomiting or a severe headache please notify your doctor immediately.  FOLLOW-UP:  Please make an appointment with your surgeon as instructed. You do not need to follow up with anesthesia unless specifically instructed to do so.  WOUND CARE INSTRUCTIONS (if applicable):  Keep a dry clean dressing on the anesthesia/puncture wound site if there is drainage.  Once the wound has quit draining you may leave it open to air.  Generally you should leave the bandage intact for twenty four hours unless there is drainage.  If the epidural site drains for more than 36-48 hours please call the anesthesia department.  QUESTIONS?:  Please feel free to call your physician or the hospital operator if you have any questions, and they will be happy to assist you.

## 2013-08-31 ENCOUNTER — Encounter (HOSPITAL_COMMUNITY)
Admission: RE | Admit: 2013-08-31 | Discharge: 2013-08-31 | Disposition: A | Discharge status: Home / Self Care | Payer: Self-pay | Source: Ambulatory Visit | Attending: Internal Medicine | Admitting: Internal Medicine

## 2013-08-31 ENCOUNTER — Encounter (HOSPITAL_COMMUNITY): Payer: Self-pay

## 2013-08-31 DIAGNOSIS — Z01818 Encounter for other preprocedural examination: Secondary | ICD-10-CM | POA: Insufficient documentation

## 2013-08-31 DIAGNOSIS — Z01812 Encounter for preprocedural laboratory examination: Secondary | ICD-10-CM | POA: Insufficient documentation

## 2013-08-31 HISTORY — DX: Gastro-esophageal reflux disease without esophagitis: K21.9

## 2013-08-31 LAB — BASIC METABOLIC PANEL
BUN: 16 mg/dL (ref 6–23)
CHLORIDE: 103 meq/L (ref 96–112)
CO2: 25 mEq/L (ref 19–32)
Calcium: 9.7 mg/dL (ref 8.4–10.5)
Creatinine, Ser: 0.72 mg/dL (ref 0.50–1.10)
Glucose, Bld: 83 mg/dL (ref 70–99)
POTASSIUM: 4.3 meq/L (ref 3.7–5.3)
SODIUM: 141 meq/L (ref 137–147)

## 2013-08-31 LAB — HCG, SERUM, QUALITATIVE: PREG SERUM: NEGATIVE

## 2013-08-31 LAB — HEMOGLOBIN AND HEMATOCRIT, BLOOD
HEMATOCRIT: 44.3 % (ref 36.0–46.0)
HEMOGLOBIN: 15.4 g/dL — AB (ref 12.0–15.0)

## 2013-08-31 NOTE — Pre-Procedure Instructions (Signed)
Patient given information to sign up for my chart at home. 

## 2013-09-05 NOTE — Progress Notes (Signed)
Quick Note:  Negative celiac serologies. TCS as planned. ______

## 2013-09-06 ENCOUNTER — Encounter (HOSPITAL_COMMUNITY)
Admission: RE | Disposition: A | Discharge status: Home / Self Care | Payer: Self-pay | Source: Ambulatory Visit | Attending: Internal Medicine

## 2013-09-06 ENCOUNTER — Encounter (HOSPITAL_COMMUNITY): Payer: Self-pay | Admitting: Anesthesiology

## 2013-09-06 ENCOUNTER — Ambulatory Visit (HOSPITAL_COMMUNITY)
Admission: RE | Admit: 2013-09-06 | Discharge: 2013-09-06 | Disposition: A | Discharge status: Home / Self Care | Payer: Self-pay | Source: Ambulatory Visit | Attending: Internal Medicine | Admitting: Internal Medicine

## 2013-09-06 ENCOUNTER — Ambulatory Visit (HOSPITAL_COMMUNITY): Payer: Self-pay | Admitting: Anesthesiology

## 2013-09-06 DIAGNOSIS — Z860100 Personal history of colon polyps, unspecified: Secondary | ICD-10-CM | POA: Insufficient documentation

## 2013-09-06 DIAGNOSIS — D3A026 Benign carcinoid tumor of the rectum: Secondary | ICD-10-CM

## 2013-09-06 DIAGNOSIS — F17200 Nicotine dependence, unspecified, uncomplicated: Secondary | ICD-10-CM | POA: Insufficient documentation

## 2013-09-06 DIAGNOSIS — Z09 Encounter for follow-up examination after completed treatment for conditions other than malignant neoplasm: Secondary | ICD-10-CM | POA: Insufficient documentation

## 2013-09-06 HISTORY — PX: COLONOSCOPY WITH PROPOFOL: SHX5780

## 2013-09-06 SURGERY — COLONOSCOPY WITH PROPOFOL
Anesthesia: Monitor Anesthesia Care | Site: Rectum

## 2013-09-06 MED ORDER — GLYCOPYRROLATE 0.2 MG/ML IJ SOLN
INTRAMUSCULAR | Status: AC
  Filled 2013-09-06: qty 1

## 2013-09-06 MED ORDER — PROPOFOL INFUSION 10 MG/ML OPTIME
INTRAVENOUS | Status: DC | PRN
  Administered 2013-09-06: 75 ug/kg/min via INTRAVENOUS
  Administered 2013-09-06: 200 ug/kg/min via INTRAVENOUS

## 2013-09-06 MED ORDER — ONDANSETRON HCL 4 MG/2ML IJ SOLN
4.0000 mg | Freq: Once | INTRAMUSCULAR | Status: AC
  Administered 2013-09-06: 4 mg via INTRAVENOUS

## 2013-09-06 MED ORDER — PROPOFOL 10 MG/ML IV BOLUS
INTRAVENOUS | Status: AC
  Filled 2013-09-06: qty 20

## 2013-09-06 MED ORDER — FENTANYL CITRATE 0.05 MG/ML IJ SOLN
INTRAMUSCULAR | Status: AC
  Filled 2013-09-06: qty 2

## 2013-09-06 MED ORDER — FENTANYL CITRATE 0.05 MG/ML IJ SOLN
INTRAMUSCULAR | Status: DC | PRN
  Administered 2013-09-06: 25 ug via INTRAVENOUS
  Administered 2013-09-06: 50 ug via INTRAVENOUS
  Administered 2013-09-06: 25 ug via INTRAVENOUS

## 2013-09-06 MED ORDER — MIDAZOLAM HCL 2 MG/2ML IJ SOLN
INTRAMUSCULAR | Status: AC
  Filled 2013-09-06: qty 2

## 2013-09-06 MED ORDER — ONDANSETRON HCL 4 MG/2ML IJ SOLN
INTRAMUSCULAR | Status: AC
  Filled 2013-09-06: qty 2

## 2013-09-06 MED ORDER — MIDAZOLAM HCL 2 MG/2ML IJ SOLN
1.0000 mg | INTRAMUSCULAR | Status: AC | PRN
  Administered 2013-09-06 (×3): 2 mg via INTRAVENOUS

## 2013-09-06 MED ORDER — ONDANSETRON HCL 4 MG/2ML IJ SOLN: 4.0000 mg | Freq: Once | INTRAMUSCULAR | Status: DC | PRN

## 2013-09-06 MED ORDER — GLYCOPYRROLATE 0.2 MG/ML IJ SOLN
0.2000 mg | Freq: Once | INTRAMUSCULAR | Status: AC
  Administered 2013-09-06: 0.2 mg via INTRAVENOUS

## 2013-09-06 MED ORDER — LIDOCAINE HCL (PF) 1 % IJ SOLN
INTRAMUSCULAR | Status: AC
  Filled 2013-09-06: qty 5

## 2013-09-06 MED ORDER — FENTANYL CITRATE 0.05 MG/ML IJ SOLN: 25.0000 ug | INTRAMUSCULAR | Status: DC | PRN

## 2013-09-06 MED ORDER — LACTATED RINGERS IV SOLN
INTRAVENOUS | Status: DC
  Administered 2013-09-06: 09:00:00 via INTRAVENOUS

## 2013-09-06 MED ORDER — STERILE WATER FOR IRRIGATION IR SOLN
Status: DC | PRN
  Administered 2013-09-06: 11:00:00

## 2013-09-06 MED ORDER — FENTANYL CITRATE 0.05 MG/ML IJ SOLN
25.0000 ug | INTRAMUSCULAR | Status: AC
  Administered 2013-09-06 (×2): 25 ug via INTRAVENOUS

## 2013-09-06 SURGICAL SUPPLY — 9 items
FLOOR PAD 36X40 (MISCELLANEOUS) ×3
KIT CLEAN ENDO COMPLIANCE (KITS) ×3 IMPLANT
LUBRICANT JELLY 4.5OZ STERILE (MISCELLANEOUS) ×2 IMPLANT
MANIFOLD NEPTUNE II (INSTRUMENTS) ×2 IMPLANT
PAD FLOOR 36X40 (MISCELLANEOUS) IMPLANT
TUBING ENDO SMARTCAP PENTAX (MISCELLANEOUS) ×2 IMPLANT
TUBING INSUFFLATOR CO2MPACT (TUBING) ×2 IMPLANT
TUBING IRRIGATION ENDOGATOR (MISCELLANEOUS) ×2 IMPLANT
WATER STERILE IRR 1000ML POUR (IV SOLUTION) ×2 IMPLANT

## 2013-09-06 NOTE — Op Note (Signed)
[facility] [address] [place], [address]   COLONOSCOPY PROCEDURE REPORT  PATIENT: Patricia Rivers, Patricia Rivers  MR#:         [id number] BIRTHDATE: 1985-07-20 , 28  yrs. old GENDER: Female ENDOSCOPIST: Bridgette Habermann, MD FACP [facility] REFERRED BY:     none PROCEDURE DATE:  09/06/2013 PROCEDURE:     Ileocolonoscopy- surveillance  INDICATIONS: History tubulovillous adenoma with multifocal high-grade dysplasia removed from descending colon 3 months ago-piecemeal resection. Also, small carcinoid removed from the rectum.  INFORMED CONSENT:  The risks, benefits, alternatives and imponderables including but not limited to bleeding, perforation as well as the possibility of a missed lesion have been reviewed.  The potential for biopsy, lesion removal, etc. have also been discussed.  Questions have been answered.  All parties agreeable. Please see the history and physical in the medical record for more information.  MEDICATIONS: Deep sedation per Dr. [facility]  DESCRIPTION OF PROCEDURE:  After a digital rectal exam was performed, the     colonoscope was advanced from the anus through the rectum and colon to the area of the cecum, ileocecal valve and appendiceal orifice.  The cecum was deeply intubated.  These structures were well-seen and photographed for the record.  From the level of the cecum and ileocecal valve, the scope was slowly and cautiously withdrawn.  The mucosal surfaces were carefully surveyed utilizing scope tip deflection to facilitate fold flattening as needed.  The scope was pulled down into the rectum where a thorough examination including retroflexion was performed.     FINDINGS:  Adequate preparation. Rectum appeared normal.  No evidence of any residual polyp. Previously noted descending colon polypectomy site-tattooed- appearing normal with no residual polyp tissue seen.The remainder of the colonic mucosa appeared normal. The distal 5  cm of terminal ileum mucosa appeared normal. Please note all photographs taken were not captured by the equipment due to an unknown technical problem.  THERAPEUTIC / DIAGNOSTIC MANEUVERS PERFORMED:  none  COMPLICATIONS: none  CECAL WITHDRAWAL TIME:  12 minutes  IMPRESSION:  Normal ileocolonoscopy. No evidence of residual polyp anywhere  RECOMMENDATIONS: Office visit with [place] in 3 months. Plan for surveillance colonoscopy in 3 years.   Of note, went back and looked at prior colonoscopy photographs. The rectal carcinoid was no more than about 2-2 and half millimeters in diameter. I took a photograph after pinch biopsy. This area was widely excised.   _______________________________ eSigned:  R. Garfield Cornea, MD FACP [facility] 09/06/2013 11:22 AM   CC:    PATIENT NAME:  Patricia Rivers, Patricia Rivers MR#: [id number]

## 2013-09-06 NOTE — Interval H&P Note (Signed)
History and Physical Interval Note:  09/06/2013 10:38 AM  Patricia Rivers  has presented today for surgery, with the diagnosis of ADENOMATOUS POLYPS, CARCINOID RECTAL TUMOR  The various methods of treatment have been discussed with the patient and family. After consideration of risks, benefits and other options for treatment, the patient has consented to  Procedure(s) with comments: COLONOSCOPY WITH PROPOFOL (N/A) - 9:00 as a surgical intervention .  The patient's history has been reviewed, patient examined, no change in status, stable for surgery.  I have reviewed the patient's chart and labs.  Questions were answered to the patient's satisfaction.   No change. Colonoscopy per plan.The risks, benefits, limitations, alternatives and imponderables have been reviewed with the patient. Questions have been answered. All parties are agreeable.   Manus Rudd

## 2013-09-06 NOTE — Anesthesia Procedure Notes (Signed)
Procedure Name: MAC Date/Time: 09/06/2013 10:44 AM Performed by: Vista Deck Pre-anesthesia Checklist: Patient identified, Emergency Drugs available, Suction available, Timeout performed and Patient being monitored Patient Re-evaluated:Patient Re-evaluated prior to inductionOxygen Delivery Method: Non-rebreather mask

## 2013-09-06 NOTE — Discharge Instructions (Signed)

## 2013-09-06 NOTE — Anesthesia Preprocedure Evaluation (Addendum)
Anesthesia Evaluation  Patient identified by MRN, date of birth, ID band Patient awake    Reviewed: Allergy & Precautions, H&P , NPO status , Patient's Chart, lab work & pertinent test results  Airway Mallampati: II TM Distance: >3 FB     Dental  (+) Teeth Intact   Pulmonary Current Smoker (am cough),  breath sounds clear to auscultation        Cardiovascular negative cardio ROS  Rhythm:Regular Rate:Normal     Neuro/Psych    GI/Hepatic GERD-  Medicated and Controlled,  Endo/Other    Renal/GU      Musculoskeletal   Abdominal   Peds  Hematology   Anesthesia Other Findings   Reproductive/Obstetrics                          Anesthesia Physical Anesthesia Plan  ASA: II  Anesthesia Plan: MAC   Post-op Pain Management:    Induction: Intravenous  Airway Management Planned: Simple Face Mask  Additional Equipment:   Intra-op Plan:   Post-operative Plan:   Informed Consent: I have reviewed the patients History and Physical, chart, labs and discussed the procedure including the risks, benefits and alternatives for the proposed anesthesia with the patient or authorized representative who has indicated his/her understanding and acceptance.     Plan Discussed with:   Anesthesia Plan Comments:         Anesthesia Quick Evaluation

## 2013-09-06 NOTE — H&P (View-Only) (Signed)
Referring Provider: No ref. provider found Primary Care Physician:  No PCP Per Patient Primary GI: Dr. Gala Romney   Chief Complaint  Patient presents with  . Follow-up  . 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 Due for early interval colonoscopy. Chronic diarrhea and abdominal pain.     Bentyl makes her sleepy. Started breaking out in a rash. Under her arms and groin area. Never picked up Levsin due to cost. NO PPI currently.   N/V, Phenergan helps significantly. Persistent diarrhea. Burning her bottom. Stool studies negative in Feb 2015. Tried Imodium, stopped loose stools for about an hour. RUQ discomfort, lower abdominal discomfort. Hurts to eat. Gets full easily. Ice cream one night, pizza, worsened diarrhea.   Past Medical History  Diagnosis Date  . Right ankle injury   . Colon polyp   . Rectal polyp   . Carcinoid tumor of rectum   . 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 Colonoscopy with esophagogastroduodenoscopy (egd) N/A 05/16/2013    Dr. Gala Romney: rectal polyp with low grade carcinoid tumor, colon polyp with tubulovillous adenoma with high grade dysplasia. Needs repeat colonoscopy in 3 months. EGD with antral erosions, duodenal bulb erosions, superficial ulceration. path with mild chronic gastritis     Current Outpatient Prescriptions  Medication Sig Dispense Refill  . HYDROcodone-acetaminophen (NORCO) 5-325 MG per tablet Take 1 tablet by mouth every 6 (six) hours as needed for moderate pain.  90 tablet  0  . ondansetron (ZOFRAN) 4 MG tablet Take 1 tablet (4 mg total) by mouth every 6 (six) hours.  12 tablet  0  . oxyCODONE-acetaminophen (PERCOCET/ROXICET) 5-325 MG per tablet Take 1 tablet by mouth every 4 (four) hours as  needed for severe pain.  15 tablet  0  . promethazine (PHENERGAN) 25 MG tablet Take 25 mg by mouth every 6 (six) hours as needed for nausea or vomiting.      . polyethylene glycol-electrolytes (TRILYTE) 420 G solution Take 4,000 mLs by mouth as directed.  4000 mL  0   No current facility-administered medications for this visit.    Allergies as of 08/22/2013 - Review Complete 08/22/2013  Allergen Reaction Noted  . Codeine Nausea And Vomiting 12/15/2010  . Hydromorphone hcl Nausea And Vomiting 12/15/2010  . Nsaids  05/20/2013  . Penicillins Hives 12/15/2010  . Bentyl [dicyclomine hcl] Rash 08/22/2013  . Latex Rash 05/10/2013  . Tape Itching and Rash 05/10/2013    Family History  Problem Relation Age of Onset  . Colon cancer Maternal Grandmother   . Cervical cancer Mother   . Asthma    . Diabetes      History   Social History  . Marital Status: Married    Spouse Name: N/A    Number of Children: N/A  . Years of Education: N/A   Social History Main Topics  . Smoking status: Current Every Day Smoker -- 1.50 packs/day for 11 years    Types: Cigarettes  . Smokeless tobacco: None  . Alcohol Use: Yes     Comment: occasionally  . Drug Use: No  . Sexual Activity: None   Other Topics Concern  . None   Social History Narrative  . None    Review  of Systems: As mentioned in HPI.   Physical Exam: BP 126/82  Pulse 82  Temp(Src) 97 F (36.1 C) (Oral)  Resp 18  Ht 5\' 8"  (1.727 m)  Wt 219 lb 6.4 oz (99.519 kg)  BMI 33.37 kg/m2 General:   Alert and oriented. No distress noted. Pleasant and cooperative.  Head:  Normocephalic and atraumatic. Eyes:  Conjuctiva clear without scleral icterus. Mouth:  Oral mucosa pink and moist. Good dentition. No lesions. Heart:  S1, S2 present without murmurs, rubs, or gallops. Regular rate and rhythm. Abdomen:  +BS, soft, non-tender and non-distended. No rebound or guarding. No HSM or masses noted. Msk:  Symmetrical without gross  deformities. Normal posture. Extremities:  Without edema. Neurologic:  Alert and  oriented x4;  grossly normal neurologically. Skin:  Intact without significant lesions or rashes. Psych:  Alert and cooperative. Normal mood and affect.

## 2013-09-06 NOTE — Transfer of Care (Addendum)
Immediate Anesthesia Transfer of Care Note  Patient: Patricia Rivers  Procedure(s) Performed: Procedure(s) (LRB): COLONOSCOPY WITH PROPOFOL (N/A)  Patient Location: PACU  Anesthesia Type: MAC  Level of Consciousness: awake  Airway & Oxygen Therapy: Patient Spontanous Breathing. Non rebreather Post-op Assessment: Report given to PACU RN, Post -op Vital signs reviewed and stable and Patient moving all extremities  Post vital signs: Reviewed and stable  Complications: No apparent anesthesia complications

## 2013-09-06 NOTE — Anesthesia Postprocedure Evaluation (Signed)
  Anesthesia Post-op Note  Patient: Patricia Rivers  Procedure(s) Performed: Procedure(s) with comments: COLONOSCOPY WITH PROPOFOL (N/A) - in cecum @ 1100; cecal withdrawal time - 12 minutes  Patient Location: PACU  Anesthesia Type:MAC  Level of Consciousness: sedated and patient cooperative  Airway and Oxygen Therapy: Patient Spontanous Breathing and non-rebreather face mask  Post-op Pain: none  Post-op Assessment: Post-op Vital signs reviewed, Patient's Cardiovascular Status Stable, Respiratory Function Stable and Patent Airway  Post-op Vital Signs: Reviewed and stable  Last Vitals:  Filed Vitals:   09/06/13 1121  BP: 119/73  Pulse: 84  Temp: 36.6 C  Resp: 20    Complications: No apparent anesthesia complications

## 2013-09-07 ENCOUNTER — Encounter (HOSPITAL_COMMUNITY): Payer: Self-pay | Admitting: Internal Medicine

## 2013-10-02 ENCOUNTER — Telehealth: Payer: Self-pay | Admitting: Internal Medicine

## 2013-10-02 NOTE — Telephone Encounter (Signed)
She had other GI complaints not related to her colon. That was the main reason for followup in 3 months. I don't have any specific recommendations for any further testing prior to her office visit. Important followup test is a colonoscopy in 3 years.

## 2013-10-02 NOTE — Telephone Encounter (Signed)
Dr.Rourk- this pt is on the recall for September. Do you want her to have any testing prior to coming in for ov? Or wait until ov to decide?

## 2013-10-02 NOTE — Telephone Encounter (Signed)
Pt had LMOM asking when RMR wanted to follow up with her. I called her back and told her that she was on the recall for a follow in September. She said that RMR had some concerns about her digestive system and was wondering if he was going to order any testing. I told patient that I would send a note the RMR's nurse to see if RMR wants to wait until Sept or if this was something he wanted to do sooner. Please advise [phone]

## 2013-10-02 NOTE — Telephone Encounter (Signed)
Reminder in epic °

## 2013-10-05 ENCOUNTER — Encounter (HOSPITAL_COMMUNITY): Payer: Self-pay | Admitting: Emergency Medicine

## 2013-10-05 ENCOUNTER — Emergency Department (HOSPITAL_COMMUNITY)
Admission: EM | Admit: 2013-10-05 | Discharge: 2013-10-06 | Disposition: A | Discharge status: Home / Self Care | Payer: Self-pay | Attending: Emergency Medicine | Admitting: Emergency Medicine

## 2013-10-05 ENCOUNTER — Emergency Department (HOSPITAL_COMMUNITY): Payer: Self-pay

## 2013-10-05 DIAGNOSIS — Z87828 Personal history of other (healed) physical injury and trauma: Secondary | ICD-10-CM | POA: Insufficient documentation

## 2013-10-05 DIAGNOSIS — Z880 Allergy status to penicillin: Secondary | ICD-10-CM | POA: Insufficient documentation

## 2013-10-05 DIAGNOSIS — R079 Chest pain, unspecified: Secondary | ICD-10-CM | POA: Insufficient documentation

## 2013-10-05 DIAGNOSIS — K219 Gastro-esophageal reflux disease without esophagitis: Secondary | ICD-10-CM | POA: Insufficient documentation

## 2013-10-05 DIAGNOSIS — Z91040 Latex allergy status: Secondary | ICD-10-CM | POA: Insufficient documentation

## 2013-10-05 DIAGNOSIS — F17200 Nicotine dependence, unspecified, uncomplicated: Secondary | ICD-10-CM | POA: Insufficient documentation

## 2013-10-05 DIAGNOSIS — Z860100 Personal history of colon polyps, unspecified: Secondary | ICD-10-CM | POA: Insufficient documentation

## 2013-10-05 DIAGNOSIS — R071 Chest pain on breathing: Secondary | ICD-10-CM | POA: Insufficient documentation

## 2013-10-05 MED ORDER — HYDROCODONE-ACETAMINOPHEN 5-325 MG PO TABS: 2.0000 | ORAL_TABLET | ORAL | Status: DC | PRN

## 2013-10-05 MED ORDER — MORPHINE SULFATE 4 MG/ML IJ SOLN
4.0000 mg | Freq: Once | INTRAMUSCULAR | Status: AC
  Administered 2013-10-05: 4 mg via INTRAMUSCULAR
  Filled 2013-10-05: qty 1

## 2013-10-05 NOTE — ED Provider Notes (Signed)
CSN: [id number]     Arrival date & time 10/05/13  2043 History   First MD Initiated Contact with Patient 10/05/13 2103   This chart was scribed for Nat Christen, MD by Rosary Lively, ED scribe. This patient was seen in room APA05/[facility]05 and the patient's care was started at 9:18 PM.    Chief Complaint  Patient presents with  . Chest Pain    The history is provided by the patient. No language interpreter was used.   HPI Comments:  Patricia Rivers is a 28 y.o. female who presents to the Emergency Department complaining of severe, central sharp CP, onset 7:30AM. Pt states that pain has been ongoing, but pain became very severe at 9:00PM. Pt reports that she did mow grass with a push lawn mower on today. Pt states that pain becomes worse with deep breaths and palpation. Pt states that she smokes approximately one pack a day. No history of diabetes, hypertension, hypercholesterolemia.  No radiation of pain.   Past Medical History  Diagnosis Date  . Right ankle injury   . Colon polyp   . Rectal polyp   . Carcinoid tumor of rectum   . 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 GERD (gastroesophageal reflux disease)    Past Surgical History  Procedure Laterality Date  . Colonoscopy with esophagogastroduodenoscopy (egd) N/A 05/16/2013    Dr. Gala Romney: rectal polyp with low grade carcinoid tumor, colon polyp with tubulovillous adenoma with high grade dysplasia. Needs repeat colonoscopy in 3 months. EGD with antral erosions, duodenal bulb erosions, superficial ulceration. path with mild chronic gastritis   . Colonoscopy with propofol N/A 09/06/2013    Procedure: COLONOSCOPY WITH PROPOFOL;  Surgeon: Daneil Dolin, MD;  Location: [facility];  Service: Endoscopy;  Laterality: N/A;  in cecum @ 1100; cecal withdrawal time - 12 minutes   Family History  Problem Relation Age of Onset  . Colon cancer Maternal Grandmother   . Cervical cancer Mother   . Asthma    . Diabetes     History   Substance Use Topics  . Smoking status: Current Every Day Smoker -- 1.50 packs/day for 11 years    Types: Cigarettes  . Smokeless tobacco: Not on file  . Alcohol Use: Yes     Comment: occasionally   OB History   Grav Para Term Preterm Abortions TAB SAB Ect Mult Living                 Review of Systems  Cardiovascular: Positive for chest pain.  All other systems reviewed and are negative.     Allergies  Codeine; Hydromorphone hcl; Nsaids; Penicillins; Bentyl; Latex; and Tape  Home Medications   Prior to Admission medications   Medication Sig Start Date End Date Taking? Authorizing Provider  dexlansoprazole (DEXILANT) 60 MG capsule Take 60 mg by mouth daily.   Yes Historical Provider, MD  HYDROcodone-acetaminophen (NORCO) 5-325 MG per tablet Take 2 tablets by mouth every 4 (four) hours as needed. 10/05/13   Nat Christen, MD   BP 108/87  Pulse 68  Temp(Src) 98.3 F (36.8 C) (Oral)  Resp 19  Ht 5\' 8"  (1.727 m)  Wt 218 lb (98.884 kg)  BMI 33.15 kg/m2  SpO2 100% Physical Exam  Nursing note and vitals reviewed. Constitutional: She is oriented to person, place, and time. She appears well-developed and well-nourished.  HENT:  Head: Normocephalic and atraumatic.  Eyes: Conjunctivae and EOM are normal. Pupils are  equal, round, and reactive to light.  Neck: Normal range of motion. Neck supple.  Cardiovascular: Normal rate, regular rhythm and normal heart sounds.   Pulmonary/Chest: Effort normal and breath sounds normal. She exhibits tenderness.  Tenderness to chest wall.  Abdominal: Soft. Bowel sounds are normal.  Musculoskeletal: Normal range of motion.  Neurological: She is alert and oriented to person, place, and time.  Skin: Skin is warm and dry.  Psychiatric: She has a normal mood and affect. Her behavior is normal.    ED Course  Procedures  DIAGNOSTIC STUDIES: Oxygen Saturation is 100% on RA, normal by my interpretation.  COORDINATION OF CARE: 9:23 PM-Discussed  treatment plan which includes tests and pain medication with pt at bedside and pt agreed to plan.  No results found.   EKG Interpretation None      Date: 10/05/2013  Rate: 59 Rhythm: Sinus arrhythmia  QRS Axis: normal  Intervals: normal  ST/T Wave abnormalities: normal  Conduction Disutrbances: none  Narrative Interpretation: unremarkable    MDM   Final diagnoses:  Chest pain, unspecified chest pain type   Patient has very low risk for acute coronary syndrome or pulmonary embolism. She is tender to palpation of the chest wall. Pain is worse with deep breath. EKG shows no ST segment changes.  I personally performed the services described in this documentation, which was scribed in my presence. The recorded information has been reviewed and is accurate.      Nat Christen, MD 10/05/13 2329

## 2013-10-05 NOTE — ED Notes (Signed)
Dr. Lacinda Axon given repeat EKG

## 2013-10-05 NOTE — Discharge Instructions (Signed)
Pain medication. Stop smoking. Followup your Dr.

## 2013-10-05 NOTE — ED Notes (Signed)
Pt c/o right sided chest pain that has been going on all day. Pt states that her whole right side of her body feels tingly. Pt states that 5 mins PTA she felt like an elephant was sitting on her chest.

## 2013-10-05 NOTE — ED Notes (Signed)
Dr. Lacinda Axon notified of continued pain.

## 2013-10-08 ENCOUNTER — Telehealth: Payer: Self-pay | Admitting: Internal Medicine

## 2013-10-08 NOTE — Telephone Encounter (Signed)
Pt is aware. She will be out of town from 9/11-9/15. Told her that I would let SS know.

## 2013-10-08 NOTE — Telephone Encounter (Signed)
#  4 boxes of samples are at the front desk. Pt is aware. Pt assistance paperwork has been faxed again.

## 2013-10-08 NOTE — Telephone Encounter (Signed)
Pt LMOM that she needed more Dexilant samples. Please advise

## 2013-10-10 NOTE — Telephone Encounter (Signed)
Reminder in epic °

## 2013-10-16 ENCOUNTER — Ambulatory Visit (INDEPENDENT_AMBULATORY_CARE_PROVIDER_SITE_OTHER): Payer: Self-pay | Admitting: Orthopedic Surgery

## 2013-10-16 VITALS — BP 127/69 | Ht 68.0 in | Wt 218.0 lb

## 2013-10-16 DIAGNOSIS — S93409A Sprain of unspecified ligament of unspecified ankle, initial encounter: Secondary | ICD-10-CM

## 2013-10-16 NOTE — Patient Instructions (Signed)
Home therapy 

## 2013-10-16 NOTE — Progress Notes (Signed)
Followup visit    Chief Complaint  Patient presents with  . Follow-up    recheck right ankle, still hurting, DOI 05/04/13 sprain    BP 127/69  Ht 5\' 8"  (1.727 m)  Wt 218 lb (98.884 kg)  BMI 33.15 kg/m2  The patient injured her foot and ankle had no fracture basically did not move the foot or walk on the foot or ankle for the last 6 months. Comes in says she can't get her foot to the ground. She self-pay doesn't have any insurance or ability to have therapy. On exam shows a plantar flexed foot hypersensitivity to the bottom of the foot. She has good passive range of motion of holes the foot in plantar flexion. His pain can be dorsiflexed the foot. Scans intact motor exam is normal his sensation is hypersensitive rashes no lymphadenopathy she ambulates with crutches she is oriented x3 mood is normal vital signs are stable  System review is negative  Plantar flexion after trauma to right foot and ankle  Recommend home exercises which I showed her how to do along with her relative otherwise followup as needed

## 2013-10-19 ENCOUNTER — Emergency Department (HOSPITAL_COMMUNITY): Payer: Self-pay

## 2013-10-19 ENCOUNTER — Encounter (HOSPITAL_COMMUNITY): Payer: Self-pay | Admitting: Emergency Medicine

## 2013-10-19 ENCOUNTER — Emergency Department (HOSPITAL_COMMUNITY)
Admission: EM | Admit: 2013-10-19 | Discharge: 2013-10-20 | Disposition: A | Discharge status: Home / Self Care | Payer: Self-pay | Attending: Emergency Medicine | Admitting: Emergency Medicine

## 2013-10-19 DIAGNOSIS — Z79899 Other long term (current) drug therapy: Secondary | ICD-10-CM | POA: Insufficient documentation

## 2013-10-19 DIAGNOSIS — R011 Cardiac murmur, unspecified: Secondary | ICD-10-CM | POA: Insufficient documentation

## 2013-10-19 DIAGNOSIS — M545 Low back pain, unspecified: Secondary | ICD-10-CM

## 2013-10-19 DIAGNOSIS — Z860100 Personal history of colon polyps, unspecified: Secondary | ICD-10-CM | POA: Insufficient documentation

## 2013-10-19 DIAGNOSIS — Y9389 Activity, other specified: Secondary | ICD-10-CM | POA: Insufficient documentation

## 2013-10-19 DIAGNOSIS — IMO0002 Reserved for concepts with insufficient information to code with codable children: Secondary | ICD-10-CM | POA: Insufficient documentation

## 2013-10-19 DIAGNOSIS — Z880 Allergy status to penicillin: Secondary | ICD-10-CM | POA: Insufficient documentation

## 2013-10-19 DIAGNOSIS — R296 Repeated falls: Secondary | ICD-10-CM | POA: Insufficient documentation

## 2013-10-19 DIAGNOSIS — F17200 Nicotine dependence, unspecified, uncomplicated: Secondary | ICD-10-CM | POA: Insufficient documentation

## 2013-10-19 DIAGNOSIS — S0990XA Unspecified injury of head, initial encounter: Secondary | ICD-10-CM | POA: Insufficient documentation

## 2013-10-19 DIAGNOSIS — K219 Gastro-esophageal reflux disease without esophagitis: Secondary | ICD-10-CM | POA: Insufficient documentation

## 2013-10-19 DIAGNOSIS — R55 Syncope and collapse: Secondary | ICD-10-CM | POA: Insufficient documentation

## 2013-10-19 DIAGNOSIS — Z91040 Latex allergy status: Secondary | ICD-10-CM | POA: Insufficient documentation

## 2013-10-19 DIAGNOSIS — Y9289 Other specified places as the place of occurrence of the external cause: Secondary | ICD-10-CM | POA: Insufficient documentation

## 2013-10-19 LAB — BASIC METABOLIC PANEL
ANION GAP: 13 (ref 5–15)
BUN: 11 mg/dL (ref 6–23)
CHLORIDE: 102 meq/L (ref 96–112)
CO2: 22 mEq/L (ref 19–32)
CREATININE: 0.84 mg/dL (ref 0.50–1.10)
Calcium: 9 mg/dL (ref 8.4–10.5)
GFR calc non Af Amer: >90 mL/min (ref >90)
Glucose, Bld: 95 mg/dL (ref 70–99)
Potassium: 4.5 mEq/L (ref 3.7–5.3)
Sodium: 137 mEq/L (ref 137–147)

## 2013-10-19 LAB — URINALYSIS, ROUTINE W REFLEX MICROSCOPIC
BILIRUBIN URINE: NEGATIVE
Glucose, UA: NEGATIVE mg/dL
Hgb urine dipstick: NEGATIVE
Ketones, ur: NEGATIVE mg/dL
Leukocytes, UA: NEGATIVE
NITRITE: NEGATIVE
PROTEIN: NEGATIVE mg/dL
SPECIFIC GRAVITY, URINE: 1.01 (ref 1.005–1.030)
UROBILINOGEN UA: 0.2 mg/dL (ref 0.0–1.0)
pH: 6 (ref 5.0–8.0)

## 2013-10-19 MED ORDER — ONDANSETRON HCL 4 MG PO TABS
4.0000 mg | ORAL_TABLET | Freq: Once | ORAL | Status: AC
  Administered 2013-10-19: 4 mg via ORAL
  Filled 2013-10-19: qty 1

## 2013-10-19 MED ORDER — DIAZEPAM 5 MG PO TABS
5.0000 mg | ORAL_TABLET | Freq: Once | ORAL | Status: AC
  Administered 2013-10-19: 5 mg via ORAL
  Filled 2013-10-19: qty 1

## 2013-10-19 MED ORDER — MORPHINE SULFATE 4 MG/ML IJ SOLN
8.0000 mg | Freq: Once | INTRAMUSCULAR | Status: AC
  Administered 2013-10-19: 8 mg via INTRAMUSCULAR
  Filled 2013-10-19: qty 2

## 2013-10-19 NOTE — ED Notes (Signed)
PERRL

## 2013-10-19 NOTE — ED Notes (Signed)
Out in yard getting dogs and blacked out.  Mother-in-law found pt in yard.  C/o headache rates pain 10.  Vomiting up mucus green color.

## 2013-10-19 NOTE — ED Notes (Signed)
Patient transported to X-ray 

## 2013-10-20 MED ORDER — METHOCARBAMOL 500 MG PO TABS: ORAL_TABLET | ORAL | Status: DC

## 2013-10-20 MED ORDER — HYDROCODONE-ACETAMINOPHEN 5-325 MG PO TABS: ORAL_TABLET | ORAL | Status: DC

## 2013-10-20 NOTE — ED Provider Notes (Signed)
CSN: [id number]     Arrival date & time 10/19/13  2119 History   First MD Initiated Contact with Patient 10/19/13 2206     Chief Complaint  Patient presents with  . Fall  . Back Pain  . Loss of Consciousness     (Consider location/radiation/quality/duration/timing/severity/associated sxs/prior Treatment) HPI Comments: Patient is a 28 year old female who presents to the emergency department with a complaint of fall, and" I blacked out". Patient states that she was in a sitting position when she heard her dogs fighting. She got up to go outside to get her dogs. Once getting the dog she states that she must have passed out because she only remembers her mother-in-law finding her in the yard and calling her name and shaking her. The patient states that prior to this event she did not have any dizziness, blurred vision, shortness of breath, chest pain, sweats, nausea, vomiting, or known temperature elevations. The patient states she does not think that she got up to the mass. She had been eating her meals are pretty close to regular during the day. The patient states that upon awakening she had a severe headache, and she also vomited a mucous great-looking substance. She presents now for evaluation of these multiple problems. The patient also states that she has severe pain involving her lower back. She has a history of back pain issues, and the fall seems to have aggravated them even more.  Pt c/o head from the back of the head to the forehead.  Patient is a 28 y.o. female presenting with fall, back pain, and syncope. The history is provided by the patient.  Fall Associated symptoms include headaches. Pertinent negatives include no abdominal pain, arthralgias, chest pain, coughing or neck pain.  Back Pain Associated symptoms: headaches   Associated symptoms: no abdominal pain, no chest pain and no dysuria   Loss of Consciousness Associated symptoms: headaches   Associated symptoms: no chest pain,  no confusion, no dizziness, no palpitations, no seizures and no shortness of breath     Past Medical History  Diagnosis Date  . Right ankle injury   . Colon polyp   . Rectal polyp   . Carcinoid tumor of rectum   . 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 GERD (gastroesophageal reflux disease)    Past Surgical History  Procedure Laterality Date  . Colonoscopy with esophagogastroduodenoscopy (egd) N/A 05/16/2013    Dr. Gala Romney: rectal polyp with low grade carcinoid tumor, colon polyp with tubulovillous adenoma with high grade dysplasia. Needs repeat colonoscopy in 3 months. EGD with antral erosions, duodenal bulb erosions, superficial ulceration. path with mild chronic gastritis   . Colonoscopy with propofol N/A 09/06/2013    Procedure: COLONOSCOPY WITH PROPOFOL;  Surgeon: Daneil Dolin, MD;  Location: [facility];  Service: Endoscopy;  Laterality: N/A;  in cecum @ 1100; cecal withdrawal time - 12 minutes   Family History  Problem Relation Age of Onset  . Colon cancer Maternal Grandmother   . Cervical cancer Mother   . Asthma    . Diabetes     History  Substance Use Topics  . Smoking status: Current Every Day Smoker -- 1.50 packs/day for 11 years    Types: Cigarettes  . Smokeless tobacco: Not on file  . Alcohol Use: Yes     Comment: occasionally   OB History   Grav Para Term Preterm Abortions TAB SAB Ect Mult Living  Review of Systems  Constitutional: Negative for activity change.       All ROS Neg except as noted in HPI  HENT: Negative for nosebleeds.   Eyes: Negative for photophobia and discharge.  Respiratory: Negative for cough, shortness of breath and wheezing.   Cardiovascular: Positive for syncope. Negative for chest pain and palpitations.  Gastrointestinal: Negative for abdominal pain and blood in stool.  Genitourinary: Negative for dysuria, frequency and hematuria.  Musculoskeletal: Positive for back pain. Negative for arthralgias  and neck pain.  Skin: Negative.   Neurological: Positive for syncope and headaches. Negative for dizziness, seizures and speech difficulty.  Psychiatric/Behavioral: Negative for hallucinations and confusion.      Allergies  Codeine; Hydromorphone hcl; Nsaids; Penicillins; Bentyl; Latex; and Tape  Home Medications   Prior to Admission medications   Medication Sig Start Date End Date Taking? Authorizing Provider  dexlansoprazole (DEXILANT) 60 MG capsule Take 60 mg by mouth daily.   Yes Historical Provider, MD  HYDROcodone-acetaminophen (NORCO) 5-325 MG per tablet Take 2 tablets by mouth every 4 (four) hours as needed. 10/05/13   Nat Christen, MD   BP 118/80  Pulse 88  Temp(Src) 98.8 F (37.1 C) (Oral)  Resp 20  Ht 5\' 8"  (1.727 m)  Wt 218 lb (98.884 kg)  BMI 33.15 kg/m2  SpO2 98% Physical Exam  Nursing note and vitals reviewed. Constitutional: She is oriented to person, place, and time. She appears well-developed and well-nourished.  Non-toxic appearance.  HENT:  Head: Normocephalic.  Right Ear: Tympanic membrane and external ear normal.  Left Ear: Tympanic membrane and external ear normal.  Eyes: EOM and lids are normal. Pupils are equal, round, and reactive to light.  Neck: Normal range of motion. Neck supple. Carotid bruit is not present. No tracheal deviation present.  Cardiovascular: Regular rhythm, normal heart sounds, intact distal pulses and normal pulses.  Tachycardia present.   Pulmonary/Chest: Breath sounds normal. No respiratory distress. She has no wheezes. She has no rales.  Abdominal: Soft. Bowel sounds are normal. There is no tenderness. There is no guarding.  Musculoskeletal:       Lumbar back: She exhibits decreased range of motion, tenderness, pain and spasm.       Back:  Lymphadenopathy:       Head (right side): No submandibular adenopathy present.       Head (left side): No submandibular adenopathy present.    She has no cervical adenopathy.    Neurological: She is alert and oriented to person, place, and time. She has normal strength. No cranial nerve deficit or sensory deficit.  Skin: Skin is warm and dry.  Psychiatric: She has a normal mood and affect. Her speech is normal.    ED Course  Procedures (including critical care time) Labs Review Labs Reviewed  BASIC METABOLIC PANEL  URINALYSIS, ROUTINE W REFLEX MICROSCOPIC  CBG MONITORING, ED    Imaging Review Dg Lumbar Spine Complete  10/19/2013   CLINICAL DATA:  Lower back pain.  Status post fall.  EXAM: LUMBAR SPINE - COMPLETE 4+ VIEW  COMPARISON:  CT of the abdomen and pelvis from 05/20/2013  FINDINGS: There is no evidence of fracture or subluxation. Vertebral bodies demonstrate normal height and alignment. Intervertebral disc spaces are preserved. The visualized neural foramina are grossly unremarkable in appearance.  The visualized bowel gas pattern is unremarkable in appearance; air and stool are noted within the colon. Mild sclerotic change is noted at the sacroiliac joints.  IMPRESSION: No evidence of fracture or  subluxation along the lumbar spine.   Electronically Signed   By: Garald Balding M.D.   On: 10/19/2013 23:41   Dg Sacrum/coccyx  10/19/2013   CLINICAL DATA:  Lumbar and tail bone pain after syncopal episode and fall today  EXAM: SACRUM AND COCCYX - 2+ VIEW  COMPARISON:  None.  FINDINGS: There is no evidence of fracture or other focal bone lesions  IMPRESSION: Negative.   Electronically Signed   By: Skipper Cliche M.D.   On: 10/19/2013 23:41   Ct Head Wo Contrast  10/19/2013   CLINICAL DATA:  fall, LOC  EXAM: CT HEAD WITHOUT CONTRAST  CT CERVICAL SPINE WITHOUT CONTRAST  TECHNIQUE: Multidetector CT imaging of the head and cervical spine was performed following the standard protocol without intravenous contrast. Multiplanar CT image reconstructions of the cervical spine were also generated.  COMPARISON:  None.  FINDINGS: CT HEAD FINDINGS  There is no acute intracranial  hemorrhage or infarct. No mass lesion or midline shift. Gray-white matter differentiation is well maintained. Ventricles are normal in size without evidence of hydrocephalus. CSF containing spaces are within normal limits. No extra-axial fluid collection.  The calvarium is intact.  Orbital soft tissues are within normal limits.  The paranasal sinuses and mastoid air cells are well pneumatized and free of fluid.  Scalp soft tissues are unremarkable.  CT CERVICAL SPINE FINDINGS  The vertebral bodies are normally aligned with preservation of the normal cervical lordosis. Vertebral body heights are preserved. Normal C1-2 articulations are intact. No prevertebral soft tissue swelling. No acute fracture or listhesis.  Visualized soft tissues of the neck are within normal limits. Visualized lung apices are clear without evidence of apical pneumothorax.  IMPRESSION: CT HEAD:  No acute intracranial process.  CT CERVICAL SPINE:  No acute traumatic injury within the cervical spine.   Electronically Signed   By: Jeannine Boga M.D.   On: 10/19/2013 23:44   Ct Cervical Spine Wo Contrast  10/19/2013   CLINICAL DATA:  fall, LOC  EXAM: CT HEAD WITHOUT CONTRAST  CT CERVICAL SPINE WITHOUT CONTRAST  TECHNIQUE: Multidetector CT imaging of the head and cervical spine was performed following the standard protocol without intravenous contrast. Multiplanar CT image reconstructions of the cervical spine were also generated.  COMPARISON:  None.  FINDINGS: CT HEAD FINDINGS  There is no acute intracranial hemorrhage or infarct. No mass lesion or midline shift. Gray-white matter differentiation is well maintained. Ventricles are normal in size without evidence of hydrocephalus. CSF containing spaces are within normal limits. No extra-axial fluid collection.  The calvarium is intact.  Orbital soft tissues are within normal limits.  The paranasal sinuses and mastoid air cells are well pneumatized and free of fluid.  Scalp soft tissues are  unremarkable.  CT CERVICAL SPINE FINDINGS  The vertebral bodies are normally aligned with preservation of the normal cervical lordosis. Vertebral body heights are preserved. Normal C1-2 articulations are intact. No prevertebral soft tissue swelling. No acute fracture or listhesis.  Visualized soft tissues of the neck are within normal limits. Visualized lung apices are clear without evidence of apical pneumothorax.  IMPRESSION: CT HEAD:  No acute intracranial process.  CT CERVICAL SPINE:  No acute traumatic injury within the cervical spine.   Electronically Signed   By: Jeannine Boga M.D.   On: 10/19/2013 23:44     EKG Interpretation None      MDM Patient was treated in the emergency department with intramuscular morphine, oral Valium, and Zofran.  Vital signs  are well within normal limits with exception of the pulse rate being elevated at 114. The pulse oximetry is 99% on room air. Within normal limits by my interpretation.  CT scan of the cervical spine revealed no acute traumatic injury. CT scan of the head revealed no acute intracranial process. X-ray of the coccyx is negative for fracture or dislocation. X-ray of the lumbar spine is negative for fracture or subluxation. Urine analysis was negative. Basic metabolic panel was also well within normal limits. Suspect the patient probably had a vasovagal reaction followed by a short episode of syncope. Also suspect that the patient aggravated the previous lower lumbar back issues.  I have reassured the patient of her vital signs, as well as her labs. I reviewed the CT scans and the x-rays with the patient in terms which he understands. The patient is receiving some improvement in her pain after the medications given here in the emergency department. The patient ambulated to the bathroom without problem.  Patient is advised to see her primary physician next week for additional followup and management. Prescription for Norco one or 2 tablets  every 4 hours, and Robaxin 2 tablets 3 times daily has been given to the patient.    Final diagnoses:  None    *I have reviewed nursing notes, vital signs, and all appropriate lab and imaging results for this patient.Lenox Ahr, PA-C 10/20/13 1622

## 2013-10-20 NOTE — ED Provider Notes (Signed)
Medical screening examination/treatment/procedure(s) were performed by non-physician practitioner and as supervising physician I was immediately available for consultation/collaboration.  Richarda Blade, MD 10/20/13 [phone]

## 2013-10-20 NOTE — Discharge Instructions (Signed)
The CT scan of your head and neck, and the x-rays of your lower back and coccyx area are negative for acute fracture or dislocation. Your examination suggest muscle strain and contusion. Please rest your back is much as possible. Please apply ice tonight and tomorrow. Then start heat to this area. Please use Robaxin 3 times daily for spasm, use Norco every 4 hours if needed for pain. Use Tylenol for mild pain. Please see your primary physician, or return to the emergency department if not improving.

## 2013-10-23 ENCOUNTER — Encounter (HOSPITAL_COMMUNITY): Payer: Self-pay | Admitting: Emergency Medicine

## 2013-10-23 ENCOUNTER — Emergency Department (HOSPITAL_COMMUNITY)
Admission: EM | Admit: 2013-10-23 | Discharge: 2013-10-23 | Disposition: A | Discharge status: Home / Self Care | Payer: Self-pay | Attending: Emergency Medicine | Admitting: Emergency Medicine

## 2013-10-23 DIAGNOSIS — Z880 Allergy status to penicillin: Secondary | ICD-10-CM | POA: Insufficient documentation

## 2013-10-23 DIAGNOSIS — Z860100 Personal history of colon polyps, unspecified: Secondary | ICD-10-CM | POA: Insufficient documentation

## 2013-10-23 DIAGNOSIS — Z85048 Personal history of other malignant neoplasm of rectum, rectosigmoid junction, and anus: Secondary | ICD-10-CM | POA: Insufficient documentation

## 2013-10-23 DIAGNOSIS — K219 Gastro-esophageal reflux disease without esophagitis: Secondary | ICD-10-CM | POA: Insufficient documentation

## 2013-10-23 DIAGNOSIS — F419 Anxiety disorder, unspecified: Secondary | ICD-10-CM

## 2013-10-23 DIAGNOSIS — Z91040 Latex allergy status: Secondary | ICD-10-CM | POA: Insufficient documentation

## 2013-10-23 DIAGNOSIS — F17200 Nicotine dependence, unspecified, uncomplicated: Secondary | ICD-10-CM | POA: Insufficient documentation

## 2013-10-23 DIAGNOSIS — R259 Unspecified abnormal involuntary movements: Secondary | ICD-10-CM | POA: Insufficient documentation

## 2013-10-23 DIAGNOSIS — Z87828 Personal history of other (healed) physical injury and trauma: Secondary | ICD-10-CM | POA: Insufficient documentation

## 2013-10-23 DIAGNOSIS — F411 Generalized anxiety disorder: Secondary | ICD-10-CM | POA: Insufficient documentation

## 2013-10-23 DIAGNOSIS — Z79899 Other long term (current) drug therapy: Secondary | ICD-10-CM | POA: Insufficient documentation

## 2013-10-23 MED ORDER — LORAZEPAM 1 MG PO TABS
1.0000 mg | ORAL_TABLET | Freq: Once | ORAL | Status: AC
  Administered 2013-10-23: 1 mg via ORAL
  Filled 2013-10-23: qty 1

## 2013-10-23 MED ORDER — LORAZEPAM 1 MG PO TABS: 1.0000 mg | ORAL_TABLET | Freq: Three times a day (TID) | ORAL | Status: DC | PRN

## 2013-10-23 NOTE — ED Notes (Signed)
Pt reports was inside giving her dogs a bath and suddenly became dizzy, shakey, and breathing "hard."  Pt reports fell last Friday and has a concussion.

## 2013-10-23 NOTE — ED Notes (Signed)
Patient verbalizes understanding of discharge instructions, prescription medications, and follow up care. Patient ambulatory out of department at this time with family. 

## 2013-10-23 NOTE — Discharge Instructions (Signed)
I reviewed all of your recent tests. No obvious abnormalities were noted. Medication for restlessness or anxiety. Followup your primary care Dr.

## 2013-10-23 NOTE — ED Provider Notes (Signed)
CSN: [id number]     Arrival date & time 10/23/13  1617 History   First MD Initiated Contact with Patient 10/23/13 1736     Chief Complaint  Patient presents with  . Sherene Sires      (Consider location/radiation/quality/duration/timing/severity/associated sxs/prior Treatment) HPI... shakiness, clamminess approximately one hour prior to visit. She has only popcorn for lunch. No chest pain, dyspnea, fever, chills, dysuria, syncope. She feels better now. Nothing makes symptoms better or worse. Severity is mild to moderate  Past Medical History  Diagnosis Date  . Right ankle injury   . Colon polyp   . Rectal polyp   . Carcinoid tumor of rectum   . 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 GERD (gastroesophageal reflux disease)    Past Surgical History  Procedure Laterality Date  . Colonoscopy with esophagogastroduodenoscopy (egd) N/A 05/16/2013    Dr. Gala Romney: rectal polyp with low grade carcinoid tumor, colon polyp with tubulovillous adenoma with high grade dysplasia. Needs repeat colonoscopy in 3 months. EGD with antral erosions, duodenal bulb erosions, superficial ulceration. path with mild chronic gastritis   . Colonoscopy with propofol N/A 09/06/2013    Procedure: COLONOSCOPY WITH PROPOFOL;  Surgeon: Daneil Dolin, MD;  Location: [facility];  Service: Endoscopy;  Laterality: N/A;  in cecum @ 1100; cecal withdrawal time - 12 minutes   Family History  Problem Relation Age of Onset  . Colon cancer Maternal Grandmother   . Cervical cancer Mother   . Asthma    . Diabetes     History  Substance Use Topics  . Smoking status: Current Every Day Smoker -- 1.50 packs/day for 11 years    Types: Cigarettes  . Smokeless tobacco: Not on file  . Alcohol Use: Yes     Comment: occasionally   OB History   Grav Para Term Preterm Abortions TAB SAB Ect Mult Living                 Review of Systems  All other systems reviewed and are negative.     Allergies  Codeine;  Hydromorphone hcl; Nsaids; Penicillins; Bentyl; Latex; and Tape  Home Medications   Prior to Admission medications   Medication Sig Start Date End Date Taking? Authorizing Provider  aspirin-acetaminophen-caffeine (EXCEDRIN MIGRAINE) [phone] MG per tablet Take 2 tablets by mouth once as needed for headache.   Yes Historical Provider, MD  dexlansoprazole (DEXILANT) 60 MG capsule Take 60 mg by mouth daily.   Yes Historical Provider, MD  HYDROcodone-acetaminophen (NORCO/VICODIN) 5-325 MG per tablet 1 or 2 po q4h prn pain 10/20/13   Lenox Ahr, PA-C  LORazepam (ATIVAN) 1 MG tablet Take 1 tablet (1 mg total) by mouth 3 (three) times daily as needed for anxiety. 10/23/13   Nat Christen, MD  methocarbamol (ROBAXIN) 500 MG tablet 2 po tid 10/20/13   Lenox Ahr, PA-C   BP 117/75  Pulse 72  Temp(Src) 97.3 F (36.3 C) (Oral)  Resp 20  Ht 5\' 8"  (1.727 m)  Wt 218 lb (98.884 kg)  BMI 33.15 kg/m2  SpO2 100% Physical Exam  Nursing note and vitals reviewed. Constitutional: She is oriented to person, place, and time. She appears well-developed and well-nourished.  HENT:  Head: Normocephalic and atraumatic.  Eyes: Conjunctivae and EOM are normal. Pupils are equal, round, and reactive to light.  Neck: Normal range of motion. Neck supple.  Cardiovascular: Normal rate, regular rhythm and normal heart sounds.   Pulmonary/Chest: Effort  normal and breath sounds normal.  Abdominal: Soft. Bowel sounds are normal.  Musculoskeletal: Normal range of motion.  Neurological: She is alert and oriented to person, place, and time.  Skin: Skin is warm and dry.  Psychiatric: She has a normal mood and affect. Her behavior is normal.    ED Course  Procedures (including critical care time) Labs Review Labs Reviewed - No data to display  Imaging Review No results found.   EKG Interpretation None      Date: 10/23/2013  Rate: 74 Rhythm: normal sinus rhythm  QRS Axis: normal  Intervals: normal  ST/T  Wave abnormalities: normal  Conduction Disutrbances: none  Narrative Interpretation: unremarkable    MDM   Final diagnoses:  Anxiety    Patient has a totally normal physical exam. Vital signs are normal. No neurological deficits. Rx Ativan 1 mg    Nat Christen, MD 10/23/13 2109

## 2013-10-29 ENCOUNTER — Telehealth: Payer: Self-pay

## 2013-10-29 NOTE — Telephone Encounter (Signed)
If no evidence of obstruction such as N/V, inability to tolerate liquids, severe abdominal pain, let's trial a Miralax purge for quick results.   Miralax 1 capful each hour with 8 ounces of water. Repeat up to 6 doses.  To ED if significant pain.

## 2013-10-29 NOTE — Telephone Encounter (Signed)
Pt is unable to have a bowel. Her last bowl movement was a week ago. Her bottom is in fire.She has tried the cream for her bottom but it is not helping.  She tried a fleet enema Sunday with no results. Her stomach and her bottom is hurting so bad. Her stomach is tight. Pain level is at a 10. Please advise. She uses [facility] . Her call back number [phone]

## 2013-10-29 NOTE — Telephone Encounter (Signed)
Pt does not have any N/V she is able to eat and drink. She is aware to do the Miralax purge and if she has significant pain to go to the ER.

## 2013-10-30 ENCOUNTER — Other Ambulatory Visit: Payer: Self-pay

## 2013-10-30 ENCOUNTER — Ambulatory Visit (HOSPITAL_COMMUNITY)
Admission: RE | Admit: 2013-10-30 | Discharge: 2013-10-30 | Disposition: A | Discharge status: Home / Self Care | Payer: Self-pay | Source: Ambulatory Visit | Attending: Gastroenterology | Admitting: Gastroenterology

## 2013-10-30 DIAGNOSIS — R1901 Right upper quadrant abdominal swelling, mass and lump: Secondary | ICD-10-CM | POA: Insufficient documentation

## 2013-10-30 DIAGNOSIS — R1011 Right upper quadrant pain: Secondary | ICD-10-CM | POA: Insufficient documentation

## 2013-10-30 NOTE — Telephone Encounter (Signed)
Patient called back asking about x-ray results, she is stating she has a lot of pressure shes used 1 fleet enema and miralax with no relief at all, c/o abdominal pain, please advise

## 2013-10-30 NOTE — Telephone Encounter (Signed)
Order in and Pt aware to go have it done.

## 2013-10-30 NOTE — Telephone Encounter (Signed)
Reviewed xray with Dr. Thornton Papas. No evidence for large stool burden.  If she is having this severe pain may need to go to the ED. Limited options over the phone.

## 2013-10-30 NOTE — Telephone Encounter (Signed)
Pt called this morning she did the Miralax with no results. She did a fleets enema this morning with little results. She stomach is tight and swollowing. Under right breast area the is a knot. That she seen this morning.Please advise

## 2013-10-30 NOTE — Telephone Encounter (Signed)
Let's get a flat plate abdominal xray one view.

## 2013-10-30 NOTE — Telephone Encounter (Signed)
Noted  

## 2013-10-31 NOTE — Telephone Encounter (Signed)
Pt is aware of results. Advised her to go to the ER if she is still having sever pain.

## 2013-11-03 ENCOUNTER — Emergency Department (HOSPITAL_COMMUNITY)
Admission: EM | Admit: 2013-11-03 | Discharge: 2013-11-03 | Discharge status: Against Medical Advice | Payer: MEDICAID | Attending: Emergency Medicine | Admitting: Emergency Medicine

## 2013-11-03 ENCOUNTER — Encounter (HOSPITAL_COMMUNITY): Payer: Self-pay | Admitting: Emergency Medicine

## 2013-11-03 DIAGNOSIS — F17200 Nicotine dependence, unspecified, uncomplicated: Secondary | ICD-10-CM | POA: Insufficient documentation

## 2013-11-03 DIAGNOSIS — R109 Unspecified abdominal pain: Secondary | ICD-10-CM | POA: Insufficient documentation

## 2013-11-03 NOTE — ED Notes (Signed)
Pt came to window to asking how much longer is she going to have to wait. Pt advised no way to be able to tell. Pt states she is leaving & going to [place]. Pt left in no acute distress.

## 2013-11-03 NOTE — ED Notes (Signed)
Patient states that she is having lower abdominal pain and has been vomiting today.

## 2013-11-05 ENCOUNTER — Encounter (HOSPITAL_COMMUNITY): Payer: Self-pay | Admitting: Emergency Medicine

## 2013-11-05 ENCOUNTER — Emergency Department (HOSPITAL_COMMUNITY)
Admission: EM | Admit: 2013-11-05 | Discharge: 2013-11-06 | Disposition: A | Discharge status: Home / Self Care | Payer: Self-pay | Attending: Emergency Medicine | Admitting: Emergency Medicine

## 2013-11-05 ENCOUNTER — Emergency Department (HOSPITAL_COMMUNITY): Payer: Self-pay

## 2013-11-05 ENCOUNTER — Other Ambulatory Visit: Payer: Self-pay | Admitting: Gastroenterology

## 2013-11-05 DIAGNOSIS — Z859 Personal history of malignant neoplasm, unspecified: Secondary | ICD-10-CM | POA: Insufficient documentation

## 2013-11-05 DIAGNOSIS — Z860100 Personal history of colon polyps, unspecified: Secondary | ICD-10-CM | POA: Insufficient documentation

## 2013-11-05 DIAGNOSIS — R51 Headache: Secondary | ICD-10-CM | POA: Insufficient documentation

## 2013-11-05 DIAGNOSIS — Z79899 Other long term (current) drug therapy: Secondary | ICD-10-CM | POA: Insufficient documentation

## 2013-11-05 DIAGNOSIS — N898 Other specified noninflammatory disorders of vagina: Secondary | ICD-10-CM | POA: Insufficient documentation

## 2013-11-05 DIAGNOSIS — R197 Diarrhea, unspecified: Secondary | ICD-10-CM | POA: Insufficient documentation

## 2013-11-05 DIAGNOSIS — R300 Dysuria: Secondary | ICD-10-CM | POA: Insufficient documentation

## 2013-11-05 DIAGNOSIS — R1031 Right lower quadrant pain: Secondary | ICD-10-CM | POA: Insufficient documentation

## 2013-11-05 DIAGNOSIS — Z9889 Other specified postprocedural states: Secondary | ICD-10-CM | POA: Insufficient documentation

## 2013-11-05 DIAGNOSIS — R112 Nausea with vomiting, unspecified: Secondary | ICD-10-CM | POA: Insufficient documentation

## 2013-11-05 DIAGNOSIS — R109 Unspecified abdominal pain: Secondary | ICD-10-CM

## 2013-11-05 DIAGNOSIS — Z880 Allergy status to penicillin: Secondary | ICD-10-CM | POA: Insufficient documentation

## 2013-11-05 DIAGNOSIS — F17200 Nicotine dependence, unspecified, uncomplicated: Secondary | ICD-10-CM | POA: Insufficient documentation

## 2013-11-05 DIAGNOSIS — R1032 Left lower quadrant pain: Secondary | ICD-10-CM | POA: Insufficient documentation

## 2013-11-05 DIAGNOSIS — Z3202 Encounter for pregnancy test, result negative: Secondary | ICD-10-CM | POA: Insufficient documentation

## 2013-11-05 DIAGNOSIS — K219 Gastro-esophageal reflux disease without esophagitis: Secondary | ICD-10-CM | POA: Insufficient documentation

## 2013-11-05 DIAGNOSIS — H539 Unspecified visual disturbance: Secondary | ICD-10-CM | POA: Insufficient documentation

## 2013-11-05 DIAGNOSIS — Z91040 Latex allergy status: Secondary | ICD-10-CM | POA: Insufficient documentation

## 2013-11-05 LAB — COMPREHENSIVE METABOLIC PANEL
ALBUMIN: 4 g/dL (ref 3.5–5.2)
ALK PHOS: 97 U/L (ref 39–117)
ALT: 12 U/L (ref 0–35)
AST: 15 U/L (ref 0–37)
Anion gap: 13 (ref 5–15)
BUN: 15 mg/dL (ref 6–23)
CALCIUM: 9.6 mg/dL (ref 8.4–10.5)
CO2: 23 mEq/L (ref 19–32)
Chloride: 103 mEq/L (ref 96–112)
Creatinine, Ser: 0.79 mg/dL (ref 0.50–1.10)
GFR calc Af Amer: >90 mL/min (ref >90)
GFR calc non Af Amer: >90 mL/min (ref >90)
Glucose, Bld: 100 mg/dL — ABNORMAL HIGH (ref 70–99)
POTASSIUM: 4.4 meq/L (ref 3.7–5.3)
SODIUM: 139 meq/L (ref 137–147)
Total Bilirubin: 0.6 mg/dL (ref 0.3–1.2)
Total Protein: 7.3 g/dL (ref 6.0–8.3)

## 2013-11-05 LAB — URINALYSIS, ROUTINE W REFLEX MICROSCOPIC
Glucose, UA: NEGATIVE mg/dL
HGB URINE DIPSTICK: NEGATIVE
Leukocytes, UA: NEGATIVE
Nitrite: NEGATIVE
PH: 5.5 (ref 5.0–8.0)
Protein, ur: NEGATIVE mg/dL
Specific Gravity, Urine: >1.03 — ABNORMAL HIGH (ref 1.005–1.030)
Urobilinogen, UA: 0.2 mg/dL (ref 0.0–1.0)

## 2013-11-05 LAB — CBC WITH DIFFERENTIAL/PLATELET
BASOS ABS: 0 10*3/uL (ref 0.0–0.1)
BASOS PCT: 0 % (ref 0–1)
EOS ABS: 0.4 10*3/uL (ref 0.0–0.7)
Eosinophils Relative: 4 % (ref 0–5)
HCT: 42.5 % (ref 36.0–46.0)
Hemoglobin: 14.9 g/dL (ref 12.0–15.0)
LYMPHS ABS: 3 10*3/uL (ref 0.7–4.0)
Lymphocytes Relative: 31 % (ref 12–46)
MCH: 31.3 pg (ref 26.0–34.0)
MCHC: 35.1 g/dL (ref 30.0–36.0)
MCV: 89.3 fL (ref 78.0–100.0)
Monocytes Absolute: 0.6 10*3/uL (ref 0.1–1.0)
Monocytes Relative: 6 % (ref 3–12)
NEUTROS PCT: 59 % (ref 43–77)
Neutro Abs: 5.6 10*3/uL (ref 1.7–7.7)
PLATELETS: 172 10*3/uL (ref 150–400)
RBC: 4.76 MIL/uL (ref 3.87–5.11)
RDW: 12.2 % (ref 11.5–15.5)
WBC: 9.7 10*3/uL (ref 4.0–10.5)

## 2013-11-05 LAB — LIPASE, BLOOD: LIPASE: 35 U/L (ref 11–59)

## 2013-11-05 LAB — PREGNANCY, URINE: PREG TEST UR: NEGATIVE

## 2013-11-05 MED ORDER — MORPHINE SULFATE 2 MG/ML IJ SOLN
2.0000 mg | Freq: Once | INTRAMUSCULAR | Status: AC
  Administered 2013-11-05: 2 mg via INTRAVENOUS
  Filled 2013-11-05: qty 1

## 2013-11-05 MED ORDER — SODIUM CHLORIDE 0.9 % IV SOLN
INTRAVENOUS | Status: DC
  Administered 2013-11-05: 21:00:00 via INTRAVENOUS

## 2013-11-05 MED ORDER — SODIUM CHLORIDE 0.9 % IV BOLUS (SEPSIS)
1000.0000 mL | Freq: Once | INTRAVENOUS | Status: AC
  Administered 2013-11-05: 1000 mL via INTRAVENOUS

## 2013-11-05 MED ORDER — HYDROCODONE-ACETAMINOPHEN 5-325 MG PO TABS: 1.0000 | ORAL_TABLET | Freq: Four times a day (QID) | ORAL | Status: DC | PRN

## 2013-11-05 MED ORDER — PROMETHAZINE HCL 25 MG/ML IJ SOLN
12.5000 mg | Freq: Once | INTRAMUSCULAR | Status: AC
  Administered 2013-11-05: 12.5 mg via INTRAVENOUS
  Filled 2013-11-05: qty 1

## 2013-11-05 MED ORDER — PROMETHAZINE HCL 25 MG PO TABS: 25.0000 mg | ORAL_TABLET | Freq: Four times a day (QID) | ORAL | Status: DC | PRN

## 2013-11-05 MED ORDER — IOHEXOL 300 MG/ML  SOLN
50.0000 mL | Freq: Once | INTRAMUSCULAR | Status: AC | PRN
  Administered 2013-11-05: 50 mL via ORAL

## 2013-11-05 MED ORDER — IOHEXOL 300 MG/ML  SOLN
100.0000 mL | Freq: Once | INTRAMUSCULAR | Status: AC | PRN
  Administered 2013-11-05: 100 mL via INTRAVENOUS

## 2013-11-05 NOTE — Telephone Encounter (Signed)
Surgical consult has been made to [facility] in [place] they take [place] assistance

## 2013-11-05 NOTE — Discharge Instructions (Signed)
Abdominal Pain, Women °Abdominal (stomach, pelvic, or belly) pain can be caused by many things. It is important to tell your doctor: °· The location of the pain. °· Does it come and go or is it present all the time? °· Are there things that start the pain (eating certain foods, exercise)? °· Are there other symptoms associated with the pain (fever, nausea, vomiting, diarrhea)? °All of this is helpful to know when trying to find the cause of the pain. °CAUSES  °· Stomach: virus or bacteria infection, or ulcer. °· Intestine: appendicitis (inflamed appendix), regional ileitis (Crohn's disease), ulcerative colitis (inflamed colon), irritable bowel syndrome, diverticulitis (inflamed diverticulum of the colon), or cancer of the stomach or intestine. °· Gallbladder disease or stones in the gallbladder. °· Kidney disease, kidney stones, or infection. °· Pancreas infection or cancer. °· Fibromyalgia (pain disorder). °· Diseases of the female organs: °¨ Uterus: fibroid (non-cancerous) tumors or infection. °¨ Fallopian tubes: infection or tubal pregnancy. °¨ Ovary: cysts or tumors. °¨ Pelvic adhesions (scar tissue). °¨ Endometriosis (uterus lining tissue growing in the pelvis and on the pelvic organs). °¨ Pelvic congestion syndrome (female organs filling up with blood just before the menstrual period). °¨ Pain with the menstrual period. °¨ Pain with ovulation (producing an egg). °¨ Pain with an IUD (intrauterine device, birth control) in the uterus. °¨ Cancer of the female organs. °· Functional pain (pain not caused by a disease, may improve without treatment). °· Psychological pain. °· Depression. °DIAGNOSIS  °Your doctor will decide the seriousness of your pain by doing an examination. °· Blood tests. °· X-rays. °· Ultrasound. °· CT scan (computed tomography, special type of X-ray). °· MRI (magnetic resonance imaging). °· Cultures, for infection. °· Barium enema (dye inserted in the large intestine, to better view it with  X-rays). °· Colonoscopy (looking in intestine with a lighted tube). °· Laparoscopy (minor surgery, looking in abdomen with a lighted tube). °· Major abdominal exploratory surgery (looking in abdomen with a large incision). °TREATMENT  °The treatment will depend on the cause of the pain.  °· Many cases can be observed and treated at home. °· Over-the-counter medicines recommended by your caregiver. °· Prescription medicine. °· Antibiotics, for infection. °· Birth control pills, for painful periods or for ovulation pain. °· Hormone treatment, for endometriosis. °· Nerve blocking injections. °· Physical therapy. °· Antidepressants. °· Counseling with a psychologist or psychiatrist. °· Minor or major surgery. °HOME CARE INSTRUCTIONS  °· Do not take laxatives, unless directed by your caregiver. °· Take over-the-counter pain medicine only if ordered by your caregiver. Do not take aspirin because it can cause an upset stomach or bleeding. °· Try a clear liquid diet (broth or water) as ordered by your caregiver. Slowly move to a bland diet, as tolerated, if the pain is related to the stomach or intestine. °· Have a thermometer and take your temperature several times a day, and record it. °· Bed rest and sleep, if it helps the pain. °· Avoid sexual intercourse, if it causes pain. °· Avoid stressful situations. °· Keep your follow-up appointments and tests, as your caregiver orders. °· If the pain does not go away with medicine or surgery, you may try: °¨ Acupuncture. °¨ Relaxation exercises (yoga, meditation). °¨ Group therapy. °¨ Counseling. °SEEK MEDICAL CARE IF:  °· You notice certain foods cause stomach pain. °· Your home care treatment is not helping your pain. °· You need stronger pain medicine. °· You want your IUD removed. °· You feel faint or   lightheaded.  You develop nausea and vomiting.  You develop a rash.  You are having side effects or an allergy to your medicine. SEEK IMMEDIATE MEDICAL CARE IF:   Your  pain does not go away or gets worse.  You have a fever.  Your pain is felt only in portions of the abdomen. The right side could possibly be appendicitis. The left lower portion of the abdomen could be colitis or diverticulitis.  You are passing blood in your stools (bright red or black tarry stools, with or without vomiting).  You have blood in your urine.  You develop chills, with or without a fever.  You pass out. MAKE SURE YOU:   Understand these instructions.  Will watch your condition.  Will get help right away if you are not doing well or get worse. Document Released: 12/27/2006 Document Revised: 07/16/2013 Document Reviewed: 01/16/2009 [facility] Patient Information 2015 [place], [place]. This information is not intended to replace advice given to you by your health care provider. Make sure you discuss any questions you have with your health care provider.  Workup today without any significant findings. Followup with GYN for the chronic abdominal pain pelvic pain. Take medications as directed. Return for new or worse symptoms.

## 2013-11-05 NOTE — ED Notes (Signed)
Pt with abd pain for 2 days with N/V and diarrhea

## 2013-11-05 NOTE — ED Provider Notes (Signed)
CSN: [id number]     Arrival date & time 11/05/13  1716 History  This chart was scribed for Fredia Sorrow, MD, by Neta Ehlers, ED Scribe. This patient was seen in room [facility] and the patient's care was started at 7:52 PM.   First MD Initiated Contact with Patient 11/05/13 1940     Chief Complaint  Patient presents with  . Abdominal Pain  . Emesis    Patient is a 28 y.o. female presenting with abdominal pain. The history is provided by the patient. No language interpreter was used.  Abdominal Pain Pain location:  Suprapubic Pain quality: sharp and stabbing   Pain radiates to:  Does not radiate Pain severity:  Severe Duration:  2 days Timing:  Unable to specify Progression:  Unchanged Chronicity:  Chronic Relieved by:  Nothing Worsened by:  Nothing tried Ineffective treatments:  None tried Associated symptoms: diarrhea, nausea, vaginal discharge and vomiting   Associated symptoms: no chest pain, no chills, no cough, no fever, no shortness of breath, no sore throat and no vaginal bleeding   Risk factors: obesity     HPI Comments: Patricia Rivers is a 28 y.o. female who presents to the Emergency Department complaining of two days of sharp suprapubic pain which she characterizes as "stabbing" and which she rates as 10/10. She endorses today she has also experienced nausea, emesis, and diarrhea; she has had one episode of diarrhea today and four episodes of emesis. Today she also developed urgency and difficulty urinating; she last voided 12 hours ago.  Additionally she endorses suprapubic and vaginal swelling. She also reports baseline vaginal discharge which she attributes to receiving Depo-Provera shots 13 years ago. She denies fever, chills, or back pain. The pt reports last year her PCP in [place], Dr. Carlis Abbott, informed her she had ovarian cysts. She denies a h/o UTI. The pt does not menstruate. [name] is a current smoker.   Past Medical History  Diagnosis Date  . Right  ankle injury   . Colon polyp   . Rectal polyp   . Carcinoid tumor of rectum   . 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 GERD (gastroesophageal reflux disease)    Past Surgical History  Procedure Laterality Date  . Colonoscopy with esophagogastroduodenoscopy (egd) N/A 05/16/2013    Dr. Gala Romney: rectal polyp with low grade carcinoid tumor, colon polyp with tubulovillous adenoma with high grade dysplasia. Needs repeat colonoscopy in 3 months. EGD with antral erosions, duodenal bulb erosions, superficial ulceration. path with mild chronic gastritis   . Colonoscopy with propofol N/A 09/06/2013    Procedure: COLONOSCOPY WITH PROPOFOL;  Surgeon: Daneil Dolin, MD;  Location: [facility];  Service: Endoscopy;  Laterality: N/A;  in cecum @ 1100; cecal withdrawal time - 12 minutes   Family History  Problem Relation Age of Onset  . Colon cancer Maternal Grandmother   . Cervical cancer Mother   . Asthma    . Diabetes     History  Substance Use Topics  . Smoking status: Current Every Day Smoker -- 1.50 packs/day for 11 years    Types: Cigarettes  . Smokeless tobacco: Not on file  . Alcohol Use: Yes     Comment: occasionally   No OB history provided.  Review of Systems  Constitutional: Negative for fever and chills.  HENT: Negative for congestion, rhinorrhea and sore throat.   Eyes: Positive for visual disturbance.  Respiratory: Negative for cough and shortness of breath.  Cardiovascular: Negative for chest pain and leg swelling.  Gastrointestinal: Positive for nausea, vomiting, abdominal pain and diarrhea.  Genitourinary: Positive for urgency, vaginal discharge and difficulty urinating. Negative for vaginal bleeding.  Musculoskeletal: Negative for back pain.  Skin: Negative for rash.  Neurological: Positive for headaches.  Hematological: Does not bruise/bleed easily.    Allergies  Codeine; Hydromorphone hcl; Nsaids; Penicillins; Bentyl; Latex; and Tape  Home  Medications   Prior to Admission medications   Medication Sig Start Date End Date Taking? Authorizing Provider  dexlansoprazole (DEXILANT) 60 MG capsule Take 60 mg by mouth daily.   Yes Historical Provider, MD  HYDROcodone-acetaminophen (NORCO/VICODIN) 5-325 MG per tablet 1 or 2 po q4h prn pain 10/20/13   Lenox Ahr, PA-C  HYDROcodone-acetaminophen (NORCO/VICODIN) 5-325 MG per tablet Take 1-2 tablets by mouth every 6 (six) hours as needed. 11/05/13   Fredia Sorrow, MD  LORazepam (ATIVAN) 1 MG tablet Take 1 tablet (1 mg total) by mouth 3 (three) times daily as needed for anxiety. 10/23/13   Nat Christen, MD  methocarbamol (ROBAXIN) 500 MG tablet 2 po tid 10/20/13   Lenox Ahr, PA-C  promethazine (PHENERGAN) 25 MG tablet Take 1 tablet (25 mg total) by mouth every 6 (six) hours as needed. 11/05/13   Fredia Sorrow, MD   Triage Vitals: BP 113/77  Pulse 88  Temp(Src) 98.4 F (36.9 C) (Oral)  Resp 18  Ht 5\' 8"  (1.727 m)  Wt 218 lb (98.884 kg)  BMI 33.15 kg/m2  SpO2 100%  Physical Exam  Nursing note and vitals reviewed. Constitutional: She is oriented to person, place, and time. She appears well-developed and well-nourished. No distress.  HENT:  Head: Normocephalic and atraumatic.  Eyes: Conjunctivae and EOM are normal.  Neck: Neck supple. No tracheal deviation present.  Cardiovascular: Normal rate, regular rhythm and normal heart sounds.   No murmur heard. Pulmonary/Chest: Effort normal and breath sounds normal. No respiratory distress. She has no wheezes. She has no rales.  Abdominal: Bowel sounds are normal. There is tenderness.  Some tenderness to lower part of abdomen: left, right, and middle.    Musculoskeletal: Normal range of motion. She exhibits no edema.  Neurological: She is alert and oriented to person, place, and time. No cranial nerve deficit. Coordination normal.  Skin: Skin is warm and dry.  Psychiatric: She has a normal mood and affect. Her behavior is normal.     ED Course  Procedures (including critical care time)  DIAGNOSTIC STUDIES: Oxygen Saturation is 100% on room air, normal by my interpretation.    COORDINATION OF CARE:  8:03 PM- Discussed treatment plan with patient, and the patient agreed to the plan. The plan includes medication, a CT scan, and lab work.   Results for orders placed during the hospital encounter of 11/05/13  CBC WITH DIFFERENTIAL      Result Value Ref Range   WBC 9.7  4.0 - 10.5 K/uL   RBC 4.76  3.87 - 5.11 MIL/uL   Hemoglobin 14.9  12.0 - 15.0 g/dL   HCT 42.5  36.0 - 46.0 %   MCV 89.3  78.0 - 100.0 fL   MCH 31.3  26.0 - 34.0 pg   MCHC 35.1  30.0 - 36.0 g/dL   RDW 12.2  11.5 - 15.5 %   Platelets 172  150 - 400 K/uL   Neutrophils Relative % 59  43 - 77 %   Neutro Abs 5.6  1.7 - 7.7 K/uL   Lymphocytes Relative 31  12 - 46 %   Lymphs Abs 3.0  0.7 - 4.0 K/uL   Monocytes Relative 6  3 - 12 %   Monocytes Absolute 0.6  0.1 - 1.0 K/uL   Eosinophils Relative 4  0 - 5 %   Eosinophils Absolute 0.4  0.0 - 0.7 K/uL   Basophils Relative 0  0 - 1 %   Basophils Absolute 0.0  0.0 - 0.1 K/uL  COMPREHENSIVE METABOLIC PANEL      Result Value Ref Range   Sodium 139  137 - 147 mEq/L   Potassium 4.4  3.7 - 5.3 mEq/L   Chloride 103  96 - 112 mEq/L   CO2 23  19 - 32 mEq/L   Glucose, Bld 100 (*) 70 - 99 mg/dL   BUN 15  6 - 23 mg/dL   Creatinine, Ser 0.79  0.50 - 1.10 mg/dL   Calcium 9.6  8.4 - 10.5 mg/dL   Total Protein 7.3  6.0 - 8.3 g/dL   Albumin 4.0  3.5 - 5.2 g/dL   AST 15  0 - 37 U/L   ALT 12  0 - 35 U/L   Alkaline Phosphatase 97  39 - 117 U/L   Total Bilirubin 0.6  0.3 - 1.2 mg/dL   GFR calc non Af Amer >90  >90 mL/min   GFR calc Af Amer >90  >90 mL/min   Anion gap 13  5 - 15  URINALYSIS, ROUTINE W REFLEX MICROSCOPIC      Result Value Ref Range   Color, Urine YELLOW  YELLOW   APPearance CLOUDY (*) CLEAR   Specific Gravity, Urine >1.030 (*) 1.005 - 1.030   pH 5.5  5.0 - 8.0   Glucose, UA NEGATIVE  NEGATIVE  mg/dL   Hgb urine dipstick NEGATIVE  NEGATIVE   Bilirubin Urine SMALL (*) NEGATIVE   Ketones, ur TRACE (*) NEGATIVE mg/dL   Protein, ur NEGATIVE  NEGATIVE mg/dL   Urobilinogen, UA 0.2  0.0 - 1.0 mg/dL   Nitrite NEGATIVE  NEGATIVE   Leukocytes, UA NEGATIVE  NEGATIVE  PREGNANCY, URINE      Result Value Ref Range   Preg Test, Ur NEGATIVE  NEGATIVE  LIPASE, BLOOD      Result Value Ref Range   Lipase 35  11 - 59 U/L    Medications  0.9 %  sodium chloride infusion ( Intravenous New Bag/Given 11/05/13 2129)  promethazine (PHENERGAN) injection 12.5 mg (12.5 mg Intravenous Given 11/05/13 2015)  sodium chloride 0.9 % bolus 1,000 mL (0 mLs Intravenous Stopped 11/05/13 2128)  morphine 2 MG/ML injection 2 mg (2 mg Intravenous Given 11/05/13 2016)  iohexol (OMNIPAQUE) 300 MG/ML solution 50 mL (50 mLs Oral Contrast Given 11/05/13 2034)  promethazine (PHENERGAN) injection 12.5 mg (12.5 mg Intravenous Given 11/05/13 2048)  iohexol (OMNIPAQUE) 300 MG/ML solution 100 mL (100 mLs Intravenous Contrast Given 11/05/13 2155)    Imaging Review Ct Abdomen Pelvis W Contrast  11/05/2013   CLINICAL DATA:  Right-sided abdominal pain for 2 days. Nausea, vomiting, diarrhea.  EXAM: CT ABDOMEN AND PELVIS WITH CONTRAST  TECHNIQUE: Multidetector CT imaging of the abdomen and pelvis was performed using the standard protocol following bolus administration of intravenous contrast.  CONTRAST:  20mL OMNIPAQUE IOHEXOL 300 MG/ML SOLN, 151mL OMNIPAQUE IOHEXOL 300 MG/ML SOLN  COMPARISON:  05/20/2013  FINDINGS: Atelectasis in the lung bases.  The liver, spleen, gallbladder, pancreas, adrenal glands, kidneys, abdominal aorta, inferior vena cava, and retroperitoneal lymph nodes are unremarkable. Scattered mesenteric  lymph nodes are not pathologically enlarged and are probably reactive. Stomach, small bowel, and colon are normal for degree of distention. Stool-filled colon without wall thickening. No free air or free fluid in the abdomen.  Minimal umbilical hernia containing fat.  Pelvis: Uterus and ovaries are not enlarged. No pelvic mass or lymphadenopathy. Appendix is normal. No evidence of diverticulitis. No free or loculated pelvic fluid collections. No destructive bone lesions.  IMPRESSION: No focal acute process demonstrated in the abdomen or pelvis.   Electronically Signed   By: Lucienne Capers M.D.   On: 11/05/2013 22:09     EKG Interpretation None      MDM   Final diagnoses:  Abdominal pain in female    Workup here today for the lower Donald pain without any significant findings. No leukocytosis no significant anemia urinalysis negative for urinary tract infection pregnancy test negative no evidence of elevated lipase for pancreatitis. Liver function tests without any significant abnormalities. Patient improved here with pain medicine and antinausea medicine will be continued. Patient had been followed in the past by GYN for this she is now back into the area referral to GYN provided.  I personally performed the services described in this documentation, which was scribed in my presence. The recorded information has been reviewed and is accurate.     Fredia Sorrow, MD 11/05/13 2350

## 2013-11-13 NOTE — Telephone Encounter (Signed)
Pt has appointment at [place] on Sept. 3 at 9:45

## 2013-11-15 ENCOUNTER — Encounter: Payer: Self-pay | Admitting: Internal Medicine

## 2013-11-16 ENCOUNTER — Encounter (HOSPITAL_COMMUNITY): Payer: Self-pay | Admitting: Emergency Medicine

## 2013-11-16 ENCOUNTER — Emergency Department (HOSPITAL_COMMUNITY)
Admission: EM | Admit: 2013-11-16 | Discharge: 2013-11-16 | Discharge status: Against Medical Advice | Payer: Self-pay | Attending: Emergency Medicine | Admitting: Emergency Medicine

## 2013-11-16 DIAGNOSIS — N949 Unspecified condition associated with female genital organs and menstrual cycle: Secondary | ICD-10-CM | POA: Insufficient documentation

## 2013-11-16 DIAGNOSIS — K219 Gastro-esophageal reflux disease without esophagitis: Secondary | ICD-10-CM | POA: Insufficient documentation

## 2013-11-16 DIAGNOSIS — Z859 Personal history of malignant neoplasm, unspecified: Secondary | ICD-10-CM | POA: Insufficient documentation

## 2013-11-16 DIAGNOSIS — Z91040 Latex allergy status: Secondary | ICD-10-CM | POA: Insufficient documentation

## 2013-11-16 DIAGNOSIS — Z860100 Personal history of colon polyps, unspecified: Secondary | ICD-10-CM | POA: Insufficient documentation

## 2013-11-16 DIAGNOSIS — R102 Pelvic and perineal pain: Secondary | ICD-10-CM

## 2013-11-16 DIAGNOSIS — F17200 Nicotine dependence, unspecified, uncomplicated: Secondary | ICD-10-CM | POA: Insufficient documentation

## 2013-11-16 DIAGNOSIS — Z9889 Other specified postprocedural states: Secondary | ICD-10-CM | POA: Insufficient documentation

## 2013-11-16 DIAGNOSIS — Z880 Allergy status to penicillin: Secondary | ICD-10-CM | POA: Insufficient documentation

## 2013-11-16 DIAGNOSIS — Z79899 Other long term (current) drug therapy: Secondary | ICD-10-CM | POA: Insufficient documentation

## 2013-11-16 DIAGNOSIS — Z791 Long term (current) use of non-steroidal anti-inflammatories (NSAID): Secondary | ICD-10-CM | POA: Insufficient documentation

## 2013-11-16 DIAGNOSIS — R51 Headache: Secondary | ICD-10-CM | POA: Insufficient documentation

## 2013-11-16 DIAGNOSIS — N898 Other specified noninflammatory disorders of vagina: Secondary | ICD-10-CM | POA: Insufficient documentation

## 2013-11-16 NOTE — ED Provider Notes (Signed)
CSN: [id number]     Arrival date & time 11/16/13  1808 History  This chart was scribed for Fredia Sorrow, MD by Einar Pheasant, ED Scribe. This patient was seen in room APA10/APA10 and the patient's care was started at 8:16 PM.    Chief Complaint  Patient presents with  . Vaginal Pain   Patient is a 28 y.o. female presenting with vaginal pain. The history is provided by the patient. No language interpreter was used.  Vaginal Pain This is a new problem. The current episode started 2 days ago. The problem occurs constantly. The problem has not changed since onset.Associated symptoms include headaches (mild). Pertinent negatives include no chest pain, no abdominal pain and no shortness of breath. The symptoms are aggravated by intercourse. Nothing relieves the symptoms. She has tried nothing for the symptoms.   HPI Comments:  Patricia Rivers is a 28 y.o. female brought in by parents to the Emergency Department complaining of vaginal pain that started 2 days. Pt was seen here at the ED on 8/24 for suprapubic pain and was referred to the Dr. Glo Herring. Pt states that she was unable to go to the referred doctor secondary to lapse of insurance. Pt states that this current episode started after having intercourse with her husband. She states that she started experiencing a "burning" sensation, which she describes as having "fire ants in her vagina" . Pt states that her pain is worsened by having intercourse, but it is always constant. Denies any hx of bleeding easily.  Past Medical History  Diagnosis Date  . Right ankle injury   . Colon polyp   . Rectal polyp   . Carcinoid tumor of rectum   . 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 GERD (gastroesophageal reflux disease)    Past Surgical History  Procedure Laterality Date  . Colonoscopy with esophagogastroduodenoscopy (egd) N/A 05/16/2013    Dr. Gala Romney: rectal polyp with low grade carcinoid tumor, colon polyp with tubulovillous  adenoma with high grade dysplasia. Needs repeat colonoscopy in 3 months. EGD with antral erosions, duodenal bulb erosions, superficial ulceration. path with mild chronic gastritis   . Colonoscopy with propofol N/A 09/06/2013    RKY:HCWCBJ ileocolonoscopy. No evidence  of residual polyp anywhere   Family History  Problem Relation Age of Onset  . Colon cancer Maternal Grandmother   . Cervical cancer Mother   . Asthma    . Diabetes     History  Substance Use Topics  . Smoking status: Current Every Day Smoker -- 1.50 packs/day for 11 years    Types: Cigarettes  . Smokeless tobacco: Not on file  . Alcohol Use: Yes     Comment: occasionally   OB History   Grav Para Term Preterm Abortions TAB SAB Ect Mult Living                 Review of Systems  Constitutional: Negative for fever and chills.  HENT: Negative for congestion, ear pain and sore throat.   Eyes: Negative for visual disturbance.  Respiratory: Negative for cough and shortness of breath.   Cardiovascular: Negative for chest pain and leg swelling.  Gastrointestinal: Negative for nausea, vomiting, abdominal pain and diarrhea.  Genitourinary: Positive for vaginal discharge (clear but with odor) and vaginal pain. Negative for vaginal bleeding.  Musculoskeletal: Negative for back pain, joint swelling and neck pain.  Skin: Negative for rash.  Neurological: Positive for headaches (mild).  Hematological: Does not bruise/bleed  easily.  Psychiatric/Behavioral: Negative for confusion.      Allergies  Codeine; Hydromorphone hcl; Nsaids; Penicillins; Bentyl; Latex; and Tape  Home Medications   Prior to Admission medications   Medication Sig Start Date End Date Taking? Authorizing Provider  dexlansoprazole (DEXILANT) 60 MG capsule Take 60 mg by mouth daily.   Yes Historical Provider, MD  meloxicam (MOBIC) 15 MG tablet Take 15 mg by mouth daily.   Yes Historical Provider, MD  PARoxetine (PAXIL) 10 MG tablet Take 10 mg by mouth  daily.   Yes Historical Provider, MD   Triage Vitals: BP 120/81  Pulse 99  Resp 18  Wt 218 lb (98.884 kg)  SpO2 99%   Physical Exam  Nursing note and vitals reviewed. Constitutional: She is oriented to person, place, and time. She appears well-developed and well-nourished. No distress.  HENT:  Head: Normocephalic and atraumatic.  Right Ear: External ear normal.  Left Ear: External ear normal.  Eyes: Conjunctivae and EOM are normal. Pupils are equal, round, and reactive to light.  Neck: Normal range of motion. Neck supple.  Cardiovascular: Normal rate, regular rhythm and normal heart sounds.  Exam reveals no gallop and no friction rub.   No murmur heard. Pulmonary/Chest: Effort normal and breath sounds normal. No respiratory distress. She has no wheezes. She has no rales.  Abdominal: Soft. Bowel sounds are normal. She exhibits no distension. There is no tenderness. There is no rebound.  Musculoskeletal: Normal range of motion. She exhibits no edema.  Neurological: She is alert and oriented to person, place, and time. No cranial nerve deficit. Coordination normal.  Skin: Skin is warm and dry.  Psychiatric: She has a normal mood and affect. Her behavior is normal.    ED Course  Procedures (including critical care time)  Medications - No data to display  Results for orders placed during the hospital encounter of 11/05/13  CBC WITH DIFFERENTIAL      Result Value Ref Range   WBC 9.7  4.0 - 10.5 K/uL   RBC 4.76  3.87 - 5.11 MIL/uL   Hemoglobin 14.9  12.0 - 15.0 g/dL   HCT 42.5  36.0 - 46.0 %   MCV 89.3  78.0 - 100.0 fL   MCH 31.3  26.0 - 34.0 pg   MCHC 35.1  30.0 - 36.0 g/dL   RDW 12.2  11.5 - 15.5 %   Platelets 172  150 - 400 K/uL   Neutrophils Relative % 59  43 - 77 %   Neutro Abs 5.6  1.7 - 7.7 K/uL   Lymphocytes Relative 31  12 - 46 %   Lymphs Abs 3.0  0.7 - 4.0 K/uL   Monocytes Relative 6  3 - 12 %   Monocytes Absolute 0.6  0.1 - 1.0 K/uL   Eosinophils Relative 4  0 -  5 %   Eosinophils Absolute 0.4  0.0 - 0.7 K/uL   Basophils Relative 0  0 - 1 %   Basophils Absolute 0.0  0.0 - 0.1 K/uL  COMPREHENSIVE METABOLIC PANEL      Result Value Ref Range   Sodium 139  137 - 147 mEq/L   Potassium 4.4  3.7 - 5.3 mEq/L   Chloride 103  96 - 112 mEq/L   CO2 23  19 - 32 mEq/L   Glucose, Bld 100 (*) 70 - 99 mg/dL   BUN 15  6 - 23 mg/dL   Creatinine, Ser 0.79  0.50 - 1.10 mg/dL  Calcium 9.6  8.4 - 10.5 mg/dL   Total Protein 7.3  6.0 - 8.3 g/dL   Albumin 4.0  3.5 - 5.2 g/dL   AST 15  0 - 37 U/L   ALT 12  0 - 35 U/L   Alkaline Phosphatase 97  39 - 117 U/L   Total Bilirubin 0.6  0.3 - 1.2 mg/dL   GFR calc non Af Amer >90  >90 mL/min   GFR calc Af Amer >90  >90 mL/min   Anion gap 13  5 - 15  URINALYSIS, ROUTINE W REFLEX MICROSCOPIC      Result Value Ref Range   Color, Urine YELLOW  YELLOW   APPearance CLOUDY (*) CLEAR   Specific Gravity, Urine >1.030 (*) 1.005 - 1.030   pH 5.5  5.0 - 8.0   Glucose, UA NEGATIVE  NEGATIVE mg/dL   Hgb urine dipstick NEGATIVE  NEGATIVE   Bilirubin Urine SMALL (*) NEGATIVE   Ketones, ur TRACE (*) NEGATIVE mg/dL   Protein, ur NEGATIVE  NEGATIVE mg/dL   Urobilinogen, UA 0.2  0.0 - 1.0 mg/dL   Nitrite NEGATIVE  NEGATIVE   Leukocytes, UA NEGATIVE  NEGATIVE  PREGNANCY, URINE      Result Value Ref Range   Preg Test, Ur NEGATIVE  NEGATIVE  LIPASE, BLOOD      Result Value Ref Range   Lipase 35  11 - 59 U/L   Dg Lumbar Spine Complete  10/19/2013   CLINICAL DATA:  Lower back pain.  Status post fall.  EXAM: LUMBAR SPINE - COMPLETE 4+ VIEW  COMPARISON:  CT of the abdomen and pelvis from 05/20/2013  FINDINGS: There is no evidence of fracture or subluxation. Vertebral bodies demonstrate normal height and alignment. Intervertebral disc spaces are preserved. The visualized neural foramina are grossly unremarkable in appearance.  The visualized bowel gas pattern is unremarkable in appearance; air and stool are noted within the colon. Mild  sclerotic change is noted at the sacroiliac joints.  IMPRESSION: No evidence of fracture or subluxation along the lumbar spine.   Electronically Signed   By: Garald Balding M.D.   On: 10/19/2013 23:41   Dg Sacrum/coccyx  10/19/2013   CLINICAL DATA:  Lumbar and tail bone pain after syncopal episode and fall today  EXAM: SACRUM AND COCCYX - 2+ VIEW  COMPARISON:  None.  FINDINGS: There is no evidence of fracture or other focal bone lesions  IMPRESSION: Negative.   Electronically Signed   By: Skipper Cliche M.D.   On: 10/19/2013 23:41   Dg Abd 1 View  10/30/2013   CLINICAL DATA:  Right upper quadrant pain and swelling. Constipated.  EXAM: ABDOMEN - 1 VIEW  COMPARISON:  06/05/2013.  FINDINGS: The bowel gas pattern is normal. No radio-opaque calculi or other significant radiographic abnormality are seen.  IMPRESSION: Negative.   Electronically Signed   By: Lajean Manes M.D.   On: 10/30/2013 13:55   Ct Head Wo Contrast  10/19/2013   CLINICAL DATA:  fall, LOC  EXAM: CT HEAD WITHOUT CONTRAST  CT CERVICAL SPINE WITHOUT CONTRAST  TECHNIQUE: Multidetector CT imaging of the head and cervical spine was performed following the standard protocol without intravenous contrast. Multiplanar CT image reconstructions of the cervical spine were also generated.  COMPARISON:  None.  FINDINGS: CT HEAD FINDINGS  There is no acute intracranial hemorrhage or infarct. No mass lesion or midline shift. Gray-white matter differentiation is well maintained. Ventricles are normal in size without evidence of hydrocephalus. CSF  containing spaces are within normal limits. No extra-axial fluid collection.  The calvarium is intact.  Orbital soft tissues are within normal limits.  The paranasal sinuses and mastoid air cells are well pneumatized and free of fluid.  Scalp soft tissues are unremarkable.  CT CERVICAL SPINE FINDINGS  The vertebral bodies are normally aligned with preservation of the normal cervical lordosis. Vertebral body heights are  preserved. Normal C1-2 articulations are intact. No prevertebral soft tissue swelling. No acute fracture or listhesis.  Visualized soft tissues of the neck are within normal limits. Visualized lung apices are clear without evidence of apical pneumothorax.  IMPRESSION: CT HEAD:  No acute intracranial process.  CT CERVICAL SPINE:  No acute traumatic injury within the cervical spine.   Electronically Signed   By: Jeannine Boga M.D.   On: 10/19/2013 23:44   Ct Cervical Spine Wo Contrast  10/19/2013   CLINICAL DATA:  fall, LOC  EXAM: CT HEAD WITHOUT CONTRAST  CT CERVICAL SPINE WITHOUT CONTRAST  TECHNIQUE: Multidetector CT imaging of the head and cervical spine was performed following the standard protocol without intravenous contrast. Multiplanar CT image reconstructions of the cervical spine were also generated.  COMPARISON:  None.  FINDINGS: CT HEAD FINDINGS  There is no acute intracranial hemorrhage or infarct. No mass lesion or midline shift. Gray-white matter differentiation is well maintained. Ventricles are normal in size without evidence of hydrocephalus. CSF containing spaces are within normal limits. No extra-axial fluid collection.  The calvarium is intact.  Orbital soft tissues are within normal limits.  The paranasal sinuses and mastoid air cells are well pneumatized and free of fluid.  Scalp soft tissues are unremarkable.  CT CERVICAL SPINE FINDINGS  The vertebral bodies are normally aligned with preservation of the normal cervical lordosis. Vertebral body heights are preserved. Normal C1-2 articulations are intact. No prevertebral soft tissue swelling. No acute fracture or listhesis.  Visualized soft tissues of the neck are within normal limits. Visualized lung apices are clear without evidence of apical pneumothorax.  IMPRESSION: CT HEAD:  No acute intracranial process.  CT CERVICAL SPINE:  No acute traumatic injury within the cervical spine.   Electronically Signed   By: Jeannine Boga  M.D.   On: 10/19/2013 23:44   Ct Abdomen Pelvis W Contrast  11/05/2013   CLINICAL DATA:  Right-sided abdominal pain for 2 days. Nausea, vomiting, diarrhea.  EXAM: CT ABDOMEN AND PELVIS WITH CONTRAST  TECHNIQUE: Multidetector CT imaging of the abdomen and pelvis was performed using the standard protocol following bolus administration of intravenous contrast.  CONTRAST:  67mL OMNIPAQUE IOHEXOL 300 MG/ML SOLN, 135mL OMNIPAQUE IOHEXOL 300 MG/ML SOLN  COMPARISON:  05/20/2013  FINDINGS: Atelectasis in the lung bases.  The liver, spleen, gallbladder, pancreas, adrenal glands, kidneys, abdominal aorta, inferior vena cava, and retroperitoneal lymph nodes are unremarkable. Scattered mesenteric lymph nodes are not pathologically enlarged and are probably reactive. Stomach, small bowel, and colon are normal for degree of distention. Stool-filled colon without wall thickening. No free air or free fluid in the abdomen. Minimal umbilical hernia containing fat.  Pelvis: Uterus and ovaries are not enlarged. No pelvic mass or lymphadenopathy. Appendix is normal. No evidence of diverticulitis. No free or loculated pelvic fluid collections. No destructive bone lesions.  IMPRESSION: No focal acute process demonstrated in the abdomen or pelvis.   Electronically Signed   By: Lucienne Capers M.D.   On: 11/05/2013 22:09   8:19 PM- Pt advised of plan for treatment and pt agrees.  8:34  PM- Pt left against medical advice.   MDM   Final diagnoses:  Vaginal pain    Patient seen by me on the 25th. That was more for pelvic pain. Today patient with complaint of burning sensation with a clear discharge associated with intercourse. Plan for today explained to the patient was check urinalysis check a pregnancy test and do a pelvic along with wet prep and cultures and referral to GYN. Patient has not yet followed up with GYN.  Worse informed us that before we went in to do the pelvic that she had left AMA.  I personally performed  the services described in this documentation, which was scribed in my presence. The recorded information has been reviewed and is accurate.    Fredia Sorrow, MD 11/16/13 2043

## 2013-11-16 NOTE — ED Notes (Addendum)
After sex last night, began having severe vaginal burning, pain and discharge with itching on labia.

## 2013-11-16 NOTE — ED Notes (Signed)
Pt walked out of room and informed nurse that she was going to f/u with Dr. Glo Herring, EDP had seen pt and had informed pt pelvic exam would be done, EDP made aware of pt left AMA, unable to get pt to sign AMA form

## 2013-11-16 NOTE — ED Notes (Signed)
MD at bedside. 

## 2013-11-21 ENCOUNTER — Encounter: Payer: Self-pay | Admitting: Gastroenterology

## 2013-11-21 ENCOUNTER — Ambulatory Visit (INDEPENDENT_AMBULATORY_CARE_PROVIDER_SITE_OTHER): Payer: Self-pay | Admitting: Gastroenterology

## 2013-11-21 VITALS — BP 122/80 | HR 69 | Temp 98.4°F | Ht 68.0 in | Wt 217.6 lb

## 2013-11-21 DIAGNOSIS — R197 Diarrhea, unspecified: Secondary | ICD-10-CM

## 2013-11-21 DIAGNOSIS — K529 Noninfective gastroenteritis and colitis, unspecified: Secondary | ICD-10-CM

## 2013-11-21 DIAGNOSIS — R112 Nausea with vomiting, unspecified: Secondary | ICD-10-CM | POA: Insufficient documentation

## 2013-11-21 DIAGNOSIS — R1011 Right upper quadrant pain: Secondary | ICD-10-CM

## 2013-11-21 MED ORDER — CILIDINIUM-CHLORDIAZEPOXIDE 2.5-5 MG PO CAPS: 1.0000 | ORAL_CAPSULE | Freq: Four times a day (QID) | ORAL | Status: DC | PRN

## 2013-11-21 NOTE — Progress Notes (Signed)
Primary Care Physician: No PCP Per Patient  Primary Gastroenterologist:  Garfield Cornea, MD   Chief Complaint  Patient presents with  . Follow-up    vomiting and diarrhea x 2 days    HPI: Patricia Rivers is a 28 y.o. female here for followup. She was last seen in the office back in June to schedule surveillance colonoscopy for history of carcinoid tumor and tubulovillous colonic adenoma with high-grade dysplasia noted back in March of this year. Followup colonoscopy in June was unremarkable. No evidence residual polyp anywhere. She will have her next surveillance colonoscopy in 3 years. She has a history of chronic diarrhea and abdominal pain. Bentyl made her sleepy and ?rash. Unable to afford Levsin. Stool studies negative in February. Multiple ER visits this year, at least 32 within our facility.  Patient evaluated in ER 11/16/12 (left AMA prior to completed evaluation ie pelvic exam) and 11/05/13 for suprapubic pain. Also associated N/V/D, dysuria, headaches. CT unremarkable. Labs unremarkable.  Recent ER visits:  11/16/13 11/05/13 11/03/13 10/23/13 10/19/13 10/05/13 08/24/13 Twelve total this year.  Presented with mother-in-law today who interjects throughout the exam. Patient's history is somewhat unreliable. She described remote constipation, having to use enema however this was just a couple of weeks ago when she called in with these complaints. Past multiple times and she continued to say was several months ago for her initial colonoscopy. Otherwise she states she's been having diarrhea daily. States has been worse the last 2 weeks.  BM 3 times daily. +nocturnal symptoms. Vomiting last couple of days. Temp 101, but went away after one dose of Tylenol. No melena, brbpr. Currently not taking imodium. Burning on bottom with diarrhea. No heartburn. No NSAIDs/ASA. Dysuria chronically. 5-6 glasses of tea per day. Couple of sodas. Bacon sandwich this morning. Meat and veggies for dinner.  Popcorn. Vomiting without nausea. Feels like food sits in chest and not going down well. Recent EGD without stricture.Ruq pain with vomiting, diarrhea. Nothing seems to help. Tried heating pad. Referral to [facility]. Redo u/s scheduled 12/07/13, [facility].   No menstrual cycles since age 26. Start Depo shots then, stopped 2011 but no menses.   Current Outpatient Prescriptions  Medication Sig Dispense Refill  . dexlansoprazole (DEXILANT) 60 MG capsule Take 60 mg by mouth daily.      . meloxicam (MOBIC) 15 MG tablet Take 15 mg by mouth daily.      [name] PARoxetine (PAXIL) 10 MG tablet Take 10 mg by mouth daily.       No current facility-administered medications for this visit.    Allergies as of 11/21/2013 - Review Complete 11/21/2013  Allergen Reaction Noted  . Codeine Nausea And Vomiting 12/15/2010  . Hydromorphone hcl Nausea And Vomiting 12/15/2010  . Nsaids  05/20/2013  . Penicillins Hives 12/15/2010  . Bentyl [dicyclomine hcl] Rash 08/22/2013  . Latex Rash 05/10/2013  . Tape Itching and Rash 05/10/2013    ROS:  General: Negative for anorexia, weight loss, fever, chills, fatigue, weakness. ENT: Negative for hoarseness, difficulty swallowing , nasal congestion. CV: Negative for chest pain, angina, palpitations, dyspnea on exertion, peripheral edema.  Respiratory: Negative for dyspnea at rest, dyspnea on exertion, cough, sputum, wheezing.  GI: See history of present illness. GU:  Negative for dysuria, hematuria, urinary incontinence, urinary frequency, nocturnal urination.  Endo: Negative for unusual weight change.    Physical Examination:   BP 122/80  Pulse 69  Temp(Src) 98.4 F (36.9 C) (Oral)  Ht 5'  8" (1.727 m)  Wt 217 lb 9.6 oz (98.703 kg)  BMI 33.09 kg/m2  General: Well-nourished, well-developed in no acute distress.  Eyes: No icterus. Mouth: Oropharyngeal mucosa moist and pink , no lesions erythema or exudate. Lungs: Clear to auscultation bilaterally.    Heart: Regular rate and rhythm, no murmurs rubs or gallops.  Abdomen: Bowel sounds are normal, nontender, nondistended, no hepatosplenomegaly or masses, no abdominal bruits or hernia , no rebound or guarding.   Extremities: No lower extremity edema. No clubbing or deformities. Neuro: Alert and oriented x 4   Skin: Warm and dry, no jaundice.   Psych: Alert and cooperative, normal mood and affect.  Labs:  Lab Results  Component Value Date   WBC 9.7 11/05/2013   HGB 14.9 11/05/2013   HCT 42.5 11/05/2013   MCV 89.3 11/05/2013   PLT 172 11/05/2013   Lab Results  Component Value Date   CREATININE 0.79 11/05/2013   BUN 15 11/05/2013   NA 139 11/05/2013   K 4.4 11/05/2013   CL 103 11/05/2013   CO2 23 11/05/2013   Lab Results  Component Value Date   ALT 12 11/05/2013   AST 15 11/05/2013   ALKPHOS 97 11/05/2013   BILITOT 0.6 11/05/2013   Lab Results  Component Value Date   LIPASE 35 11/05/2013    Imaging Studies: Dg Abd 1 View  10/30/2013   CLINICAL DATA:  Right upper quadrant pain and swelling. Constipated.  EXAM: ABDOMEN - 1 VIEW  COMPARISON:  06/05/2013.  FINDINGS: The bowel gas pattern is normal. No radio-opaque calculi or other significant radiographic abnormality are seen.  IMPRESSION: Negative.   Electronically Signed   By: Lajean Manes M.D.   On: 10/30/2013 13:55   Ct Abdomen Pelvis W Contrast  11/05/2013   CLINICAL DATA:  Right-sided abdominal pain for 2 days. Nausea, vomiting, diarrhea.  EXAM: CT ABDOMEN AND PELVIS WITH CONTRAST  TECHNIQUE: Multidetector CT imaging of the abdomen and pelvis was performed using the standard protocol following bolus administration of intravenous contrast.  CONTRAST:  68mL OMNIPAQUE IOHEXOL 300 MG/ML SOLN, 156mL OMNIPAQUE IOHEXOL 300 MG/ML SOLN  COMPARISON:  05/20/2013  FINDINGS: Atelectasis in the lung bases.  The liver, spleen, gallbladder, pancreas, adrenal glands, kidneys, abdominal aorta, inferior vena cava, and retroperitoneal lymph nodes are  unremarkable. Scattered mesenteric lymph nodes are not pathologically enlarged and are probably reactive. Stomach, small bowel, and colon are normal for degree of distention. Stool-filled colon without wall thickening. No free air or free fluid in the abdomen. Minimal umbilical hernia containing fat.  Pelvis: Uterus and ovaries are not enlarged. No pelvic mass or lymphadenopathy. Appendix is normal. No evidence of diverticulitis. No free or loculated pelvic fluid collections. No destructive bone lesions.  IMPRESSION: No focal acute process demonstrated in the abdomen or pelvis.   Electronically Signed   By: Lucienne Capers M.D.   On: 11/05/2013 22:09

## 2013-11-21 NOTE — Assessment & Plan Note (Signed)
Multiple GI complaints. Chronic diarrhea with intermittent RUQ pain associated with N/V. Not always related to meal. On Dexilant daily. Denies typical GERD. Will have pp vomiting without warning and without nausea. Mild dysphagia symptoms. No stricture noted on prior EGD. Poor diet. Never drinks water. Urine always concentrated. Is being evaluated at [facility] for consideration of gb surgery.  Discussed need for adequate hydration. Avoid dairy, greasy, spicy foods. Trial of librax. If cannot afford, then trial of Xifaxan or IBSGuard. Options likely limited by samples available given her financial concerns.  PR in 2 weeks.

## 2013-11-21 NOTE — Patient Instructions (Signed)
1. Start Librax one up to four times daily for diarrhea and abdominal pain. If you cannot afford this medication, please let me know.  2. Please start drinking at least 8 glasses of water daily. You really should be consuming at least 100 ounces of fluid daily to stay hydrated and keep urine light in color. 3. Follow up with surgeon as planned. Please keep [place] posted on possible gallbladder surgery. 4. Call in 2 weeks with progress report about vomiting, diarrhea, abdominal pain.  Irritable Bowel Syndrome Irritable bowel syndrome (IBS) is caused by a disturbance of normal bowel function and is a common digestive disorder. You may also hear this condition called spastic colon, mucous colitis, and irritable colon. There is no cure for IBS. However, symptoms often gradually improve or disappear with a good diet, stress management, and medicine. This condition usually appears in late adolescence or early adulthood. Women develop it twice as often as men. CAUSES  After food has been digested and absorbed in the small intestine, waste material is moved into the large intestine, or colon. In the colon, water and salts are absorbed from the undigested products coming from the small intestine. The remaining residue, or fecal material, is held for elimination. Under normal circumstances, gentle, rhythmic contractions of the bowel walls push the fecal material along the colon toward the rectum. In IBS, however, these contractions are irregular and poorly coordinated. The fecal material is either retained too long, resulting in constipation, or expelled too soon, producing diarrhea. SIGNS AND SYMPTOMS  The most common symptom of IBS is abdominal pain. It is often in the lower left side of the abdomen, but it may occur anywhere in the abdomen. The pain comes from spasms of the bowel muscles happening too much and from the buildup of gas and fecal material in the colon. This pain:  Can range from sharp abdominal cramps  to a dull, continuous ache.  Often worsens soon after eating.  Is often relieved by having a bowel movement or passing gas. Abdominal pain is usually accompanied by constipation, but it may also produce diarrhea. The diarrhea often occurs right after a meal or upon waking up in the morning. The stools are often soft, watery, and flecked with mucus. Other symptoms of IBS include:  Bloating.  Loss of appetite.  Heartburn.  Backache.  Dull pain in the arms or shoulders.  Nausea.  Burping.  Vomiting.  Gas. IBS may also cause symptoms that are unrelated to the digestive system, such as:  Fatigue.  Headaches.  Anxiety.  Shortness of breath.  Trouble concentrating.  Dizziness. These symptoms tend to come and go. DIAGNOSIS  The symptoms of IBS may seem like symptoms of other, more serious digestive disorders. Your health care provider may want to perform tests to exclude these disorders.  TREATMENT Many medicines are available to help correct bowel function or relieve bowel spasms and abdominal pain. Among the medicines available are:  Laxatives for severe constipation and to help restore normal bowel habits.  Specific antidiarrheal medicines to treat severe or lasting diarrhea.  Antispasmodic agents to relieve intestinal cramps. Your health care provider may also decide to treat you with a mild tranquilizer or sedative during unusually stressful periods in your life. Your health care provider may also prescribe antidepressant medicine. The use of this medicine has been shown to reduce pain and other symptoms of IBS. Remember that if any medicine is prescribed for you, you should take it exactly as directed. Make sure your  health care provider knows how well it worked for you. HOME CARE INSTRUCTIONS   Take all medicines as directed by your health care provider.  Avoid foods that are high in fat or oils, such as heavy cream, butter, frankfurters, sausage, and other fatty  meats.  Avoid foods that make you go to the bathroom, such as fruit, fruit juice, and dairy products.  Cut out carbonated drinks, chewing gum, and "gassy" foods such as beans and cabbage. This may help relieve bloating and burping.  Eat foods with bran, and drink plenty of liquids with the bran foods. This helps relieve constipation.  Keep track of what foods seem to bring on your symptoms.  Avoid emotionally charged situations or circumstances that produce anxiety.  Start or continue exercising.  Get plenty of rest and sleep. Document Released: 03/01/2005 Document Revised: 03/06/2013 Document Reviewed: 10/20/2007 [facility] Patient Information 2015 [place], [place]. This information is not intended to replace advice given to you by your health care provider. Make sure you discuss any questions you have with your health care provider.  Diet and Irritable Bowel Syndrome  No cure has been found for irritable bowel syndrome (IBS). Many options are available to treat the symptoms. Your caregiver will give you the best treatments available for your symptoms. He or she will also encourage you to manage stress and to make changes to your diet. You need to work with your caregiver and Registered Dietician to find the best combination of medicine, diet, counseling, and support to control your symptoms. The following are some diet suggestions. FOODS THAT MAKE IBS WORSE  Fatty foods, such as [place] fries.  Milk products, such as cheese or ice cream.  Chocolate.  Alcohol.  Caffeine (found in coffee and some sodas).  Carbonated drinks, such as soda. If certain foods cause symptoms, you should eat less of them or stop eating them. FOOD JOURNAL   Keep a journal of the foods that seem to cause distress. Write down:  What you are eating during the day and when.  What problems you are having after eating.  When the symptoms occur in relation to your meals.  What foods always make you feel  badly.  Take your notes with you to your caregiver to see if you should stop eating certain foods. FOODS THAT MAKE IBS BETTER Fiber reduces IBS symptoms, especially constipation, because it makes stools soft, bulky, and easier to pass. Fiber is found in bran, bread, cereal, beans, fruit, and vegetables. Examples of foods with fiber include:  Apples.  Peaches.  Pears.  Berries.  Figs.  Broccoli, raw.  Cabbage.  Carrots.  Raw peas.  Kidney beans.  Lima beans.  Whole-grain bread.  Whole-grain cereal. Add foods with fiber to your diet a little at a time. This will let your body get used to them. Too much fiber at once might cause gas and swelling of your abdomen. This can trigger symptoms in a person with IBS. Caregivers usually recommend a diet with enough fiber to produce soft, painless bowel movements. High fiber diets may cause gas and bloating. However, these symptoms often go away within a few weeks, as your body adjusts. In many cases, dietary fiber may lessen IBS symptoms, particularly constipation. However, it may not help pain or diarrhea. High fiber diets keep the colon mildly enlarged (distended) with the added fiber. This may help prevent spasms in the colon. Some forms of fiber also keep water in the stool, thereby preventing hard stools that are difficult  to pass.  Besides telling you to eat more foods with fiber, your caregiver may also tell you to get more fiber by taking a fiber pill or drinking water mixed with a special high fiber powder. An example of this is a natural fiber laxative containing psyllium seed.  TIPS  Large meals can cause cramping and diarrhea in people with IBS. If this happens to you, try eating 4 or 5 small meals a day, or try eating less at each of your usual 3 meals. It may also help if your meals are low in fat and high in carbohydrates. Examples of carbohydrates are pasta, rice, whole-grain breads and cereals, fruits, and vegetables.  If  dairy products cause your symptoms to flare up, you can try eating less of those foods. You might be able to handle yogurt better than other dairy products, because it contains bacteria that helps with digestion. Dairy products are an important source of calcium and other nutrients. If you need to avoid dairy products, be sure to talk with a Registered Dietitian about getting these nutrients through other food sources.  Drink enough water and fluids to keep your urine clear or pale yellow. This is important, especially if you have diarrhea. FOR MORE INFORMATION  International Foundation for Functional Gastrointestinal Disorders: www.iffgd.org  National Digestive Diseases Information Clearinghouse: digestive.AmenCredit.is Document Released: 05/22/2003 Document Revised: 05/24/2011 Document Reviewed: 06/01/2013 [facility] Patient Information 2015 [place], [place]. This information is not intended to replace advice given to you by your health care provider. Make sure you discuss any questions you have with your health care provider.

## 2013-11-30 ENCOUNTER — Ambulatory Visit: Payer: Self-pay | Admitting: Surgery

## 2013-12-05 ENCOUNTER — Emergency Department (HOSPITAL_COMMUNITY)
Admission: EM | Admit: 2013-12-05 | Discharge: 2013-12-05 | Disposition: A | Discharge status: Home / Self Care | Payer: Self-pay | Attending: Emergency Medicine | Admitting: Emergency Medicine

## 2013-12-05 ENCOUNTER — Emergency Department (HOSPITAL_COMMUNITY): Payer: Self-pay

## 2013-12-05 ENCOUNTER — Encounter (HOSPITAL_COMMUNITY): Payer: Self-pay | Admitting: Emergency Medicine

## 2013-12-05 DIAGNOSIS — Z91040 Latex allergy status: Secondary | ICD-10-CM | POA: Insufficient documentation

## 2013-12-05 DIAGNOSIS — Z791 Long term (current) use of non-steroidal anti-inflammatories (NSAID): Secondary | ICD-10-CM | POA: Insufficient documentation

## 2013-12-05 DIAGNOSIS — Z860100 Personal history of colon polyps, unspecified: Secondary | ICD-10-CM | POA: Insufficient documentation

## 2013-12-05 DIAGNOSIS — S6990XA Unspecified injury of unspecified wrist, hand and finger(s), initial encounter: Secondary | ICD-10-CM | POA: Insufficient documentation

## 2013-12-05 DIAGNOSIS — Y9289 Other specified places as the place of occurrence of the external cause: Secondary | ICD-10-CM | POA: Insufficient documentation

## 2013-12-05 DIAGNOSIS — S60212A Contusion of left wrist, initial encounter: Secondary | ICD-10-CM

## 2013-12-05 DIAGNOSIS — K219 Gastro-esophageal reflux disease without esophagitis: Secondary | ICD-10-CM | POA: Insufficient documentation

## 2013-12-05 DIAGNOSIS — Z79899 Other long term (current) drug therapy: Secondary | ICD-10-CM | POA: Insufficient documentation

## 2013-12-05 DIAGNOSIS — Z85048 Personal history of other malignant neoplasm of rectum, rectosigmoid junction, and anus: Secondary | ICD-10-CM | POA: Insufficient documentation

## 2013-12-05 DIAGNOSIS — Z880 Allergy status to penicillin: Secondary | ICD-10-CM | POA: Insufficient documentation

## 2013-12-05 DIAGNOSIS — W230XXA Caught, crushed, jammed, or pinched between moving objects, initial encounter: Secondary | ICD-10-CM | POA: Insufficient documentation

## 2013-12-05 DIAGNOSIS — Y9389 Activity, other specified: Secondary | ICD-10-CM | POA: Insufficient documentation

## 2013-12-05 DIAGNOSIS — S60219A Contusion of unspecified wrist, initial encounter: Secondary | ICD-10-CM | POA: Insufficient documentation

## 2013-12-05 DIAGNOSIS — F17200 Nicotine dependence, unspecified, uncomplicated: Secondary | ICD-10-CM | POA: Insufficient documentation

## 2013-12-05 MED ORDER — HYDROCODONE-ACETAMINOPHEN 5-325 MG PO TABS
2.0000 | ORAL_TABLET | Freq: Once | ORAL | Status: AC
  Administered 2013-12-05: 2 via ORAL
  Filled 2013-12-05: qty 2

## 2013-12-05 MED ORDER — HYDROCODONE-ACETAMINOPHEN 5-325 MG PO TABS: 1.0000 | ORAL_TABLET | ORAL | Status: DC | PRN

## 2013-12-05 MED ORDER — ONDANSETRON HCL 4 MG PO TABS
4.0000 mg | ORAL_TABLET | Freq: Once | ORAL | Status: AC
  Administered 2013-12-05: 4 mg via ORAL
  Filled 2013-12-05: qty 1

## 2013-12-05 NOTE — Discharge Instructions (Signed)
Your xrays are negative for fracture or dislocation. Please elevate wrist  Contusion A contusion is a deep bruise. Contusions happen when an injury causes bleeding under the skin. Signs of bruising include pain, puffiness (swelling), and discolored skin. The contusion may turn blue, purple, or yellow. HOME CARE   Put ice on the injured area.  Put ice in a plastic bag.  Place a towel between your skin and the bag.  Leave the ice on for 15-20 minutes, 03-04 times a day.  Only take medicine as told by your doctor.  Rest the injured area.  If possible, raise (elevate) the injured area to lessen puffiness. GET HELP RIGHT AWAY IF:   You have more bruising or puffiness.  You have pain that is getting worse.  Your puffiness or pain is not helped by medicine. MAKE SURE YOU:   Understand these instructions.  Will watch your condition.  Will get help right away if you are not doing well or get worse. Document Released: 08/18/2007 Document Revised: 05/24/2011 Document Reviewed: 01/04/2011 [facility] Patient Information 2015 [place], [place]. This information is not intended to replace advice given to you by your health care provider. Make sure you discuss any questions you have with your health care provider.  and apply ice.

## 2013-12-05 NOTE — ED Notes (Signed)
Pt closed left hand in house door. C/o pain from hand to elbow.

## 2013-12-05 NOTE — ED Provider Notes (Signed)
CSN: [id number]     Arrival date & time 12/05/13  1710 History   First MD Initiated Contact with Patient 12/05/13 1852     Chief Complaint  Patient presents with  . Hand Pain     (Consider location/radiation/quality/duration/timing/severity/associated sxs/prior Treatment) HPI Comments: And in emergency department with a complaint of hand and wrist pain. The patient states that earlier today she accidentally closed her left hand in a house door. She now has pain of the wrist and also pain of the left hand. No other injury reported. The patient denies being on any anticoagulation medications. She denies any bleeding disorders. There's been no recent operations or procedures involving the left upper extremity.  Patient is a 28 y.o. female presenting with hand pain. The history is provided by the patient.  Hand Pain This is a new problem. Associated symptoms include arthralgias. Pertinent negatives include no abdominal pain, chest pain, coughing or neck pain.    Past Medical History  Diagnosis Date  . Right ankle injury   . Colon polyp   . Rectal polyp   . Carcinoid tumor of rectum   . 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 GERD (gastroesophageal reflux disease)    Past Surgical History  Procedure Laterality Date  . Colonoscopy with esophagogastroduodenoscopy (egd) N/A 05/16/2013    Dr. Gala Romney: rectal polyp with low grade carcinoid tumor, colon polyp with tubulovillous adenoma with high grade dysplasia. Needs repeat colonoscopy in 3 months. EGD with antral erosions, duodenal bulb erosions, superficial ulceration. path with mild chronic gastritis   . Colonoscopy with propofol N/A 09/06/2013    HEN:IDPOEU ileocolonoscopy. No evidence  of residual polyp anywhere. Next TCS 08/2016.   Family History  Problem Relation Age of Onset  . Colon cancer Maternal Grandmother   . Cervical cancer Mother   . Asthma    . Diabetes     History  Substance Use Topics  . Smoking  status: Current Every Day Smoker -- 1.00 packs/day for 11 years    Types: Cigarettes  . Smokeless tobacco: Not on file     Comment: Smokes one pack of cigarettes daily  . Alcohol Use: Yes     Comment: occasionally   OB History   Grav Para Term Preterm Abortions TAB SAB Ect Mult Living                 Review of Systems  Constitutional: Negative for activity change.       All ROS Neg except as noted in HPI  Eyes: Negative for photophobia and discharge.  Respiratory: Negative for cough, shortness of breath and wheezing.   Cardiovascular: Negative for chest pain and palpitations.  Gastrointestinal: Negative for abdominal pain and blood in stool.  Genitourinary: Negative for dysuria, frequency and hematuria.  Musculoskeletal: Positive for arthralgias. Negative for back pain and neck pain.  Skin: Negative.   Neurological: Negative for dizziness, seizures and speech difficulty.  Psychiatric/Behavioral: Negative for hallucinations and confusion.      Allergies  Codeine; Hydromorphone hcl; Nsaids; Penicillins; Bentyl; Latex; and Tape  Home Medications   Prior to Admission medications   Medication Sig Start Date End Date Taking? Authorizing Provider  clidinium-chlordiazePOXIDE (LIBRAX) 5-2.5 MG per capsule Take 1 capsule by mouth 4 (four) times daily as needed (cramps, diarrhea). 11/21/13  Yes Mahala Menghini, PA-C  dexlansoprazole (DEXILANT) 60 MG capsule Take 60 mg by mouth daily.   Yes Historical Provider, MD  meloxicam (MOBIC) 15  MG tablet Take 15 mg by mouth daily.   Yes Historical Provider, MD  PARoxetine (PAXIL) 10 MG tablet Take 10 mg by mouth daily.   Yes Historical Provider, MD   BP 116/74  Pulse 68  Temp(Src) 98.6 F (37 C) (Oral)  Resp 14  Ht 5\' 7"  (1.702 m)  Wt 218 lb (98.884 kg)  BMI 34.14 kg/m2  SpO2 99%  LMP 12/05/2005 Physical Exam  Nursing note and vitals reviewed. Constitutional: She is oriented to person, place, and time. She appears well-developed and  well-nourished.  Non-toxic appearance.  HENT:  Head: Normocephalic.  Right Ear: Tympanic membrane and external ear normal.  Left Ear: Tympanic membrane and external ear normal.  Eyes: EOM and lids are normal. Pupils are equal, round, and reactive to light.  Neck: Normal range of motion. Neck supple. Carotid bruit is not present.  Cardiovascular: Normal rate, regular rhythm, normal heart sounds, intact distal pulses and normal pulses.   Pulmonary/Chest: Breath sounds normal. No respiratory distress.  Abdominal: Soft. Bowel sounds are normal. There is no tenderness. There is no guarding.  Musculoskeletal: Normal range of motion.       Left forearm: She exhibits tenderness and swelling.       Arms:      Hands: Lymphadenopathy:       Head (right side): No submandibular adenopathy present.       Head (left side): No submandibular adenopathy present.    She has no cervical adenopathy.  Neurological: She is alert and oriented to person, place, and time. She has normal strength. No cranial nerve deficit or sensory deficit.  Skin: Skin is warm and dry.  Psychiatric: She has a normal mood and affect. Her speech is normal.    ED Course  Procedures (including critical care time) Labs Review Labs Reviewed - No data to display  Imaging Review Dg Wrist Complete Left  12/05/2013   CLINICAL DATA:  28 year old female with pain in left wrist. Arm injury by a closing door.  EXAM: LEFT WRIST - COMPLETE 3+ VIEW  COMPARISON:  12/15/2010  FINDINGS: No acute bony abnormality identified. No significant soft tissue swelling. No radiopaque foreign body. Alignment maintained.  IMPRESSION: Negative.  Signed,  Dulcy Fanny. Earleen Newport, DO  [facility]  [facility]   Electronically Signed   By: Corrie Mckusick O.D.   On: 12/05/2013 18:14     EKG Interpretation None      MDM  X-ray of the left wrist is negative for fracture, and negative for foreign body. There no gross  neurovascular deficits noted on examination. Patient will be treated with wrist splint, ice pack, and prescription for Norco. Patient is to followup with Dr. Luna Glasgow for additional evaluation if not improving.     Final diagnoses:  None    *I have reviewed nursing notes, vital signs, and all appropriate lab and imaging results for this patient.Lenox Ahr, PA-C 12/06/13 1628

## 2013-12-06 ENCOUNTER — Telehealth: Payer: Self-pay

## 2013-12-06 NOTE — Telephone Encounter (Signed)
Routing to Earl who last saw patient.

## 2013-12-06 NOTE — Telephone Encounter (Signed)
Pt called this morning to let us know that the surgeon told her that she did not need to have her gallbladder out because it was working fine. She is still hurting and does not know what to do now. Please advise

## 2013-12-07 NOTE — Telephone Encounter (Signed)
2 CT A/P with CM this year, unremarkable. HIDA normal in 04/2013. Abd U/S 03/2013 at our facility, unremarkable. Recent abd u/s at [facility] per patient.  EGD/TCS as previously outlined.  Labs unremarkable in 10/2013.  Celiac screen negative. Stool studies negative Feb and March.  Need more information please. Where is her pain? Is she still having diarrhea? Is she vomiting? Is she taking Librax? Is she still on Dexilant? Does she have hot flashes/flushing?

## 2013-12-08 NOTE — ED Provider Notes (Signed)
Medical screening examination/treatment/procedure(s) were performed by non-physician practitioner and as supervising physician I was immediately available for consultation/collaboration.     Veryl Speak, MD 12/08/13 [phone]

## 2013-12-10 NOTE — Telephone Encounter (Signed)
Lets check 24 hour 5HIAA. Dx: diarrhea, H/O carcinoid tumor.   OV with RMR first available.

## 2013-12-10 NOTE — Telephone Encounter (Signed)
Pt said that the pain in her stomach is on the right side and then lower left near her ovary's. She has diarrhea all day long. Vomiting at time. She is still taking the Librax and the Dexilant. She is having hot flashes also. Please advise

## 2013-12-10 NOTE — Telephone Encounter (Signed)
LMOM to call back

## 2013-12-11 ENCOUNTER — Other Ambulatory Visit: Payer: Self-pay

## 2013-12-11 ENCOUNTER — Telehealth: Payer: Self-pay | Admitting: Internal Medicine

## 2013-12-11 DIAGNOSIS — Z86012 Personal history of benign carcinoid tumor: Secondary | ICD-10-CM

## 2013-12-11 DIAGNOSIS — R197 Diarrhea, unspecified: Secondary | ICD-10-CM

## 2013-12-11 NOTE — Telephone Encounter (Signed)
Pt called to speak with GF and was told GF was not available. Please call her back at [phone]

## 2013-12-11 NOTE — Telephone Encounter (Signed)
Talked with patient 

## 2013-12-11 NOTE — Telephone Encounter (Signed)
Order fax to lab and patient is aware.

## 2013-12-12 ENCOUNTER — Telehealth: Payer: Self-pay

## 2013-12-12 NOTE — Telephone Encounter (Signed)
Recommend she increase her fluid consumption, water. At last OV, clearing was not consuming adequate fluids. She should drink at minimum of 100 ounces of fluid daily.

## 2013-12-12 NOTE — Telephone Encounter (Signed)
Pt is calling because she is not urinate but a couple times a day. She is doing the 24 hours urine test now. I told her that she did not have to fill the jug  But just to put all her urine in it. She is wanting to know if something could be wrong with her kidneys. I asked her is she had gained any weight to see if she is holding fluid. Her weight on 12/05/13 was 216 and her weight today is 220. Please advise

## 2013-12-14 NOTE — Telephone Encounter (Signed)
Pt aware.

## 2013-12-18 ENCOUNTER — Telehealth: Payer: Self-pay

## 2013-12-18 LAB — 5 HIAA, QUANTITATIVE, URINE, 24 HOUR: 5-HIAA, 24 Hr Urine: 2.8 mg/24 h (ref <=6.0)

## 2013-12-18 NOTE — Telephone Encounter (Signed)
Urine test is unremarkable.  Please start her on Xifaxan 550mg  TID for 14 days. ?enough samples? I don't think she has drug coverage.  KEEP OV with RMR as scheduled.

## 2013-12-18 NOTE — Telephone Encounter (Signed)
Pt is calling about her urine test result. Pt is hurting ready bad but does not want to go to the ER because they don't do anything.Please advise

## 2013-12-18 NOTE — Telephone Encounter (Signed)
Pt is aware of results. She does not have any insurance so we are going to mail her some assistance paper work.

## 2013-12-26 ENCOUNTER — Encounter (HOSPITAL_COMMUNITY): Payer: Self-pay | Admitting: Emergency Medicine

## 2013-12-26 ENCOUNTER — Emergency Department (HOSPITAL_COMMUNITY)
Admission: EM | Admit: 2013-12-26 | Discharge: 2013-12-26 | Disposition: A | Discharge status: Home / Self Care | Payer: Self-pay | Attending: Emergency Medicine | Admitting: Emergency Medicine

## 2013-12-26 ENCOUNTER — Emergency Department (HOSPITAL_COMMUNITY): Payer: Self-pay

## 2013-12-26 DIAGNOSIS — Z79899 Other long term (current) drug therapy: Secondary | ICD-10-CM | POA: Insufficient documentation

## 2013-12-26 DIAGNOSIS — Y92828 Other wilderness area as the place of occurrence of the external cause: Secondary | ICD-10-CM | POA: Insufficient documentation

## 2013-12-26 DIAGNOSIS — Z791 Long term (current) use of non-steroidal anti-inflammatories (NSAID): Secondary | ICD-10-CM | POA: Insufficient documentation

## 2013-12-26 DIAGNOSIS — Z91040 Latex allergy status: Secondary | ICD-10-CM | POA: Insufficient documentation

## 2013-12-26 DIAGNOSIS — W01198A Fall on same level from slipping, tripping and stumbling with subsequent striking against other object, initial encounter: Secondary | ICD-10-CM | POA: Insufficient documentation

## 2013-12-26 DIAGNOSIS — Z85040 Personal history of malignant carcinoid tumor of rectum: Secondary | ICD-10-CM | POA: Insufficient documentation

## 2013-12-26 DIAGNOSIS — S86111A Strain of other muscle(s) and tendon(s) of posterior muscle group at lower leg level, right leg, initial encounter: Secondary | ICD-10-CM

## 2013-12-26 DIAGNOSIS — Z86010 Personal history of colonic polyps: Secondary | ICD-10-CM | POA: Insufficient documentation

## 2013-12-26 DIAGNOSIS — K219 Gastro-esophageal reflux disease without esophagitis: Secondary | ICD-10-CM | POA: Insufficient documentation

## 2013-12-26 DIAGNOSIS — Z720 Tobacco use: Secondary | ICD-10-CM | POA: Insufficient documentation

## 2013-12-26 DIAGNOSIS — S86811A Strain of other muscle(s) and tendon(s) at lower leg level, right leg, initial encounter: Secondary | ICD-10-CM | POA: Insufficient documentation

## 2013-12-26 DIAGNOSIS — Y9389 Activity, other specified: Secondary | ICD-10-CM | POA: Insufficient documentation

## 2013-12-26 DIAGNOSIS — Z880 Allergy status to penicillin: Secondary | ICD-10-CM | POA: Insufficient documentation

## 2013-12-26 MED ORDER — CYCLOBENZAPRINE HCL 5 MG PO TABS: 5.0000 mg | ORAL_TABLET | Freq: Three times a day (TID) | ORAL | Status: DC | PRN

## 2013-12-26 MED ORDER — HYDROCODONE-ACETAMINOPHEN 5-325 MG PO TABS: 1.0000 | ORAL_TABLET | ORAL | Status: DC | PRN

## 2013-12-26 MED ORDER — ONDANSETRON 8 MG PO TBDP
8.0000 mg | ORAL_TABLET | Freq: Once | ORAL | Status: AC
  Administered 2013-12-26: 8 mg via ORAL
  Filled 2013-12-26: qty 1

## 2013-12-26 MED ORDER — HYDROCODONE-ACETAMINOPHEN 5-325 MG PO TABS
1.0000 | ORAL_TABLET | Freq: Once | ORAL | Status: AC
  Administered 2013-12-26: 1 via ORAL
  Filled 2013-12-26: qty 1

## 2013-12-26 NOTE — Discharge Instructions (Signed)

## 2013-12-26 NOTE — ED Notes (Signed)
Pt reports Saturday, slipped on wet leaves and fell down a hill.  This morning woke up with pain in r leg.

## 2013-12-26 NOTE — ED Notes (Signed)
nad noted prior to dc. Dc instructions reviewed and explained. Voiced understanding and 2 Rx given prior to dc home.

## 2013-12-27 ENCOUNTER — Ambulatory Visit (INDEPENDENT_AMBULATORY_CARE_PROVIDER_SITE_OTHER): Payer: Self-pay | Admitting: Internal Medicine

## 2013-12-27 ENCOUNTER — Encounter: Payer: Self-pay | Admitting: Internal Medicine

## 2013-12-27 VITALS — BP 114/82 | HR 84 | Temp 97.0°F | Ht 67.5 in | Wt 220.2 lb

## 2013-12-27 DIAGNOSIS — R197 Diarrhea, unspecified: Secondary | ICD-10-CM

## 2013-12-27 NOTE — Patient Instructions (Signed)
Will provide [facility] primary care list to help establish a primary care physician  Get Librax filled as previously instructed or a trial of Xifaxin (whichever is more financially feasible)  Continue Dexilant daily   Office visit in 3 months

## 2013-12-27 NOTE — ED Provider Notes (Signed)
CSN: [id number]     Arrival date & time 12/26/13  1244 History   First MD Initiated Contact with Patient 12/26/13 1359     Chief Complaint  Patient presents with  . Leg Pain     (Consider location/radiation/quality/duration/timing/severity/associated sxs/prior Treatment) Patient is a 28 y.o. female presenting with leg pain. The history is provided by the patient.  Leg Pain Associated symptoms: no fever    Patricia Rivers is a 28 y.o. female presenting with persistent pain in her right lower leg after slipping on wet leaves, slliding down a hill and hitting her right anterior tibia against a tree 4 days ago. She has used rest and tylenol, ice and heat therapy and has had no improvement in pain.  She has increased pain with weight bearing and dependency of the extremity.  She has anterior pain, but also describes pain in her posterior mid calf which is worsened with ankle flexion.  She denies increased swelling of the leg except for the area of bruising anterior tibia which is improving.     Past Medical History  Diagnosis Date  . Right ankle injury   . Colon polyp   . Rectal polyp   . Carcinoid tumor of rectum   . 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 GERD (gastroesophageal reflux disease)    Past Surgical History  Procedure Laterality Date  . Colonoscopy with esophagogastroduodenoscopy (egd) N/A 05/16/2013    Dr. Gala Romney: rectal polyp with low grade carcinoid tumor, colon polyp with tubulovillous adenoma with high grade dysplasia. Needs repeat colonoscopy in 3 months. EGD with antral erosions, duodenal bulb erosions, superficial ulceration. path with mild chronic gastritis   . Colonoscopy with propofol N/A 09/06/2013    [id number] ileocolonoscopy. No evidence  of residual polyp anywhere. Next TCS 08/2016.   Family History  Problem Relation Age of Onset  . Colon cancer Maternal Grandmother   . Cervical cancer Mother   . Asthma    . Diabetes     History   Substance Use Topics  . Smoking status: Current Every Day Smoker -- 1.00 packs/day for 11 years    Types: Cigarettes  . Smokeless tobacco: Not on file     Comment: Smokes one pack of cigarettes daily  . Alcohol Use: Yes     Comment: occasionally   OB History   Grav Para Term Preterm Abortions TAB SAB Ect Mult Living                 Review of Systems  Constitutional: Negative for fever.  Musculoskeletal: Positive for arthralgias. Negative for joint swelling and myalgias.  Skin: Positive for color change.  Neurological: Negative for weakness and numbness.      Allergies  Codeine; Hydromorphone hcl; Nsaids; Penicillins; Bentyl; Latex; and Tape  Home Medications   Prior to Admission medications   Medication Sig Start Date End Date Taking? Authorizing Provider  acetaminophen (TYLENOL) 500 MG tablet Take 1,000 mg by mouth every 6 (six) hours as needed for moderate pain.   Yes Historical Provider, MD  dexlansoprazole (DEXILANT) 60 MG capsule Take 60 mg by mouth daily.   Yes Historical Provider, MD  meloxicam (MOBIC) 15 MG tablet Take 15 mg by mouth daily.   Yes Historical Provider, MD  PARoxetine (PAXIL) 10 MG tablet Take 10 mg by mouth daily.   Yes Historical Provider, MD  cyclobenzaprine (FLEXERIL) 5 MG tablet Take 1 tablet (5 mg total) by mouth 3 (three) times  daily as needed. 12/26/13   [name], PA-C  HYDROcodone-acetaminophen (NORCO/VICODIN) 5-325 MG per tablet Take 1 tablet by mouth every 4 (four) hours as needed. 12/26/13   [name], PA-C   BP 119/83  Pulse 78  Temp(Src) 98.2 F (36.8 C) (Oral)  Resp 16  Ht 5\' 7"  (1.702 m)  Wt 217 lb (98.431 kg)  BMI 33.98 kg/m2  SpO2 99%  LMP 12/05/2005 Physical Exam  Constitutional: She appears well-developed and well-nourished.  HENT:  Head: Atraumatic.  Neck: Normal range of motion.  Cardiovascular:  Pulses equal bilaterally  Musculoskeletal: She exhibits tenderness.       Right lower leg: She exhibits tenderness, bony  tenderness and edema. She exhibits no deformity.  Slight nonpitting edema right ankle.  Positive Homans sign.  No palpable cords or focal pain with palpation of calf.  Achilles tendon intact.  Dorsalis pedis pulse intact.  Healing bruise and small hematoma mild anterior tibia.  Less than 2 second cap refill in toes.  Neurological: She is alert. She has normal strength. She displays normal reflexes. No sensory deficit.  Skin: Skin is warm and dry.  Psychiatric: She has a normal mood and affect.    ED Course  Procedures (including critical care time) Labs Review Labs Reviewed - No data to display  Imaging Review Dg Tibia/fibula Right  12/26/2013   CLINICAL DATA:  [place] on Saturday hitting leg on tree with pain anteriorly  EXAM: RIGHT TIBIA AND FIBULA - 2 VIEW  COMPARISON:  None.  FINDINGS: The right tibia and fibula are in normal alignment. No acute fracture is seen. No significant soft tissue swelling or opaque foreign body is noted.  IMPRESSION: Negative.   Electronically Signed   By: Ivar Drape M.D.   On: 12/26/2013 15:39   US Venous Img Lower Unilateral Right  12/26/2013   CLINICAL DATA:  Right lower extremity pain  EXAM: RIGHT LOWER EXTREMITY VENOUS DUPLEX ULTRASOUND  TECHNIQUE: Doppler venous assessment of the right lower extremity deep venous system was performed, including characterization of spectral flow, compressibility, and phasicity.  COMPARISON:  None.  FINDINGS: There is complete compressibility of the right common femoral, femoral, and popliteal veins. Doppler analysis demonstrates respiratory phasicity and augmentation of flow upon calf compression.  IMPRESSION: No evidence of DVT.   Electronically Signed   By: Maryclare Bean M.D.   On: 12/26/2013 15:43     EKG Interpretation None      MDM   Final diagnoses:  Gastrocnemius muscle strain, right, initial encounter    Patients labs and/or radiological studies were viewed and considered during the medical decision making and  disposition process.  Pt discussed with Dr Thurnell Garbe during pt visit.  Suspect gastroc strain.  No exam findings suggesting muscle or achilles tendon tear.  Negative for dvt.  Advised continued rest,  Elevation, warm compresses.  Prescribed hydrocodone, flexeril.  Advised f/u with ortho if not improving over the next week.  Referral given.    [name], PA-C 12/27/13 [phone]

## 2013-12-27 NOTE — Progress Notes (Signed)
Primary Care Physician:  No PCP Per Patient Primary Gastroenterologist:  Dr. Gala Romney  Pre-Procedure History & Physical: HPI:  Patricia Rivers is a 28 y.o. female here for IBS. History of a small carcinoid in the rectum and a tubulovillous adenoma with high-grade dysplasia removed from the ascending colon earlier this year. Followup colonoscopy demonstrated no evidence of residual lesion anywhere. She is slated to have another colonoscopy in 3 years. Urinary 5 HIAA normal  She has quite a bit of postprandial abdominal cramps and diarrhea;  occasionally she has a normal stool. Wasn't able to afford Librax. Has not been able to get Xifaxan as of yet. No PCP. No periods. No recent thyroid assessment. Was in the ER last week for a gastrocnemius strain. She is on crutches.  Past Medical History  Diagnosis Date  . Right ankle injury   . Colon polyp   . Rectal polyp   . Carcinoid tumor of rectum   . 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 GERD (gastroesophageal reflux disease)     Past Surgical History  Procedure Laterality Date  . Colonoscopy with esophagogastroduodenoscopy (egd) N/A 05/16/2013    Dr. Gala Romney: rectal polyp with low grade carcinoid tumor, colon polyp with tubulovillous adenoma with high grade dysplasia. Needs repeat colonoscopy in 3 months. EGD with antral erosions, duodenal bulb erosions, superficial ulceration. path with mild chronic gastritis   . Colonoscopy with propofol N/A 09/06/2013    [facility]:IEPPIR ileocolonoscopy. No evidence  of residual polyp anywhere. Next TCS 08/2016.    Prior to Admission medications   Medication Sig Start Date End Date Taking? Authorizing Provider  acetaminophen (TYLENOL) 500 MG tablet Take 1,000 mg by mouth every 6 (six) hours as needed for moderate pain.   Yes Historical Provider, MD  cyclobenzaprine (FLEXERIL) 5 MG tablet Take 1 tablet (5 mg total) by mouth 3 (three) times daily as needed. 12/26/13  Yes [name], PA-C    dexlansoprazole (DEXILANT) 60 MG capsule Take 60 mg by mouth daily.   Yes Historical Provider, MD  HYDROcodone-acetaminophen (NORCO/VICODIN) 5-325 MG per tablet Take 1 tablet by mouth every 4 (four) hours as needed. 12/26/13  Yes [name], PA-C  meloxicam (MOBIC) 15 MG tablet Take 15 mg by mouth daily.   Yes Historical Provider, MD  PARoxetine (PAXIL) 10 MG tablet Take 10 mg by mouth daily.   Yes Historical Provider, MD    Allergies as of 12/27/2013 - Review Complete 12/27/2013  Allergen Reaction Noted  . Codeine Nausea And Vomiting 12/15/2010  . Hydromorphone hcl Nausea And Vomiting 12/15/2010  . Nsaids  05/20/2013  . Penicillins Hives 12/15/2010  . Bentyl [dicyclomine hcl] Rash 08/22/2013  . Latex Rash 05/10/2013  . Tape Itching and Rash 05/10/2013    Family History  Problem Relation Age of Onset  . Colon cancer Maternal Grandmother   . Cervical cancer Mother   . Asthma    . Diabetes      History   Social History  . Marital Status: Married    Spouse Name: N/A    Number of Children: N/A  . Years of Education: N/A   Occupational History  . Not on file.   Social History Main Topics  . Smoking status: Current Every Day Smoker -- 1.00 packs/day for 11 years    Types: Cigarettes  . Smokeless tobacco: Not on file     Comment: Smokes one pack of cigarettes daily  . Alcohol Use: Yes  Comment: occasionally  . Drug Use: No  . Sexual Activity: Yes    Birth Control/ Protection: None   Other Topics Concern  . Not on file   Social History Narrative  . No narrative on file    Review of Systems: See HPI, otherwise negative ROS  Physical Exam: BP 114/82  Pulse 84  Temp(Src) 97 F (36.1 C) (Oral)  Ht 5' 7.5" (1.715 m)  Wt 220 lb 3.2 oz (99.882 kg)  BMI 33.96 kg/m2  LMP 12/05/2005 General:   Alert,  Well-developed, well-nourished, pleasant and cooperative in NAD. Accompanied by her mother-in-law. Skin:  Intact without significant lesions or rashes. Eyes:   Sclera clear, no icterus.   Conjunctiva pink. Ears:  Normal auditory acuity. Nose:  No deformity, discharge,  or lesions. Mouth:  No deformity or lesions. Neck:  Supple; no masses or thyromegaly. No significant cervical adenopathy. Lungs:  Clear throughout to auscultation.   No wheezes, crackles, or rhonchi. No acute distress. Heart:  Regular rate and rhythm; no murmurs, clicks, rubs,  or gallops. Abdomen: Non-distended, normal bowel sounds.  Soft and nontender without appreciable mass or hepatosplenomegaly.  Pulses:  Normal pulses noted. Extremities:  Without clubbing or edema.  Impression:  GERD well controlled on Dexilant. Diarrhea predominant IBS. History of a rectal carcinoid removed completely. Tubulovillous adenoma with high-grade dysplasia completely resected as documented on followup colonoscopy. Family history of colon cancer only in a second-degree relative (grandmother) and advanced age.  Stressed  importance of having a primary care physician to oversee all of her health care needs. She needs help getting her medications. She has not had her thyroid checked in recent time  Recommendations:   Will provide [facility] primary care list to help establish a primary care physician  Get Librax filled as previously instructed or a trial of Xifaxin (whichever is more financially feasible)  Continue Dexilant daily;  check TSH  Office visit in 3 months   Notice: This dictation was prepared with Dragon dictation along with smaller phrase technology. Any transcriptional errors that result from this process are unintentional and may not be corrected upon review.

## 2013-12-28 LAB — TSH: TSH: 2.183 u[IU]/mL (ref 0.350–4.500)

## 2013-12-28 NOTE — ED Provider Notes (Signed)
Medical screening examination/treatment/procedure(s) were performed by non-physician practitioner and as supervising physician I was immediately available for consultation/collaboration.   EKG Interpretation None        Francine Graven, DO 12/28/13 0710

## 2014-01-23 ENCOUNTER — Emergency Department (HOSPITAL_COMMUNITY)
Admission: EM | Admit: 2014-01-23 | Discharge: 2014-01-23 | Disposition: A | Discharge status: Home / Self Care | Payer: Self-pay | Attending: Emergency Medicine | Admitting: Emergency Medicine

## 2014-01-23 ENCOUNTER — Emergency Department (HOSPITAL_COMMUNITY): Payer: Self-pay

## 2014-01-23 ENCOUNTER — Encounter (HOSPITAL_COMMUNITY): Payer: Self-pay | Admitting: *Deleted

## 2014-01-23 DIAGNOSIS — J0100 Acute maxillary sinusitis, unspecified: Secondary | ICD-10-CM | POA: Insufficient documentation

## 2014-01-23 DIAGNOSIS — Z86010 Personal history of colonic polyps: Secondary | ICD-10-CM | POA: Insufficient documentation

## 2014-01-23 DIAGNOSIS — Z87828 Personal history of other (healed) physical injury and trauma: Secondary | ICD-10-CM | POA: Insufficient documentation

## 2014-01-23 DIAGNOSIS — Z91040 Latex allergy status: Secondary | ICD-10-CM | POA: Insufficient documentation

## 2014-01-23 DIAGNOSIS — R05 Cough: Secondary | ICD-10-CM

## 2014-01-23 DIAGNOSIS — R112 Nausea with vomiting, unspecified: Secondary | ICD-10-CM | POA: Insufficient documentation

## 2014-01-23 DIAGNOSIS — Z791 Long term (current) use of non-steroidal anti-inflammatories (NSAID): Secondary | ICD-10-CM | POA: Insufficient documentation

## 2014-01-23 DIAGNOSIS — R059 Cough, unspecified: Secondary | ICD-10-CM

## 2014-01-23 DIAGNOSIS — Z85040 Personal history of malignant carcinoid tumor of rectum: Secondary | ICD-10-CM | POA: Insufficient documentation

## 2014-01-23 DIAGNOSIS — Z720 Tobacco use: Secondary | ICD-10-CM | POA: Insufficient documentation

## 2014-01-23 DIAGNOSIS — Z880 Allergy status to penicillin: Secondary | ICD-10-CM | POA: Insufficient documentation

## 2014-01-23 DIAGNOSIS — Z79899 Other long term (current) drug therapy: Secondary | ICD-10-CM | POA: Insufficient documentation

## 2014-01-23 DIAGNOSIS — K219 Gastro-esophageal reflux disease without esophagitis: Secondary | ICD-10-CM | POA: Insufficient documentation

## 2014-01-23 MED ORDER — HYDROCODONE-ACETAMINOPHEN 5-325 MG PO TABS
1.0000 | ORAL_TABLET | Freq: Once | ORAL | Status: AC
  Administered 2014-01-23: 1 via ORAL
  Filled 2014-01-23: qty 1

## 2014-01-23 MED ORDER — AZITHROMYCIN 250 MG PO TABS: ORAL_TABLET | ORAL | Status: DC

## 2014-01-23 MED ORDER — HYDROCODONE-ACETAMINOPHEN 5-325 MG PO TABS: 1.0000 | ORAL_TABLET | Freq: Four times a day (QID) | ORAL | Status: DC | PRN

## 2014-01-23 MED ORDER — AZITHROMYCIN 250 MG PO TABS
500.0000 mg | ORAL_TABLET | Freq: Once | ORAL | Status: AC
  Administered 2014-01-23: 500 mg via ORAL
  Filled 2014-01-23: qty 2

## 2014-01-23 NOTE — ED Provider Notes (Addendum)
CSN: [id number]     Arrival date & time 01/23/14  2035 History  This chart was scribe for Patricia Diego, MD by Judithann Sauger, ED Scribe. The patient was seen in room APA02/APA02 and the patient's care was started at 10:07 PM.    Chief Complaint  Patient presents with  . Diarrhea   Patient is a 28 y.o. female presenting with cough. The history is provided by the patient (Pt c/o cough and congestion ). No language interpreter was used.  Cough Cough characteristics:  Non-productive Onset quality:  Gradual Duration:  1 day Timing:  Intermittent Chronicity:  New Smoker: yes   Relieved by:  None tried Worsened by:  Nothing tried Ineffective treatments:  None tried Associated symptoms: chills, fever and sore throat   Associated symptoms: no chest pain, no eye discharge, no headaches and no rash   Fever:    Duration:  10 hours  HPI Comments: Patricia Rivers is a 28 y.o. female who presents to the Emergency Department complaining of a gradual onset of a non-productive cough and congestion, N/V onset yesterday after returning from a visit out of town. She reports a fever earlier in the day and chills onset yesterday. She also reports associated sore throat. Patient denies diarrhea. She reports smoking.   Past Medical History  Diagnosis Date  . Right ankle injury   . Colon polyp   . Rectal polyp   . Carcinoid tumor of rectum   . 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 GERD (gastroesophageal reflux disease)    Past Surgical History  Procedure Laterality Date  . Colonoscopy with esophagogastroduodenoscopy (egd) N/A 05/16/2013    Dr. Gala Romney: rectal polyp with low grade carcinoid tumor, colon polyp with tubulovillous adenoma with high grade dysplasia. Needs repeat colonoscopy in 3 months. EGD with antral erosions, duodenal bulb erosions, superficial ulceration. path with mild chronic gastritis   . Colonoscopy with propofol N/A 09/06/2013    [id number]  ileocolonoscopy. No evidence  of residual polyp anywhere. Next TCS 08/2016.   Family History  Problem Relation Age of Onset  . Colon cancer Maternal Grandmother   . Cervical cancer Mother   . Asthma    . Diabetes     History  Substance Use Topics  . Smoking status: Current Every Day Smoker -- 1.00 packs/day for 11 years    Types: Cigarettes  . Smokeless tobacco: Not on file     Comment: Smokes one pack of cigarettes daily  . Alcohol Use: Yes     Comment: occasionally   OB History    No data available     Review of Systems  Constitutional: Positive for fever and chills. Negative for appetite change and fatigue.  HENT: Positive for sore throat. Negative for congestion, ear discharge and sinus pressure.   Eyes: Negative for discharge.  Respiratory: Positive for cough.   Cardiovascular: Negative for chest pain.  Gastrointestinal: Positive for nausea and vomiting. Negative for abdominal pain and diarrhea.  Genitourinary: Negative for frequency and hematuria.  Musculoskeletal: Negative for back pain.  Skin: Negative for rash.  Neurological: Negative for seizures and headaches.  Psychiatric/Behavioral: Negative for hallucinations.      Allergies  Codeine; Hydromorphone hcl; Nsaids; Penicillins; Bentyl; Latex; and Tape  Home Medications   Prior to Admission medications   Medication Sig Start Date End Date Taking? Authorizing Provider  dexlansoprazole (DEXILANT) 60 MG capsule Take 60 mg by mouth daily.   Yes Historical Provider, MD  DM-Doxylamine-Acetaminophen 15-6.25-325 MG/15ML LIQD Take 15-30 mLs by mouth daily as needed (for congestion/cold).   Yes Historical Provider, MD  meloxicam (MOBIC) 15 MG tablet Take 15 mg by mouth daily.   Yes Historical Provider, MD  PARoxetine (PAXIL) 10 MG tablet Take 10 mg by mouth daily.   Yes Historical Provider, MD  acetaminophen (TYLENOL) 500 MG tablet Take 1,000 mg by mouth every 6 (six) hours as needed for moderate pain.    Historical  Provider, MD  cyclobenzaprine (FLEXERIL) 5 MG tablet Take 1 tablet (5 mg total) by mouth 3 (three) times daily as needed. Patient not taking: Reported on 01/23/2014 12/26/13   [name], PA-C  HYDROcodone-acetaminophen (NORCO/VICODIN) 5-325 MG per tablet Take 1 tablet by mouth every 4 (four) hours as needed. Patient not taking: Reported on 01/23/2014 12/26/13   [name], PA-C   BP 124/82 mmHg  Pulse 107  Temp(Src) 98.6 F (37 C) (Oral)  Resp 18  Ht 5' 7.25" (1.708 m)  Wt 220 lb (99.791 kg)  BMI 34.21 kg/m2  SpO2 100% Physical Exam  Constitutional: She is oriented to person, place, and time. She appears well-developed.  HENT:  Head: Normocephalic.  Eyes: Conjunctivae and EOM are normal. No scleral icterus.  Neck: Neck supple. No thyromegaly present.  Cardiovascular: Normal rate and regular rhythm.  Exam reveals no gallop and no friction rub.   No murmur heard. Pulmonary/Chest: No stridor. She has wheezes. She has no rales. She exhibits no tenderness.  Mild wheezing bilateral   Abdominal: She exhibits no distension. There is no tenderness. There is no rebound.  Musculoskeletal: Normal range of motion. She exhibits no edema.  Lymphadenopathy:    She has no cervical adenopathy.  Neurological: She is oriented to person, place, and time. She exhibits normal muscle tone. Coordination normal.  Skin: No rash noted. No erythema.  Psychiatric: She has a normal mood and affect. Her behavior is normal.    ED Course  Procedures (including critical care time) DIAGNOSTIC STUDIES: Oxygen Saturation is 100% on RA, normal by my interpretation.    COORDINATION OF CARE: 10:11 PM- Pt advised of plan for treatment and pt agrees.   Labs Review Labs Reviewed - No data to display  Imaging Review No results found.   EKG Interpretation None      MDM   Final diagnoses:  None    Sinusitis   I personally performed the services described in this documentation, which was scribed in my  presence. The recorded information has been reviewed and is accurate.    Patricia Diego, MD 01/23/14 [place], MD 01/23/14 [phone]

## 2014-01-23 NOTE — Discharge Instructions (Signed)
Follow up next week if not improving. °

## 2014-01-23 NOTE — ED Notes (Addendum)
Drinking fluids well, no nausea, no diarrhea

## 2014-01-23 NOTE — ED Notes (Signed)
Pt c/o n/v/d, emesis, sore throat and generalized body aches since last night.

## 2014-02-27 ENCOUNTER — Telehealth: Payer: Self-pay | Admitting: Gastroenterology

## 2014-02-27 NOTE — Telephone Encounter (Signed)
SX STARTED TODAY-DIARRHEA, VOMITING(ALL DAY), ABD PAIN ALL OVER HER STOMACH. TRIED PEPTO BISMOL. NO PHENERGAN. TRIED PHENERGAN & NO HELP. PT ADVISED TO GOT ED IF SHE IS UNABLE TO KEEP ANYTHING DOWN OR GET RELIEF FROM HER HOME MEDS. SHE SAYS SHE ALLERGIC TO TYLENOL.

## 2014-02-28 ENCOUNTER — Telehealth: Payer: Self-pay

## 2014-02-28 NOTE — Telephone Encounter (Signed)
Pt is a RMR pt.

## 2014-02-28 NOTE — Telephone Encounter (Signed)
noted 

## 2014-02-28 NOTE — Telephone Encounter (Signed)
Pt called stating her pain is still not better. Advised that she should go to ER per Dr. Oneida Alar note from 02/27/2014. Vomiting has stopped.

## 2014-02-28 NOTE — Telephone Encounter (Signed)
Noted  

## 2014-03-04 ENCOUNTER — Encounter (HOSPITAL_COMMUNITY): Payer: Self-pay | Admitting: Emergency Medicine

## 2014-03-04 ENCOUNTER — Emergency Department (HOSPITAL_COMMUNITY)
Admission: EM | Admit: 2014-03-04 | Discharge: 2014-03-04 | Disposition: A | Discharge status: Home / Self Care | Payer: Self-pay | Attending: Emergency Medicine | Admitting: Emergency Medicine

## 2014-03-04 DIAGNOSIS — Z91040 Latex allergy status: Secondary | ICD-10-CM | POA: Insufficient documentation

## 2014-03-04 DIAGNOSIS — Z86018 Personal history of other benign neoplasm: Secondary | ICD-10-CM | POA: Insufficient documentation

## 2014-03-04 DIAGNOSIS — Z791 Long term (current) use of non-steroidal anti-inflammatories (NSAID): Secondary | ICD-10-CM | POA: Insufficient documentation

## 2014-03-04 DIAGNOSIS — R197 Diarrhea, unspecified: Secondary | ICD-10-CM | POA: Insufficient documentation

## 2014-03-04 DIAGNOSIS — K219 Gastro-esophageal reflux disease without esophagitis: Secondary | ICD-10-CM | POA: Insufficient documentation

## 2014-03-04 DIAGNOSIS — Z79899 Other long term (current) drug therapy: Secondary | ICD-10-CM | POA: Insufficient documentation

## 2014-03-04 DIAGNOSIS — Z87828 Personal history of other (healed) physical injury and trauma: Secondary | ICD-10-CM | POA: Insufficient documentation

## 2014-03-04 DIAGNOSIS — Z9889 Other specified postprocedural states: Secondary | ICD-10-CM | POA: Insufficient documentation

## 2014-03-04 DIAGNOSIS — R112 Nausea with vomiting, unspecified: Secondary | ICD-10-CM | POA: Insufficient documentation

## 2014-03-04 DIAGNOSIS — Z720 Tobacco use: Secondary | ICD-10-CM | POA: Insufficient documentation

## 2014-03-04 DIAGNOSIS — Z85038 Personal history of other malignant neoplasm of large intestine: Secondary | ICD-10-CM | POA: Insufficient documentation

## 2014-03-04 DIAGNOSIS — R1084 Generalized abdominal pain: Secondary | ICD-10-CM | POA: Insufficient documentation

## 2014-03-04 DIAGNOSIS — Z3202 Encounter for pregnancy test, result negative: Secondary | ICD-10-CM | POA: Insufficient documentation

## 2014-03-04 DIAGNOSIS — Z86010 Personal history of colonic polyps: Secondary | ICD-10-CM | POA: Insufficient documentation

## 2014-03-04 LAB — COMPREHENSIVE METABOLIC PANEL
ALBUMIN: 3.9 g/dL (ref 3.5–5.2)
ALT: 16 U/L (ref 0–35)
AST: 17 U/L (ref 0–37)
Alkaline Phosphatase: 103 U/L (ref 39–117)
Anion gap: 14 (ref 5–15)
BILIRUBIN TOTAL: 0.5 mg/dL (ref 0.3–1.2)
BUN: 11 mg/dL (ref 6–23)
CALCIUM: 9.5 mg/dL (ref 8.4–10.5)
CHLORIDE: 104 meq/L (ref 96–112)
CO2: 24 meq/L (ref 19–32)
Creatinine, Ser: 0.75 mg/dL (ref 0.50–1.10)
GFR calc Af Amer: >90 mL/min (ref >90)
GFR calc non Af Amer: >90 mL/min (ref >90)
Glucose, Bld: 105 mg/dL — ABNORMAL HIGH (ref 70–99)
Potassium: 4.1 mEq/L (ref 3.7–5.3)
Sodium: 142 mEq/L (ref 137–147)
Total Protein: 7.2 g/dL (ref 6.0–8.3)

## 2014-03-04 LAB — CBC WITH DIFFERENTIAL/PLATELET
BASOS ABS: 0 10*3/uL (ref 0.0–0.1)
Basophils Relative: 0 % (ref 0–1)
Eosinophils Absolute: 0.5 10*3/uL (ref 0.0–0.7)
Eosinophils Relative: 6 % — ABNORMAL HIGH (ref 0–5)
HCT: 44.9 % (ref 36.0–46.0)
Hemoglobin: 15.2 g/dL — ABNORMAL HIGH (ref 12.0–15.0)
LYMPHS PCT: 35 % (ref 12–46)
Lymphs Abs: 3 10*3/uL (ref 0.7–4.0)
MCH: 30.8 pg (ref 26.0–34.0)
MCHC: 33.9 g/dL (ref 30.0–36.0)
MCV: 91.1 fL (ref 78.0–100.0)
Monocytes Absolute: 0.4 10*3/uL (ref 0.1–1.0)
Monocytes Relative: 5 % (ref 3–12)
NEUTROS ABS: 4.7 10*3/uL (ref 1.7–7.7)
Neutrophils Relative %: 54 % (ref 43–77)
PLATELETS: 210 10*3/uL (ref 150–400)
RBC: 4.93 MIL/uL (ref 3.87–5.11)
RDW: 12.4 % (ref 11.5–15.5)
WBC: 8.5 10*3/uL (ref 4.0–10.5)

## 2014-03-04 LAB — URINE MICROSCOPIC-ADD ON

## 2014-03-04 LAB — URINALYSIS, ROUTINE W REFLEX MICROSCOPIC
Bilirubin Urine: NEGATIVE
GLUCOSE, UA: NEGATIVE mg/dL
Hgb urine dipstick: NEGATIVE
Ketones, ur: NEGATIVE mg/dL
LEUKOCYTES UA: NEGATIVE
Nitrite: POSITIVE — AB
Protein, ur: NEGATIVE mg/dL
Specific Gravity, Urine: >1.03 — ABNORMAL HIGH (ref 1.005–1.030)
Urobilinogen, UA: 0.2 mg/dL (ref 0.0–1.0)
pH: 5 (ref 5.0–8.0)

## 2014-03-04 LAB — PREGNANCY, URINE: PREG TEST UR: NEGATIVE

## 2014-03-04 MED ORDER — SODIUM CHLORIDE 0.9 % IV BOLUS (SEPSIS)
1000.0000 mL | Freq: Once | INTRAVENOUS | Status: AC
  Administered 2014-03-04: 1000 mL via INTRAVENOUS

## 2014-03-04 MED ORDER — MORPHINE SULFATE 4 MG/ML IJ SOLN
6.0000 mg | Freq: Once | INTRAMUSCULAR | Status: AC
  Administered 2014-03-04: 6 mg via INTRAVENOUS
  Filled 2014-03-04: qty 2

## 2014-03-04 MED ORDER — ONDANSETRON HCL 4 MG/2ML IJ SOLN
4.0000 mg | Freq: Once | INTRAMUSCULAR | Status: AC
  Administered 2014-03-04: 4 mg via INTRAVENOUS
  Filled 2014-03-04: qty 2

## 2014-03-04 NOTE — ED Notes (Signed)
Pt reports v/d and abdominal cramping since 230 this am. nad noted.

## 2014-03-04 NOTE — Discharge Instructions (Signed)
Follow-up closely with your gastroenterologist  If you were given medicines take as directed.  If you are on coumadin or contraceptives realize their levels and effectiveness is altered by many different medicines.  If you have any reaction (rash, tongues swelling, other) to the medicines stop taking and see a physician.   Please follow up as directed and return to the ER or see a physician for new or worsening symptoms.  Thank you. Filed Vitals:   03/04/14 1714  BP: 127/89  Pulse: 101  Temp: 98 F (36.7 C)  TempSrc: Oral  Resp: 20  Height: 5\' 7"  (1.702 m)  Weight: 216 lb (97.977 kg)  SpO2: 99%

## 2014-03-04 NOTE — Telephone Encounter (Signed)
Noted  

## 2014-03-04 NOTE — ED Provider Notes (Signed)
CSN: [id number]     Arrival date & time 03/04/14  1709 History   First MD Initiated Contact with Patient 03/04/14 1916     This chart was scribed for No att. providers found by Forrestine Him, ED Scribe. This patient was seen in room APA05/APA05 and the patient's care was started 1:47 AM.   Chief Complaint  Patient presents with  . Abdominal Pain   The history is provided by the patient. No language interpreter was used.    HPI Comments: Patricia Rivers is a 28 y.o. female with a PMHx of GERD who presents to the Emergency Department complaining of constant, moderate abdominal pain onset 2:30 AM this morning. Pain is described as cramping. Pt also reports vomiting and diarrhea since onset of abdominal pain. She is unable to keep any solid foods down at this time. Pt admits to a history of abdominal pain but states current episode is worsened. She is followed by a GI doctor for ongoing abdominal issues and was started on Dexilant which did not provide any relief for today's discomfort. No new foods of medications. No known sick contacts. Last colonoscopy July of this year.  Past Medical History  Diagnosis Date  . Right ankle injury   . Colon polyp   . Rectal polyp   . Carcinoid tumor of rectum   . 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 GERD (gastroesophageal reflux disease)    Past Surgical History  Procedure Laterality Date  . Colonoscopy with esophagogastroduodenoscopy (egd) N/A 05/16/2013    Dr. Gala Romney: rectal polyp with low grade carcinoid tumor, colon polyp with tubulovillous adenoma with high grade dysplasia. Needs repeat colonoscopy in 3 months. EGD with antral erosions, duodenal bulb erosions, superficial ulceration. path with mild chronic gastritis   . Colonoscopy with propofol N/A 09/06/2013    [id number] ileocolonoscopy. No evidence  of residual polyp anywhere. Next TCS 08/2016.   Family History  Problem Relation Age of Onset  . Colon cancer Maternal  Grandmother   . Cervical cancer Mother   . Asthma    . Diabetes     History  Substance Use Topics  . Smoking status: Current Every Day Smoker -- 1.00 packs/day for 11 years    Types: Cigarettes  . Smokeless tobacco: Not on file     Comment: Smokes one pack of cigarettes daily  . Alcohol Use: Yes     Comment: occasionally   OB History    No data available     Review of Systems  Constitutional: Negative for fever and chills.  Respiratory: Negative for shortness of breath.   Gastrointestinal: Positive for vomiting, abdominal pain and diarrhea.  All other systems reviewed and are negative.     Allergies  Codeine; Erythromycin; Hydromorphone hcl; Nsaids; Penicillins; Bentyl; Latex; and Tape  Home Medications   Prior to Admission medications   Medication Sig Start Date End Date Taking? Authorizing Provider  acetaminophen (TYLENOL) 500 MG tablet Take 1,000 mg by mouth every 6 (six) hours as needed for moderate pain.    Historical Provider, MD  azithromycin (ZITHROMAX) 250 MG tablet Take one tablet a day 01/23/14   Maudry Diego, MD  cyclobenzaprine (FLEXERIL) 5 MG tablet Take 1 tablet (5 mg total) by mouth 3 (three) times daily as needed. Patient not taking: Reported on 01/23/2014 12/26/13   [name], PA-C  dexlansoprazole (DEXILANT) 60 MG capsule Take 60 mg by mouth daily.    Historical Provider, MD  DM-Doxylamine-Acetaminophen 15-6.25-325 MG/15ML LIQD Take 15-30 mLs by mouth daily as needed (for congestion/cold).    Historical Provider, MD  HYDROcodone-acetaminophen (NORCO/VICODIN) 5-325 MG per tablet Take 1 tablet by mouth every 6 (six) hours as needed. 01/23/14   Maudry Diego, MD  meloxicam (MOBIC) 15 MG tablet Take 15 mg by mouth daily.    Historical Provider, MD  PARoxetine (PAXIL) 10 MG tablet Take 10 mg by mouth daily.    Historical Provider, MD   Triage Vitals: BP 127/89 mmHg  Pulse 101  Temp(Src) 98 F (36.7 C) (Oral)  Resp 20  Ht 5\' 7"  (1.702 m)  Wt 216 lb  (97.977 kg)  BMI 33.82 kg/m2  SpO2 99%   Physical Exam  Constitutional: She is oriented to person, place, and time. She appears well-developed and well-nourished. No distress.  HENT:  Head: Normocephalic and atraumatic.  Eyes: EOM are normal. No scleral icterus.  Neck: Normal range of motion. Neck supple.  Cardiovascular: Regular rhythm and normal heart sounds.   Pulmonary/Chest: Effort normal and breath sounds normal.  Abdominal: Soft. She exhibits no distension. There is no tenderness. There is no rebound and no guarding.  Diffuse abdominal tenderness  Musculoskeletal: Normal range of motion.  Neurological: She is alert and oriented to person, place, and time.  Skin: Skin is warm and dry.  Psychiatric: She has a normal mood and affect. Judgment normal.  Nursing note and vitals reviewed.   ED Course  Procedures (including critical care time)  EMERGENCY DEPARTMENT BILIARY ULTRASOUND INTERPRETATION "Study: Limited Abdominal Ultrasound of the gallbladder and common bile duct."  INDICATIONS: Abdominal pain and Nausea Indication: Multiple views of the gallbladder and common bile duct were obtained in real-time with a Multi-frequency probe." PERFORMED BY:  Myself IMAGES ARCHIVED?: Yes FINDINGS: Gallstones absent, Gallbladder wall normal in thickness and Sonographic Murphy's sign absent LIMITATIONS: Body Habitus and Bowel Gas INTERPRETATION: Normal possible mild sludge   DIAGNOSTIC STUDIES: Oxygen Saturation is 99% on RA, Normal by my interpretation.    COORDINATION OF CARE: 1:47 AM-Discussed treatment plan with pt at bedside and pt agreed to plan.     Labs Review Labs Reviewed  CBC WITH DIFFERENTIAL - Abnormal; Notable for the following:    Hemoglobin 15.2 (*)    Eosinophils Relative 6 (*)    All other components within normal limits  COMPREHENSIVE METABOLIC PANEL - Abnormal; Notable for the following:    Glucose, Bld 105 (*)    All other components within normal limits   URINALYSIS, ROUTINE W REFLEX MICROSCOPIC - Abnormal; Notable for the following:    Specific Gravity, Urine >1.030 (*)    Nitrite POSITIVE (*)    All other components within normal limits  URINE MICROSCOPIC-ADD ON - Abnormal; Notable for the following:    Squamous Epithelial / LPF MANY (*)    Bacteria, UA MANY (*)    All other components within normal limits  PREGNANCY, URINE    Imaging Review No results found.   EKG Interpretation None      MDM   Final diagnoses:  Abdominal pain, diffuse  Non-intractable vomiting with nausea, vomiting of unspecified type    I personally performed the services described in this documentation, which was scribed in my presence. The recorded information has been reviewed and is accurate.   Patient improved in the ER and recheck. Patient has close follow-up outpatient with GI and this is similar to multiple previous episodes. Results and differential diagnosis were discussed with the patient/parent/guardian. Close follow up  outpatient was discussed, comfortable with the plan.   Medications  sodium chloride 0.9 % bolus 1,000 mL (0 mLs Intravenous Stopped 03/04/14 2024)  ondansetron (ZOFRAN) injection 4 mg (4 mg Intravenous Given 03/04/14 1949)  morphine 4 MG/ML injection 6 mg (6 mg Intravenous Given 03/04/14 1949)    Filed Vitals:   03/04/14 1714  BP: 127/89  Pulse: 101  Temp: 98 F (36.7 C)  TempSrc: Oral  Resp: 20  Height: 5\' 7"  (1.702 m)  Weight: 216 lb (97.977 kg)  SpO2: 99%    Final diagnoses:  Abdominal pain, diffuse  Non-intractable vomiting with nausea, vomiting of unspecified type      Mariea Clonts, MD 03/05/14 [phone]

## 2014-03-12 ENCOUNTER — Encounter: Payer: Self-pay | Admitting: Internal Medicine

## 2014-03-25 ENCOUNTER — Encounter: Payer: Self-pay | Admitting: Gastroenterology

## 2014-03-25 ENCOUNTER — Telehealth: Payer: Self-pay | Admitting: Internal Medicine

## 2014-03-25 ENCOUNTER — Ambulatory Visit (INDEPENDENT_AMBULATORY_CARE_PROVIDER_SITE_OTHER): Payer: Self-pay | Admitting: Gastroenterology

## 2014-03-25 VITALS — BP 124/85 | HR 67 | Temp 98.2°F | Ht 66.0 in | Wt 223.8 lb

## 2014-03-25 DIAGNOSIS — K529 Noninfective gastroenteritis and colitis, unspecified: Secondary | ICD-10-CM

## 2014-03-25 DIAGNOSIS — R1011 Right upper quadrant pain: Secondary | ICD-10-CM

## 2014-03-25 MED ORDER — ELUXADOLINE 100 MG PO TABS
1.0000 | ORAL_TABLET | Freq: Two times a day (BID) | ORAL | Status: DC
Start: 2014-03-25 — End: 2014-06-19

## 2014-03-25 NOTE — Progress Notes (Signed)
Referring Provider: No ref. provider found Primary Care Physician:  No PCP Per Patient Primary GI: Dr. Gala Romney   Chief Complaint  Patient presents with  . Abdominal Pain  . Nausea    HPI:   Patricia Rivers is a 29 y.o. female presenting today with a history of  a small carcinoid in the rectum and a tubulovillous adenoma with high-grade dysplasia removed from the ascending colon earlier this year. Followup colonoscopy demonstrated no evidence of residual lesion anywhere. She is slated to have another colonoscopy in 3 years. Urinary 5 HIAA normal. Last seen Oct 2015 by Dr. Gala Romney. Dealing with IBS-D.    Never got Librax filled. Librax too expensive. Up all night with loose stools. Constant loose stools during day. Right-sided abdominal discomfort starting 2 weeks ago. Nocturnal symptoms predominantly. Some pain during the day. At night worsens. Hard to sleep at night. Pain worsened with eating/drinking. Staying off of sodas, tea. Staying on gatorade and water.  Some vomiting at night but not constant.   Past Medical History  Diagnosis Date  . Right ankle injury   . Colon polyp   . Rectal polyp   . Carcinoid tumor of rectum   . 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 GERD (gastroesophageal reflux disease)     Past Surgical History  Procedure Laterality Date  . Colonoscopy with esophagogastroduodenoscopy (egd) N/A 05/16/2013    Dr. Gala Romney: rectal polyp with low grade carcinoid tumor, colon polyp with tubulovillous adenoma with high grade dysplasia. Needs repeat colonoscopy in 3 months. EGD with antral erosions, duodenal bulb erosions, superficial ulceration. path with mild chronic gastritis   . Colonoscopy with propofol N/A 09/06/2013    [id number]:[id number] ileocolonoscopy. No evidence  of residual polyp anywhere. Next TCS 08/2016.    Current Outpatient Prescriptions  Medication Sig Dispense Refill  . acetaminophen (TYLENOL) 500 MG tablet Take 1,000 mg by mouth every 6  (six) hours as needed for moderate pain.    [name] azithromycin (ZITHROMAX) 250 MG tablet Take one tablet a day 4 tablet 0  . cyclobenzaprine (FLEXERIL) 5 MG tablet Take 1 tablet (5 mg total) by mouth 3 (three) times daily as needed. 15 tablet 0  . dexlansoprazole (DEXILANT) 60 MG capsule Take 60 mg by mouth daily.    [name] DM-Doxylamine-Acetaminophen 15-6.25-325 MG/15ML LIQD Take 15-30 mLs by mouth daily as needed (for congestion/cold).    [name] HYDROcodone-acetaminophen (NORCO/VICODIN) 5-325 MG per tablet Take 1 tablet by mouth every 6 (six) hours as needed. 20 tablet 0  . meloxicam (MOBIC) 15 MG tablet Take 15 mg by mouth daily.    [name] PARoxetine (PAXIL) 10 MG tablet Take 10 mg by mouth daily.     No current facility-administered medications for this visit.    Allergies as of 03/25/2014 - Review Complete 03/25/2014  Allergen Reaction Noted  . Codeine Nausea And Vomiting 12/15/2010  . Erythromycin  03/04/2014  . Hydromorphone hcl Nausea And Vomiting 12/15/2010  . Nsaids  05/20/2013  . Penicillins Hives 12/15/2010  . Bentyl [dicyclomine hcl] Rash 08/22/2013  . Latex Rash 05/10/2013  . Tape Itching and Rash 05/10/2013    Family History  Problem Relation Age of Onset  . Colon cancer Maternal Grandmother   . Cervical cancer Mother   . Asthma    . Diabetes      History   Social History  . Marital Status: Married    Spouse Name: N/A    Number of Children:  N/A  . Years of Education: N/A   Social History Main Topics  . Smoking status: Current Every Day Smoker -- 1.00 packs/day for 11 years    Types: Cigarettes  . Smokeless tobacco: None     Comment: Smokes one pack of cigarettes daily  . Alcohol Use: Yes     Comment: occasionally  . Drug Use: No  . Sexual Activity: Yes    Birth Control/ Protection: None   Other Topics Concern  . None   Social History Narrative    Review of Systems: As noted above  Physical Exam: BP 124/85 mmHg  Pulse 67  Temp(Src) 98.2 F (36.8 C) (Oral)   Ht 5\' 6"  (1.676 m)  Wt 223 lb 12.8 oz (101.515 kg)  BMI 36.14 kg/m2 General:   Alert and oriented. No distress noted. Pleasant and cooperative.  Head:  Normocephalic and atraumatic. Eyes:  Conjuctiva clear without scleral icterus. Mouth:  Oral mucosa pink and moist. Good dentition. No lesions. Abdomen:  +BS, soft, mild TTP RUQ  and non-distended. No rebound or guarding. No HSM or masses noted. Msk:  Symmetrical without gross deformities. Normal posture. Extremities:  Without edema. Neurologic:  Alert and  oriented x4;  grossly normal neurologically. Skin:  Intact without significant lesions or rashes. Psych:  Alert and cooperative. Normal mood and affect.  03/04/14 US ABDOMEN IN ED:  INDICATIONS: Abdominal pain and Nausea Indication: Multiple views of the gallbladder and common bile duct were obtained in real-time with a Multi-frequency probe." PERFORMED BY: Myself IMAGES ARCHIVED?: Yes FINDINGS: Gallstones absent, Gallbladder wall normal in thickness and Sonographic Murphy's sign absent LIMITATIONS: Body Habitus and Bowel Gas INTERPRETATION: Normal possible mild sludge  HIDA scan in Feb 2015: normal EF of 77.6%. However, noted pain with CCK infusion.   Lab Results  Component Value Date   WBC 8.5 03/04/2014   HGB 15.2* 03/04/2014   HCT 44.9 03/04/2014   MCV 91.1 03/04/2014   PLT 210 03/04/2014   Lab Results  Component Value Date   ALT 16 03/04/2014   AST 17 03/04/2014   ALKPHOS 103 03/04/2014   BILITOT 0.5 03/04/2014   Lab Results  Component Value Date   CREATININE 0.75 03/04/2014   BUN 11 03/04/2014   NA 142 03/04/2014   K 4.1 03/04/2014   CL 104 03/04/2014   CO2 24 03/04/2014

## 2014-03-25 NOTE — Assessment & Plan Note (Signed)
Acute on chronic RUQ pain without concerning findings on exam or recent labs/ultrasound abdomen. HIDA last year with normal EF but reproduction of pain. EGD up-to-date. Likely chronic abdominal pain but unable to exclude evolving biliary dyskinesia. Update HIDA now. May need elective referral to general surgery.

## 2014-03-25 NOTE — Telephone Encounter (Signed)
Pt is aware. Samples are at the front desk.  

## 2014-03-25 NOTE — Telephone Encounter (Signed)
Pt seen [facility] today and called this afternoon saying that the card that was given to her for a prescription said that she's not eligible and it would cost her $1000 to get. Please advise and call her at [phone] or [phone] she uses [facility]

## 2014-03-25 NOTE — Telephone Encounter (Signed)
Let's just provide one month samples. IF she finds this works for her, have her contact us in the next few weeks, and we can attempt patient assistance.

## 2014-03-25 NOTE — Patient Instructions (Signed)
We have scheduled you for a HIDA scan to see if anything has changed.   Start taking Viberzi tablets with food twice a day for diarrhea. I have provided a voucher.  Further recommendations to follow.

## 2014-03-25 NOTE — Assessment & Plan Note (Signed)
Colonoscopy up-to-date. Unable to afford many medications due to lack of insurance. Will trial Viberzi 100 mg po BID.  One month sample supply provided. If this works for patient, will attempt patient assistance.

## 2014-03-26 ENCOUNTER — Encounter (HOSPITAL_COMMUNITY): Payer: Self-pay

## 2014-03-26 ENCOUNTER — Encounter (HOSPITAL_COMMUNITY)
Admission: RE | Admit: 2014-03-26 | Discharge: 2014-03-26 | Disposition: A | Discharge status: Home / Self Care | Payer: Self-pay | Source: Ambulatory Visit | Attending: Gastroenterology | Admitting: Gastroenterology

## 2014-03-26 DIAGNOSIS — R1110 Vomiting, unspecified: Secondary | ICD-10-CM | POA: Insufficient documentation

## 2014-03-26 DIAGNOSIS — R197 Diarrhea, unspecified: Secondary | ICD-10-CM | POA: Insufficient documentation

## 2014-03-26 DIAGNOSIS — R1011 Right upper quadrant pain: Secondary | ICD-10-CM

## 2014-03-26 DIAGNOSIS — R109 Unspecified abdominal pain: Secondary | ICD-10-CM | POA: Insufficient documentation

## 2014-03-26 MED ORDER — STERILE WATER FOR INJECTION IJ SOLN
INTRAMUSCULAR | Status: AC
  Administered 2014-03-26: 2.03 mL via INTRAVENOUS
  Filled 2014-03-26: qty 10

## 2014-03-26 MED ORDER — SODIUM CHLORIDE 0.9 % IJ SOLN
INTRAMUSCULAR | Status: AC
  Filled 2014-03-26: qty 30

## 2014-03-26 MED ORDER — SINCALIDE 5 MCG IJ SOLR
INTRAMUSCULAR | Status: AC
  Administered 2014-03-26: 2.03 ug via INTRAVENOUS
  Filled 2014-03-26: qty 5

## 2014-03-26 MED ORDER — SODIUM CHLORIDE 0.9 % IJ SOLN
INTRAMUSCULAR | Status: AC
  Filled 2014-03-26: qty 3

## 2014-03-26 MED ORDER — TECHNETIUM TC 99M MEBROFENIN IV KIT
5.0000 | PACK | Freq: Once | INTRAVENOUS | Status: AC | PRN
  Administered 2014-03-26: 5 via INTRAVENOUS

## 2014-03-26 NOTE — Progress Notes (Signed)
No pcp per patient 

## 2014-03-26 NOTE — Progress Notes (Signed)
Quick Note:  HIDA last year had an EF of 77%. She has had a documented reduction in EF to now 24% along with abdominal pain during CCK administration. Question mild biliary dyskinesia. Please arrange referral to Dr. Arnoldo Morale.     ______

## 2014-03-27 ENCOUNTER — Other Ambulatory Visit: Payer: Self-pay

## 2014-03-27 DIAGNOSIS — K828 Other specified diseases of gallbladder: Secondary | ICD-10-CM

## 2014-03-27 MED ORDER — SODIUM CHLORIDE 0.9 % IJ SOLN
INTRAMUSCULAR | Status: AC
  Filled 2014-03-27: qty 30

## 2014-03-29 ENCOUNTER — Emergency Department (HOSPITAL_COMMUNITY): Payer: Self-pay

## 2014-03-29 ENCOUNTER — Telehealth: Payer: Self-pay | Admitting: Nurse Practitioner

## 2014-03-29 ENCOUNTER — Other Ambulatory Visit: Payer: Self-pay | Admitting: Internal Medicine

## 2014-03-29 ENCOUNTER — Encounter (HOSPITAL_COMMUNITY): Payer: Self-pay | Admitting: *Deleted

## 2014-03-29 ENCOUNTER — Emergency Department (HOSPITAL_COMMUNITY)
Admission: EM | Admit: 2014-03-29 | Discharge: 2014-03-29 | Disposition: A | Discharge status: Home / Self Care | Payer: Self-pay | Attending: Emergency Medicine | Admitting: Emergency Medicine

## 2014-03-29 DIAGNOSIS — R112 Nausea with vomiting, unspecified: Secondary | ICD-10-CM | POA: Insufficient documentation

## 2014-03-29 DIAGNOSIS — Z86010 Personal history of colonic polyps: Secondary | ICD-10-CM | POA: Insufficient documentation

## 2014-03-29 DIAGNOSIS — K219 Gastro-esophageal reflux disease without esophagitis: Secondary | ICD-10-CM | POA: Insufficient documentation

## 2014-03-29 DIAGNOSIS — R1011 Right upper quadrant pain: Secondary | ICD-10-CM | POA: Insufficient documentation

## 2014-03-29 DIAGNOSIS — Z720 Tobacco use: Secondary | ICD-10-CM | POA: Insufficient documentation

## 2014-03-29 DIAGNOSIS — Z91040 Latex allergy status: Secondary | ICD-10-CM | POA: Insufficient documentation

## 2014-03-29 DIAGNOSIS — Z880 Allergy status to penicillin: Secondary | ICD-10-CM | POA: Insufficient documentation

## 2014-03-29 DIAGNOSIS — Z79899 Other long term (current) drug therapy: Secondary | ICD-10-CM | POA: Insufficient documentation

## 2014-03-29 DIAGNOSIS — R51 Headache: Secondary | ICD-10-CM | POA: Insufficient documentation

## 2014-03-29 DIAGNOSIS — Z791 Long term (current) use of non-steroidal anti-inflammatories (NSAID): Secondary | ICD-10-CM | POA: Insufficient documentation

## 2014-03-29 LAB — COMPREHENSIVE METABOLIC PANEL
ALBUMIN: 4.2 g/dL (ref 3.5–5.2)
ALK PHOS: 75 U/L (ref 39–117)
ALT: 26 U/L (ref 0–35)
AST: 18 U/L (ref 0–37)
Anion gap: 6 (ref 5–15)
BUN: 13 mg/dL (ref 6–23)
CALCIUM: 9.5 mg/dL (ref 8.4–10.5)
CO2: 26 mmol/L (ref 19–32)
CREATININE: 0.7 mg/dL (ref 0.50–1.10)
Chloride: 106 mEq/L (ref 96–112)
GFR calc non Af Amer: >90 mL/min (ref >90)
Glucose, Bld: 91 mg/dL (ref 70–99)
Potassium: 4.2 mmol/L (ref 3.5–5.1)
Sodium: 138 mmol/L (ref 135–145)
Total Bilirubin: 0.5 mg/dL (ref 0.3–1.2)
Total Protein: 7.1 g/dL (ref 6.0–8.3)

## 2014-03-29 LAB — CBC WITH DIFFERENTIAL/PLATELET
BASOS PCT: 0 % (ref 0–1)
Basophils Absolute: 0 10*3/uL (ref 0.0–0.1)
EOS PCT: 4 % (ref 0–5)
Eosinophils Absolute: 0.3 10*3/uL (ref 0.0–0.7)
HCT: 43.6 % (ref 36.0–46.0)
Hemoglobin: 14.4 g/dL (ref 12.0–15.0)
LYMPHS ABS: 2.9 10*3/uL (ref 0.7–4.0)
LYMPHS PCT: 33 % (ref 12–46)
MCH: 30.1 pg (ref 26.0–34.0)
MCHC: 33 g/dL (ref 30.0–36.0)
MCV: 91 fL (ref 78.0–100.0)
MONO ABS: 0.4 10*3/uL (ref 0.1–1.0)
MONOS PCT: 5 % (ref 3–12)
NEUTROS ABS: 5.1 10*3/uL (ref 1.7–7.7)
Neutrophils Relative %: 58 % (ref 43–77)
PLATELETS: 186 10*3/uL (ref 150–400)
RBC: 4.79 MIL/uL (ref 3.87–5.11)
RDW: 12.5 % (ref 11.5–15.5)
WBC: 8.8 10*3/uL (ref 4.0–10.5)

## 2014-03-29 LAB — LIPASE, BLOOD: LIPASE: 29 U/L (ref 11–59)

## 2014-03-29 MED ORDER — HYDROMORPHONE HCL 1 MG/ML IJ SOLN
1.0000 mg | Freq: Once | INTRAMUSCULAR | Status: AC
  Administered 2014-03-29: 1 mg via INTRAVENOUS
  Filled 2014-03-29: qty 1

## 2014-03-29 MED ORDER — ONDANSETRON HCL 4 MG/2ML IJ SOLN
4.0000 mg | Freq: Once | INTRAMUSCULAR | Status: AC
  Administered 2014-03-29: 4 mg via INTRAVENOUS
  Filled 2014-03-29: qty 2

## 2014-03-29 MED ORDER — SODIUM CHLORIDE 0.9 % IV BOLUS (SEPSIS)
1000.0000 mL | Freq: Once | INTRAVENOUS | Status: AC
  Administered 2014-03-29: 1000 mL via INTRAVENOUS

## 2014-03-29 MED ORDER — SODIUM CHLORIDE 0.9 % IV SOLN: INTRAVENOUS | Status: DC

## 2014-03-29 MED ORDER — HYDROCODONE-ACETAMINOPHEN 5-325 MG PO TABS: 1.0000 | ORAL_TABLET | Freq: Four times a day (QID) | ORAL | Status: DC | PRN

## 2014-03-29 NOTE — Telephone Encounter (Signed)
Phone call from patient to hospital switchboard stating she had an abnormal HIDA scan and is supposed to see surgery on Monday to evaluate for possible cholecyctectomy. Is now having sever abdominal pain and unable to tolerate anything by mouth. Recommend proceed to the ER for further evaluation of sever abdominal pain and intolerance of po intake.

## 2014-03-29 NOTE — ED Notes (Signed)
Pt verbalizes she is being seen by a GI doctor to have her gall bladder removed.   Pt was tearful upon entering room. Nurse advised pt to take calming deep breaths. Pt is in NAD.

## 2014-03-29 NOTE — ED Notes (Signed)
MD at bedside. 

## 2014-03-29 NOTE — ED Notes (Signed)
Pt states she haqs had same abdominal pain for awhile and is to follow up with GI on Monday, pt tearful in triage, pt states she has had N/V/D for two days, BM is dark black in color per pt's family,

## 2014-03-29 NOTE — ED Notes (Signed)
Radiology made aware of Chest x-ray.
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
## 2014-03-29 NOTE — ED Notes (Signed)
Discharge instructions given, pt demonstrated teach back and verbal understanding. No concerns voiced.  

## 2014-03-29 NOTE — Discharge Instructions (Signed)
Follow-up with your GI Dr. in the referral information to general surgery provided. They will need to make a decision about removal of the gallbladder based on the hiatus scan results. Return for any new or worse symptoms. Take pain medicine as directed.

## 2014-04-04 ENCOUNTER — Ambulatory Visit: Payer: Self-pay | Admitting: Surgery

## 2014-04-17 ENCOUNTER — Ambulatory Visit: Payer: Self-pay | Admitting: Surgery

## 2014-04-17 LAB — CBC WITH DIFFERENTIAL/PLATELET
BASOS ABS: 0.1 10*3/uL (ref 0.0–0.1)
Basophil %: 0.7 %
Eosinophil #: 0.4 10*3/uL (ref 0.0–0.7)
Eosinophil %: 4.5 %
HCT: 45.4 % (ref 35.0–47.0)
HGB: 15.4 g/dL (ref 12.0–16.0)
Lymphocyte #: 2.9 10*3/uL (ref 1.0–3.6)
Lymphocyte %: 34.2 %
MCH: 30.6 pg (ref 26.0–34.0)
MCHC: 34 g/dL (ref 32.0–36.0)
MCV: 90 fL (ref 80–100)
MONOS PCT: 5.9 %
Monocyte #: 0.5 x10 3/mm (ref 0.2–0.9)
NEUTROS ABS: 4.6 10*3/uL (ref 1.4–6.5)
NEUTROS PCT: 54.7 %
PLATELETS: 182 10*3/uL (ref 150–440)
RBC: 5.03 10*6/uL (ref 3.80–5.20)
RDW: 12.6 % (ref 11.5–14.5)
WBC: 8.4 10*3/uL (ref 3.6–11.0)

## 2014-04-17 LAB — COMPREHENSIVE METABOLIC PANEL
ALBUMIN: 3.6 g/dL (ref 3.4–5.0)
ALK PHOS: 90 U/L (ref 46–116)
Anion Gap: 5 — ABNORMAL LOW (ref 7–16)
BUN: 11 mg/dL (ref 7–18)
Bilirubin,Total: 0.5 mg/dL (ref 0.2–1.0)
CALCIUM: 9.1 mg/dL (ref 8.5–10.1)
CREATININE: 0.71 mg/dL (ref 0.60–1.30)
Chloride: 109 mmol/L — ABNORMAL HIGH (ref 98–107)
Co2: 26 mmol/L (ref 21–32)
EGFR (African American): >60
EGFR (Non-African Amer.): >60
Glucose: 85 mg/dL (ref 65–99)
Osmolality: 278 (ref 275–301)
POTASSIUM: 4.1 mmol/L (ref 3.5–5.1)
SGOT(AST): 19 U/L (ref 15–37)
SGPT (ALT): 28 U/L (ref 14–63)
Sodium: 140 mmol/L (ref 136–145)
TOTAL PROTEIN: 7.2 g/dL (ref 6.4–8.2)

## 2014-05-08 ENCOUNTER — Ambulatory Visit: Payer: Self-pay | Admitting: Gastroenterology

## 2014-05-08 ENCOUNTER — Telehealth: Payer: Self-pay | Admitting: Gastroenterology

## 2014-05-08 ENCOUNTER — Encounter: Payer: Self-pay | Admitting: Gastroenterology

## 2014-05-08 NOTE — Telephone Encounter (Signed)
PATIENT WAS A NO SHOW 05/08/14 AND LETTER WAS SENT  °

## 2014-05-09 ENCOUNTER — Encounter (HOSPITAL_COMMUNITY): Payer: Self-pay

## 2014-05-09 ENCOUNTER — Emergency Department (HOSPITAL_COMMUNITY): Payer: Self-pay

## 2014-05-09 ENCOUNTER — Emergency Department (HOSPITAL_COMMUNITY)
Admission: EM | Admit: 2014-05-09 | Discharge: 2014-05-09 | Disposition: A | Discharge status: Home / Self Care | Payer: Self-pay | Attending: Emergency Medicine | Admitting: Emergency Medicine

## 2014-05-09 DIAGNOSIS — Z86010 Personal history of colonic polyps: Secondary | ICD-10-CM | POA: Insufficient documentation

## 2014-05-09 DIAGNOSIS — Z85040 Personal history of malignant carcinoid tumor of rectum: Secondary | ICD-10-CM | POA: Insufficient documentation

## 2014-05-09 DIAGNOSIS — Z3202 Encounter for pregnancy test, result negative: Secondary | ICD-10-CM | POA: Insufficient documentation

## 2014-05-09 DIAGNOSIS — Z91040 Latex allergy status: Secondary | ICD-10-CM | POA: Insufficient documentation

## 2014-05-09 DIAGNOSIS — G44219 Episodic tension-type headache, not intractable: Secondary | ICD-10-CM | POA: Insufficient documentation

## 2014-05-09 DIAGNOSIS — Z791 Long term (current) use of non-steroidal anti-inflammatories (NSAID): Secondary | ICD-10-CM | POA: Insufficient documentation

## 2014-05-09 DIAGNOSIS — Z880 Allergy status to penicillin: Secondary | ICD-10-CM | POA: Insufficient documentation

## 2014-05-09 DIAGNOSIS — Z8719 Personal history of other diseases of the digestive system: Secondary | ICD-10-CM | POA: Insufficient documentation

## 2014-05-09 DIAGNOSIS — Z79899 Other long term (current) drug therapy: Secondary | ICD-10-CM | POA: Insufficient documentation

## 2014-05-09 DIAGNOSIS — Z720 Tobacco use: Secondary | ICD-10-CM | POA: Insufficient documentation

## 2014-05-09 MED ORDER — DIPHENHYDRAMINE HCL 50 MG/ML IJ SOLN
50.0000 mg | Freq: Once | INTRAMUSCULAR | Status: AC
  Administered 2014-05-09: 50 mg via INTRAMUSCULAR
  Filled 2014-05-09: qty 1

## 2014-05-09 MED ORDER — DEXAMETHASONE SODIUM PHOSPHATE 4 MG/ML IJ SOLN
10.0000 mg | Freq: Once | INTRAMUSCULAR | Status: AC
  Administered 2014-05-09: 10 mg via INTRAMUSCULAR
  Filled 2014-05-09: qty 3

## 2014-05-09 MED ORDER — BUTALBITAL-APAP-CAFFEINE 50-325-40 MG PO TABS: 1.0000 | ORAL_TABLET | Freq: Four times a day (QID) | ORAL | Status: DC | PRN

## 2014-05-09 MED ORDER — METOCLOPRAMIDE HCL 5 MG/ML IJ SOLN
10.0000 mg | Freq: Once | INTRAMUSCULAR | Status: AC
  Administered 2014-05-09: 10 mg via INTRAMUSCULAR
  Filled 2014-05-09: qty 2
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
## 2014-05-09 NOTE — Discharge Instructions (Signed)
General Headache Without Cause °A headache is pain or discomfort felt around the head or neck area. The specific cause of a headache may not be found. There are many causes and types of headaches. A few common ones are: °· Tension headaches. °· Migraine headaches. °· Cluster headaches. °· Chronic daily headaches. °HOME CARE INSTRUCTIONS  °· Keep all follow-up appointments with your caregiver or any specialist referral. °· Only take over-the-counter or prescription medicines for pain or discomfort as directed by your caregiver. °· Lie down in a dark, quiet room when you have a headache. °· Keep a headache journal to find out what may trigger your migraine headaches. For example, write down: °¨ What you eat and drink. °¨ How much sleep you get. °¨ Any change to your diet or medicines. °· Try massage or other relaxation techniques. °· Put ice packs or heat on the head and neck. Use these 3 to 4 times per day for 15 to 20 minutes each time, or as needed. °· Limit stress. °· Sit up straight, and do not tense your muscles. °· Quit smoking if you smoke. °· Limit alcohol use. °· Decrease the amount of caffeine you drink, or stop drinking caffeine. °· Eat and sleep on a regular schedule. °· Get 7 to 9 hours of sleep, or as recommended by your caregiver. °· Keep lights dim if bright lights bother you and make your headaches worse. °SEEK MEDICAL CARE IF:  °· You have problems with the medicines you were prescribed. °· Your medicines are not working. °· You have a change from the usual headache. °· You have nausea or vomiting. °SEEK IMMEDIATE MEDICAL CARE IF:  °· Your headache becomes severe. °· You have a fever. °· You have a stiff neck. °· You have loss of vision. °· You have muscular weakness or loss of muscle control. °· You start losing your balance or have trouble walking. °· You feel faint or pass out. °· You have severe symptoms that are different from your first symptoms. °MAKE SURE YOU:  °· Understand these  instructions. °· Will watch your condition. °· Will get help right away if you are not doing well or get worse. °Document Released: 03/01/2005 Document Revised: 05/24/2011 Document Reviewed: 03/17/2011 °ExitCare® Patient Information ©2015 [employer]. This information is not intended to replace advice given to you by your health care provider. Make sure you discuss any questions you have with your health care provider. ° ° °Emergency Department Resource Guide °1) Find a Doctor and Pay Out of Pocket °Although you won't have to find out who is covered by your insurance plan, it is a good idea to ask around and get recommendations. You will then need to call the office and see if the doctor you have chosen will accept you as a new patient and what types of options they offer for patients who are self-pay. Some doctors offer discounts or will set up payment plans for their patients who do not have insurance, but you will need to ask so you aren't surprised when you get to your appointment. ° °2) Contact Your Local Health Department °Not all health departments have doctors that can see patients for sick visits, but many do, so it is worth a call to see if yours does. If you don't know where your local health department is, you can check in your phone book. The CDC also has a tool to help you locate your state's health department, and many state websites also have listings   of all of their local health departments. ° °3) Find a Walk-in Clinic °If your illness is not likely to be very severe or complicated, you may want to try a walk in clinic. These are popping up all over the country in pharmacies, drugstores, and shopping centers. They're usually staffed by nurse practitioners or physician assistants that have been trained to treat common illnesses and complaints. They're usually fairly quick and inexpensive. However, if you have serious medical issues or chronic medical problems, these are probably not your best  option. ° °No Primary Care Doctor: °- Call [facility] at  832-8000 - they can help you locate a primary care doctor that  accepts your insurance, provides certain services, etc. °- Physician Referral Service- 1-800-533-3463 ° °Chronic Pain Problems: °Organization         Address  Phone   Notes  °[facility]  (336) 297-2271 Patients need to be referred by their primary care doctor.  ° °Medication Assistance: °Organization         Address  Phone   Notes  °[place] [facility] [address] °[place], [place] [address] (336) 641-8030 --Must be a resident of [place] °-- Must have NO insurance coverage whatsoever (no Medicaid/ Medicare, etc.) °-- The pt. MUST have a primary care doctor that directs their care regularly and follows them in the community °  °[facility]  (866) 331-1348   °[employer]  (888) 892-1162   ° °Agencies that provide inexpensive medical care: °Organization         Address  Phone   Notes  °[facility]  (336) 832-8035   ° Internal Medicine    (336) 832-7272   °[facility] [address] °[place], [place] [address] (336) 832-4777   °[facility] [address], °[place] (336) 271-4999   °[facility]    (336) 373-0678   °[facility]    (336) 272-1050   °[facility] ° [address], [place] Phone:  (336) 832-4444, Fax:  (336) 832-4440 Hours of Operation:  9 am - 6 pm, M-F.  Also accepts Medicaid/Medicare and self-pay.  °[facility] ° [address], [place] Phone: (336) 832-3150, Fax: (336) 832-3151. Hours of Operation:  8:30 am - 5:30 pm, M-F.  Also accepts Medicaid and self-pay.  °[facility] [place] [address], [place] Phone: (336) 878-6027   °[facility] [address], [place], [place] (336)723-1848, Ext. 123 Mondays & Thursdays: 7-9 AM.  First 15  patients are seen on a first come, first serve basis. °  ° °Medicaid-accepting [place] Providers: ° °Organization         Address  Phone   Notes  °[facility] [address], [place] (336) 641-2100 Also accepts self-pay patients.  °[facility] [address], [place] ° (336) 856-9996   °[facility] [address], [place] (336) 288-8857   °[facility] [address] [place] [address], [place] (336) 299-7000   °Veita Bland [address], [place]  ° (336) 373-1557 Only accepts [place] Access Medicaid patients after they have their name applied to their card.  ° °Self-Pay (no insurance) in [place]: ° °Organization         Address  Phone   Notes  °  Sickle Cell Patients, [facility] [address], [place] (336) 832-1970   °[facility] [address], [place] (336) 832-4400   °[facility] ° [address], [place] (336) 992-4800   °[facility]/Dr. Osei-Bonsu ° [address] [place] [address], [place] or [address], [place] (336) 841-8500 Phone number for both [place] and [place] locations is the same.  °[facility] [address], [place] (336) 299-0000   °[facility] [address] [place] [address], [place] or [address] (336) 852-7530 °(336) 878-2260   °[facility] [address], [place] (336) 350-1642, phone; (336) 294-5005, fax Sees patients 1st and 3rd Saturday of every month.  Must not qualify for public or private insurance (i.e. Medicaid, Medicare, [place] Health Choice, Veterans' Benefits) • Household income should be no more than 200% of the poverty level •The clinic cannot treat you if you are pregnant or think you are pregnant • Sexually transmitted diseases are not treated at the clinic.  ° ° °Dental  Care: °Organization         Address  Phone  Notes  °[facility] [address], [place] (336) 641-6152 Accepts children up to age 21 who are enrolled in Medicaid or [place] Health Choice; pregnant women with a Medicaid card; and children who have applied for Medicaid or [place] Health Choice, but were declined, whose parents can pay a reduced fee at time of service.  °[facility] [place]  [address], [place] (336) 641-7733 Accepts children up to age 21 who are enrolled in Medicaid or [place] Health Choice; pregnant women with a Medicaid card; and children who have applied for Medicaid or [place] Health Choice, but were declined, whose parents can pay a reduced fee at time of service.  °[facility] ° [address], [place] (336) 641-4533 Patients are seen by appointment only. Walk-ins are not accepted. [facility] will see patients 18 years of age and older. °Monday - Tuesday (8am-5pm) °Most Wednesdays (8:30-5pm) °$30 per visit, cash only  °[facility] ° [address], [place] (336) 641-4533 Patients are seen by appointment only. Walk-ins are not accepted. [facility] will see patients 18 years of age and older. °One Wednesday Evening (Monthly: Volunteer Based).  $30 per visit, cash only  °[facility]  (919) [phone] for adults; Children under age 4, call Graduate Pediatric Dentistry at ([phone]) [phone]. Children aged 4-14, please call ([phone]) [phone] to request a pediatric application. ° Dental services are provided in all areas of dental care including fillings, crowns and bridges, complete and partial dentures, implants, gum treatment, root canals, and extractions. Preventive care is also provided. Treatment is provided to both adults and children. °Patients are selected via a lottery and there is often a waiting list. °  °[facility] [address], °[place] ° (336) 763-8833 [email or web address] °  °[facility] [address], [place], [place] (336)723-1848, Ext. 123 Second and Fourth Thursday of each month, opens at 6:30 AM; Clinic ends at 9 AM.  Patients are seen on a first-come first-served basis, and a limited number are seen during each clinic.  ° °[facility] ° [address], [place], [place] (336) 723-7904   Eligibility   Requirements °You must have lived in [place], [place], or [place] counties for at least the last three months. °  You cannot be eligible for state or federal sponsored healthcare insurance, including Veterans Administration, Medicaid, or Medicare. °  You generally cannot be eligible for healthcare insurance through your employer.  °  How to apply: °Eligibility screenings are held every Tuesday and Wednesday afternoon from 1:00 pm until 4:00 pm. You do not need an appointment for the interview!  °[facility] [address], [place], [place] 336-631-2330   °[facility]  336-342-8273   °[facility]  336-703-3100   °[facility]  336-570-6415   ° °Behavioral Health Resources in the Community: °Intensive Outpatient Programs °Organization         Address  Phone  Notes  °[facility] [address], [place], [place] 336-878-6098   °[facility] [address], [place], [place] 336-832-9800   °[facility] [address], [place], [place] ° 336-882-2125   °[facility] [address],  °[place], [place] 1-800-853-5163 or 336-641-4981   °Substance Abuse Resources °Organization         Address  Phone  Notes  °[facility]  336-882-2125   °[facility]  336-784-9470   °[facility]  336-285-9073   °[facility]  336-845-3988   °Residential & Outpatient Substance Abuse Program  1-800-659-3381    °Psychological Services °Organization         Address  Phone  Notes  °[facility]  336- 832-9600   °[facility]  336- 378-7881   °[facility] [address], [place] 1-800-853-5163 or 336-641-4981   ° °Mobile Crisis Teams °Organization         Address  Phone  Notes  °[facility], Mobile Crisis Care Unit  1-877-626-1772   °Assertive °[facility] ° [address] [place], [place] 336-834-9664   °Sharon DeEsch [address] °[place] 336-554-5454   ° °Self-Help/Support Groups °Organization         Address  Phone             Notes  °[facility] - variety of support groups  336- 373-1402 Call for more information  °Narcotics Anonymous (NA), [facility] [address], °[place]  2 meetings at this location  ° °Residential Treatment Programs °Organization         Address  Phone  Notes  °[facility] [address],    °[place]  1-866-801-8205   °[facility] ° [address], [place], [place] 704-293-8524   °[facility] [address], [place] 336-845-3988 Admissions: 8am-3pm M-F  °[facility] [address]    °[place], [place] 336-841-1104   °[facility] [address], [place], [place] 336-379-7146   °[facility] [address]  °[place], [place] 336-285-9073   °[facility] Intensive Outpatient [address], [place], [place] 336-852-3033   °[facility] [address]  °[place], [place] 1-877-615-2722 or 336-784-9470   °[facility] [address] [place], [place] 336-227-7417 Accepts Medicaid  °[facility] [address]  °[place]  1-800-659-3381 Substance Abuse/Addiction Treatment  ° °[place] Behavioral Health Resources °Organization           Address  Phone  Notes  [facility]  (925)559-0410   Domenic Schwab, PhD [address] [place], [place]   223-639-9778 or (712)153-2141   [facility] [place], [place] 437-371-5686   [address], [place], [place] [phone] Insurance/Medicaid/sponsorship through [facility] [address] [place]                                    [place], [place] [phone] [place] [address], [place] [phone]    Dr. Adele Schilder  [phone]   [facility] 1) [address] [place] 2) [place] 3)  [address], [place] [phone]  [phone]   [place] [phone] or [phone] (After Hours)

## 2014-05-10 LAB — POC URINE PREG, ED: Preg Test, Ur: NEGATIVE

## 2014-05-19 ENCOUNTER — Encounter (HOSPITAL_COMMUNITY): Payer: Self-pay

## 2014-05-19 ENCOUNTER — Emergency Department (HOSPITAL_COMMUNITY)
Admission: EM | Admit: 2014-05-19 | Discharge: 2014-05-19 | Disposition: A | Discharge status: Home / Self Care | Payer: Self-pay | Attending: Emergency Medicine | Admitting: Emergency Medicine

## 2014-05-19 DIAGNOSIS — E663 Overweight: Secondary | ICD-10-CM | POA: Insufficient documentation

## 2014-05-19 DIAGNOSIS — N94819 Vulvodynia, unspecified: Secondary | ICD-10-CM | POA: Insufficient documentation

## 2014-05-19 DIAGNOSIS — Z880 Allergy status to penicillin: Secondary | ICD-10-CM | POA: Insufficient documentation

## 2014-05-19 DIAGNOSIS — Z3202 Encounter for pregnancy test, result negative: Secondary | ICD-10-CM | POA: Insufficient documentation

## 2014-05-19 DIAGNOSIS — Z720 Tobacco use: Secondary | ICD-10-CM | POA: Insufficient documentation

## 2014-05-19 DIAGNOSIS — Z79899 Other long term (current) drug therapy: Secondary | ICD-10-CM | POA: Insufficient documentation

## 2014-05-19 DIAGNOSIS — Z91040 Latex allergy status: Secondary | ICD-10-CM | POA: Insufficient documentation

## 2014-05-19 DIAGNOSIS — Z791 Long term (current) use of non-steroidal anti-inflammatories (NSAID): Secondary | ICD-10-CM | POA: Insufficient documentation

## 2014-05-19 DIAGNOSIS — N941 Dyspareunia: Secondary | ICD-10-CM | POA: Insufficient documentation

## 2014-05-19 DIAGNOSIS — Z87828 Personal history of other (healed) physical injury and trauma: Secondary | ICD-10-CM | POA: Insufficient documentation

## 2014-05-19 DIAGNOSIS — Z86010 Personal history of colonic polyps: Secondary | ICD-10-CM | POA: Insufficient documentation

## 2014-05-19 DIAGNOSIS — Z85040 Personal history of malignant carcinoid tumor of rectum: Secondary | ICD-10-CM | POA: Insufficient documentation

## 2014-05-19 DIAGNOSIS — IMO0002 Reserved for concepts with insufficient information to code with codable children: Secondary | ICD-10-CM

## 2014-05-19 DIAGNOSIS — Z8719 Personal history of other diseases of the digestive system: Secondary | ICD-10-CM | POA: Insufficient documentation

## 2014-05-19 LAB — WET PREP, GENITAL
CLUE CELLS WET PREP: NONE SEEN
Trich, Wet Prep: NONE SEEN
WBC WET PREP: NONE SEEN
Yeast Wet Prep HPF POC: NONE SEEN

## 2014-05-19 LAB — URINALYSIS, ROUTINE W REFLEX MICROSCOPIC
BILIRUBIN URINE: NEGATIVE
GLUCOSE, UA: NEGATIVE mg/dL
Ketones, ur: NEGATIVE mg/dL
Leukocytes, UA: NEGATIVE
Nitrite: NEGATIVE
PH: 5.5 (ref 5.0–8.0)
Protein, ur: NEGATIVE mg/dL
UROBILINOGEN UA: 0.2 mg/dL (ref 0.0–1.0)

## 2014-05-19 LAB — URINE MICROSCOPIC-ADD ON

## 2014-05-19 LAB — PREGNANCY, URINE: PREG TEST UR: NEGATIVE

## 2014-05-19 MED ORDER — LIDOCAINE 5 % EX OINT: 1.0000 "application " | TOPICAL_OINTMENT | Freq: Three times a day (TID) | CUTANEOUS | Status: DC | PRN

## 2014-05-19 NOTE — ED Notes (Signed)
Patient complaining of vaginal pain with sexual intercourse; having discharge; and it hurts when she urinates.

## 2014-05-19 NOTE — Discharge Instructions (Signed)
Dyspareunia Dyspareunia is pain during sexual intercourse. It is most common in women, but it also happens in men.  CAUSES  Female The pain from this condition is usually felt when anything is put into the vagina, but any part of the genitals may cause pain during sex. Even sitting or wearing pants can cause pain. Sometimes, a cause cannot be found. Some causes of pain during intercourse are:  Infections of the skin around the vagina.  Vaginal infections, such as a yeast, bacterial, or viral infection.  Vaginismus. This is the inability to have anything put in the vagina even when the woman wants it to happen. There is an automatic muscle contraction and pain. The pain of the muscle contraction can be so severe that intercourse is impossible.  Allergic reaction from spermicides, semen, condoms, scented tampons, soaps, douches, and vaginal sprays.  A fluid-filled sac (cyst) on the Bartholin or Skene glands, located at the opening of the vagina.  Scar tissue in the vagina from a surgically enlarged opening (episiotomy) or tearing after delivering a baby.  Vaginal dryness. This is more common in menopause. The normal secretions of the vagina are decreased. Changes in estrogen levels and increased difficulty becoming aroused can cause painful sex. Vaginal dryness can also happen when taking birth control pills.  Thinning of the tissue (atrophy) of the vulva and vagina. This makes the area thinner, smaller, unable to stretch to accommodate a penis, and prone to infection and tearing.  Vulvar vestibulitis or vestibulodynia.This is a condition that causes pain involving the area around the entrance to the vagina.The most common cause in young women is birth control pills.Women with low estrogen levels (postmenopausal women) may also experience this.Other causes include allergic reactions, too many nerve endings, skin conditions, and pelvic muscles that cannot relax.  Vulvar dermatoses. This  includes skin conditions such as lichen sclerosus and lichen planus.  Lack of foreplay to lubricate the vagina. This can cause vaginal dryness.  Noncancerous tumors (fibroids) in the uterus.  Uterus lining tissue growing outside the uterus (endometriosis).  Pregnancy that starts in the fallopian tube (tubal pregnancy).  Pregnancy or breastfeeding your baby. This can cause vaginal dryness.  A tilting or prolapse of the uterus. Prolapse is when weak and stretched muscles around the uterus allow it to fall into the vagina.  Problems with the ovaries, cysts, or scar tissue. This may be worse with certain sexual positions.  Previous surgeries causing adhesions or scar tissue in the vagina or pelvis.  Bladder and intestinal problems.  Psychological problems (such as depression or anxiety). This may make pain worse.  Negative attitudes about sex, experiencing rape, sexual assault, and misinformation about sex. These issues are often related to some types of pain.  Previous pelvic infection, causing scar tissue in the pelvis and on the female organs.  Cyst or tumor on the ovary.  Cancer of the female organs.  Certain medicines.  Medical problems such as diabetes, arthritis, or thyroid disease. Female In men, there are many physical causes of sexual discomfort. Some causes of pain during intercourse are:  Infections of the prostate, bladder, or seminal vesicles. This can cause pain after ejaculation.  An inflamed bladder (interstitial cystitis). This may cause pain from ejaculation.  Gonorrheal infections. This may cause pain during ejaculation.  An inflamed urethra (urethritis) or inflamed prostate (prostatitis). This can make genital stimulation painful or uncomfortable.  Deformities of the penis, such as Peyronie's disease.  A tight foreskin.  Cancer of the female organs.  Psychological problems. This may make pain worse. DIAGNOSIS   Your caregiver will take a history and  have you describe where the pain is located (outside the vagina, in the vagina, in the pelvis). You may be asked when you experience pain, such as with penetration or with thrusting.  Following this, your caregiver will do a physical exam. Let your caregiver know if the exam is too painful.  During the final part of the female exam, your caregiver will feel your uterus and ovaries with one hand on the abdomen and one finger in your vagina. This is a pelvic exam.  Blood tests, a Pap test, cultures for infection, an ultrasound test, and X-rays may be done. You may need to see a specialist for female problems (gynecologist).  Your caregiver may do a CT scan, MRI, or laparoscopy. Laparoscopy is a procedure to look into the pelvis with a lighted tube, through a cut (incision) in the abdomen. TREATMENT  Your caregiver can help you determine the best course of treatment. Sometimes, more testing is done. Continue with the suggested testing until your caregiver feels sure about your diagnosis and how to treat it. Sometimes, it is difficult to find the reason for the pain. The search for the cause and treatment can be frustrating. Treatment often takes several weeks to a few months before you notice any improvement. You may also need to avoid sexual activity until symptoms improve.Continuing to have sex when it hurts can delay healing and actually make the problem worse. The treatment depends on the cause of the pain. Treatment may include:  Medicines such as antibiotics, vaginal or skin creams, hormones, or antidepressants.  Minor or major surgery.  Psychological counseling or group therapy.  Kegel exercises and vaginal dilators to help certain cases of vaginismus (spasms). Do this only if recommended by your caregiver.Kegel exercises can make some problems worse.  Applying lubrication as recommended by your caregiver if you have dryness.  Sex therapy for you and your sex partner. It is common for  the pain to continue after the reason for the pain has been treated. Some reasons for this include a conditioned response. This means the person having the pain becomes so familiar with the pain that the pain continues as a response, even though the cause is removed. Sex therapy can help with this problem. HOME CARE INSTRUCTIONS   Follow your caregiver's instructions about taking medicines, tests, counseling, and follow-up treatment.  Do not use scented tampons, douches, vaginal sprays, or soaps.  Use water-based lubricants for dryness. Oil lubricants can cause irritation.  Do not use spermicides or condoms that irritate you.  Openly discuss with your partner your sexual experience, your desires, foreplay, and different sexual positions for a more comfortable and enjoyable sexual relationship.  Join group sessions for therapy, if needed.  Practice safe sex at all times.  Empty your bladder before having intercourse.  Try different positions during sexual intercourse.  Take over-the-counter pain medicine recommended by your caregiver before having sexual intercourse.  Do not wear pantyhose. Knee-high and thigh-high hose are okay.  Avoid scrubbing your vulva with a washcloth. Wash the area gently and pat dry with a towel. SEEK MEDICAL CARE IF:   You develop vaginal bleeding after sexual intercourse.  You develop a lump at the opening of your vagina, even if it is not painful.  You have abnormal vaginal discharge.  You have vaginal dryness.  You have itching or irritation of the vulva or vagina.  You  develop a rash or reaction to your medicine. SEEK IMMEDIATE MEDICAL CARE IF:   You develop severe abdominal pain during or shortly after sexual intercourse. You could have a ruptured ovarian cyst or ruptured tubal pregnancy.  You have a fever.  You have painful or bloody urination.  You have painful sexual intercourse, and you never had it before.  You pass out after having  sexual intercourse. Document Released: 03/21/2007 Document Revised: 05/24/2011 Document Reviewed: 06/01/2010 [facility] Patient Information 2015 [place], [place]. This information is not intended to replace advice given to you by your health care provider. Make sure you discuss any questions you have with your health care provider.

## 2014-05-19 NOTE — ED Notes (Signed)
Discharge instructions given, pt demonstrated teach back and verbal understanding. No concerns voiced.  

## 2014-05-19 NOTE — ED Provider Notes (Signed)
CSN: [id number]     Arrival date & time 05/19/14  0024 History   First MD Initiated Contact with Patient 05/19/14 0034     Chief Complaint  Patient presents with  . Vaginitis     (Consider location/radiation/quality/duration/timing/severity/associated sxs/prior Treatment) HPI  This is a 29 year old female who presents with dyspareunia and vaginal discharge. Patient reports history of similar symptoms. She was seen at an outside hospital and diagnosed with vaginitis. She was given a cream which seemed to help. Patient states that tonight she was having sex with her husband and had worsening pain with penetration. She reports persistent white discharge.  She is allergic to latex and they do not use condoms. She denies any use of vaginal creams or any new medications. She does not have an OB/GYN. Patient also reports dysuria. She has not noted any lesions.  Past Medical History  Diagnosis Date  . Right ankle injury   . Colon polyp   . Rectal polyp   . Carcinoid tumor of rectum   . 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 GERD (gastroesophageal reflux disease)    Past Surgical History  Procedure Laterality Date  . Colonoscopy with esophagogastroduodenoscopy (egd) N/A 05/16/2013    Dr. Gala Romney: rectal polyp with low grade carcinoid tumor, colon polyp with tubulovillous adenoma with high grade dysplasia. Needs repeat colonoscopy in 3 months. EGD with antral erosions, duodenal bulb erosions, superficial ulceration. path with mild chronic gastritis   . Colonoscopy with propofol N/A 09/06/2013    [facility]:[id number] ileocolonoscopy. No evidence  of residual polyp anywhere. Next TCS 08/2016.  [name] Cholecystectomy     Family History  Problem Relation Age of Onset  . Colon cancer Maternal Grandmother   . Cervical cancer Mother   . Asthma    . Diabetes     History  Substance Use Topics  . Smoking status: Current Every Day Smoker -- 1.00 packs/day for 11 years    Types: Cigarettes  .  Smokeless tobacco: Not on file     Comment: Smokes one pack of cigarettes daily  . Alcohol Use: Yes     Comment: occasionally   OB History    No data available     Review of Systems  Constitutional: Negative for fever.  Respiratory: Negative for chest tightness and shortness of breath.   Cardiovascular: Negative for chest pain.  Gastrointestinal: Negative for nausea, vomiting and abdominal pain.  Genitourinary: Positive for dysuria, vaginal discharge and dyspareunia. Negative for decreased urine volume, vaginal bleeding and difficulty urinating.  Musculoskeletal: Negative for back pain.  Neurological: Negative for headaches.  All other systems reviewed and are negative.     Allergies  Codeine; Erythromycin; Hydromorphone hcl; Nsaids; Penicillins; Bentyl; Latex; and Tape  Home Medications   Prior to Admission medications   Medication Sig Start Date End Date Taking? Authorizing Provider  acetaminophen (TYLENOL) 500 MG tablet Take 1,000 mg by mouth every 6 (six) hours as needed for moderate pain.    Historical Provider, MD  butalbital-acetaminophen-caffeine (FIORICET) [phone] MG per tablet Take 1-2 tablets by mouth every 6 (six) hours as needed for headache. 05/09/14 05/09/15  [name], PA-C  cyclobenzaprine (FLEXERIL) 5 MG tablet Take 1 tablet (5 mg total) by mouth 3 (three) times daily as needed. Patient not taking: Reported on 03/29/2014 12/26/13   [name], PA-C  Eluxadoline (VIBERZI) 100 MG TABS Take 1 tablet by mouth 2 (two) times daily with a meal. 03/25/14   Vicente Males  Annie Sable, NP  HYDROcodone-acetaminophen (NORCO/VICODIN) 5-325 MG per tablet Take 1 tablet by mouth every 6 (six) hours as needed. Patient not taking: Reported on 03/29/2014 01/23/14   Maudry Diego, MD  HYDROcodone-acetaminophen (NORCO/VICODIN) 5-325 MG per tablet Take 1-2 tablets by mouth every 6 (six) hours as needed. Patient not taking: Reported on 05/09/2014 03/29/14   Fredia Sorrow, MD  lidocaine (XYLOCAINE)  5 % ointment Apply 1 application topically 3 (three) times daily as needed. 05/19/14   Merryl Hacker, MD  meloxicam (MOBIC) 15 MG tablet Take 15 mg by mouth daily.    Historical Provider, MD  PARoxetine (PAXIL) 10 MG tablet Take 10 mg by mouth daily.    Historical Provider, MD   BP 122/80 mmHg  Pulse 99  Temp(Src) 98 F (36.7 C) (Oral)  Resp 20  Ht 5' 7.5" (1.715 m)  Wt 216 lb (97.977 kg)  BMI 33.31 kg/m2  SpO2 98%  LMP 12/05/2005 Physical Exam  Constitutional: She is oriented to person, place, and time. She appears well-developed and well-nourished. No distress.  Overweight  HENT:  Head: Normocephalic and atraumatic.  Cardiovascular: Normal rate, regular rhythm and normal heart sounds.   Pulmonary/Chest: Effort normal. No respiratory distress.  Abdominal: Soft. Bowel sounds are normal. There is no tenderness. There is no rebound.  Genitourinary:  External vaginal exam within normal limits, no erythema or lesions noted, scant white vaginal discharge noted on speculum exam, no cervical motion tenderness, tenderness at introitus without obvious abrasion, erythema, or irritation  Neurological: She is alert and oriented to person, place, and time.  Skin: Skin is warm and dry.  Psychiatric: She has a normal mood and affect.  Nursing note and vitals reviewed.   ED Course  Procedures (including critical care time) Labs Review Labs Reviewed  URINALYSIS, ROUTINE W REFLEX MICROSCOPIC - Abnormal; Notable for the following:    Specific Gravity, Urine >1.030 (*)    Hgb urine dipstick TRACE (*)    All other components within normal limits  URINE MICROSCOPIC-ADD ON - Abnormal; Notable for the following:    Squamous Epithelial / LPF FEW (*)    Bacteria, UA FEW (*)    All other components within normal limits  WET PREP, GENITAL  PREGNANCY, URINE  RPR  HIV ANTIBODY (ROUTINE TESTING)  GC/CHLAMYDIA PROBE AMP ([place])    Imaging Review No results found.   EKG  Interpretation None      MDM   Final diagnoses:  Dyspareunia  Vulvodynia    Patient presents with dyspareunia and vulvar diarrhea. Nontoxic on exam. Vaginal exam without any obvious cause for her symptoms. STD screen sent. Wet prep reassuring. No evidence of UTI. Given persistent and recurrent dyspareunia, discuss with patient the need to follow-up with a specialist. Patient does not have insurance. Patient given contact information for the [facility] and was also encouraged to contact the health department for resources. Prescription for local lidocaine ointment provided for symptomatic relief.  After history, exam, and medical workup I feel the patient has been appropriately medically screened and is safe for discharge home. Pertinent diagnoses were discussed with the patient. Patient was given return precautions.   Merryl Hacker, MD 05/19/14 [phone]

## 2014-05-20 LAB — GC/CHLAMYDIA PROBE AMP (~~LOC~~) NOT AT ARMC
CHLAMYDIA, DNA PROBE: NEGATIVE
NEISSERIA GONORRHEA: NEGATIVE

## 2014-05-20 LAB — HIV ANTIBODY (ROUTINE TESTING W REFLEX): HIV SCREEN 4TH GENERATION: NONREACTIVE

## 2014-05-20 LAB — RPR: RPR Ser Ql: NONREACTIVE

## 2014-06-05 ENCOUNTER — Encounter (HOSPITAL_COMMUNITY): Payer: Self-pay | Admitting: *Deleted

## 2014-06-05 ENCOUNTER — Emergency Department (HOSPITAL_COMMUNITY)
Admission: EM | Admit: 2014-06-05 | Discharge: 2014-06-05 | Disposition: A | Discharge status: Home / Self Care | Payer: Self-pay | Attending: Emergency Medicine | Admitting: Emergency Medicine

## 2014-06-05 DIAGNOSIS — Z720 Tobacco use: Secondary | ICD-10-CM | POA: Insufficient documentation

## 2014-06-05 DIAGNOSIS — Z91040 Latex allergy status: Secondary | ICD-10-CM | POA: Insufficient documentation

## 2014-06-05 DIAGNOSIS — M791 Myalgia: Secondary | ICD-10-CM | POA: Insufficient documentation

## 2014-06-05 DIAGNOSIS — J039 Acute tonsillitis, unspecified: Secondary | ICD-10-CM | POA: Insufficient documentation

## 2014-06-05 DIAGNOSIS — Z86010 Personal history of colonic polyps: Secondary | ICD-10-CM | POA: Insufficient documentation

## 2014-06-05 DIAGNOSIS — Z79899 Other long term (current) drug therapy: Secondary | ICD-10-CM | POA: Insufficient documentation

## 2014-06-05 DIAGNOSIS — Z8719 Personal history of other diseases of the digestive system: Secondary | ICD-10-CM | POA: Insufficient documentation

## 2014-06-05 DIAGNOSIS — Z86018 Personal history of other benign neoplasm: Secondary | ICD-10-CM | POA: Insufficient documentation

## 2014-06-05 DIAGNOSIS — Z880 Allergy status to penicillin: Secondary | ICD-10-CM | POA: Insufficient documentation

## 2014-06-05 DIAGNOSIS — Z87828 Personal history of other (healed) physical injury and trauma: Secondary | ICD-10-CM | POA: Insufficient documentation

## 2014-06-05 DIAGNOSIS — Z791 Long term (current) use of non-steroidal anti-inflammatories (NSAID): Secondary | ICD-10-CM | POA: Insufficient documentation

## 2014-06-05 MED ORDER — AZITHROMYCIN 250 MG PO TABS
500.0000 mg | ORAL_TABLET | Freq: Once | ORAL | Status: AC
Start: 2014-06-05 — End: 2014-06-05
  Administered 2014-06-05: 500 mg via ORAL
  Filled 2014-06-05: qty 2

## 2014-06-05 MED ORDER — AZITHROMYCIN 250 MG PO TABS: 500.0000 mg | ORAL_TABLET | Freq: Every day | ORAL | Status: DC

## 2014-06-05 NOTE — ED Provider Notes (Signed)
CSN: [id number]     Arrival date & time 06/05/14  1212 History   First MD Initiated Contact with Patient 06/05/14 1235     Chief Complaint  Patient presents with  . Sore Throat     (Consider location/radiation/quality/duration/timing/severity/associated sxs/prior Treatment) Patient is a 29 y.o. female presenting with pharyngitis. The history is provided by the patient.  Sore Throat This is a new problem. The current episode started yesterday. The problem occurs constantly. The problem has been gradually worsening. Associated symptoms include chills, congestion, a fever, myalgias, a sore throat and swollen glands. Pertinent negatives include no abdominal pain, chest pain, coughing, nausea or neck pain. The symptoms are aggravated by swallowing. She has tried acetaminophen for the symptoms. The treatment provided no relief.    Past Medical History  Diagnosis Date  . Right ankle injury   . Colon polyp   . Rectal polyp   . Carcinoid tumor of rectum   . 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 GERD (gastroesophageal reflux disease)    Past Surgical History  Procedure Laterality Date  . Colonoscopy with esophagogastroduodenoscopy (egd) N/A 05/16/2013    Dr. Gala Romney: rectal polyp with low grade carcinoid tumor, colon polyp with tubulovillous adenoma with high grade dysplasia. Needs repeat colonoscopy in 3 months. EGD with antral erosions, duodenal bulb erosions, superficial ulceration. path with mild chronic gastritis   . Colonoscopy with propofol N/A 09/06/2013    [id number] ileocolonoscopy. No evidence  of residual polyp anywhere. Next TCS 08/2016.  [name] Cholecystectomy     Family History  Problem Relation Age of Onset  . Colon cancer Maternal Grandmother   . Cervical cancer Mother   . Asthma    . Diabetes     History  Substance Use Topics  . Smoking status: Current Every Day Smoker -- 1.00 packs/day for 11 years    Types: Cigarettes  . Smokeless tobacco: Not on  file     Comment: Smokes one pack of cigarettes daily  . Alcohol Use: Yes     Comment: occasionally   OB History    No data available     Review of Systems  Constitutional: Positive for fever and chills.  HENT: Positive for congestion and sore throat. Negative for ear pain, rhinorrhea, sinus pressure, trouble swallowing and voice change.   Eyes: Negative for discharge.  Respiratory: Negative for cough, shortness of breath, wheezing and stridor.   Cardiovascular: Negative for chest pain.  Gastrointestinal: Negative for nausea and abdominal pain.  Genitourinary: Negative.   Musculoskeletal: Positive for myalgias. Negative for neck pain.      Allergies  Codeine; Erythromycin; Hydromorphone hcl; Nsaids; Penicillins; Bentyl; Latex; and Tape  Home Medications   Prior to Admission medications   Medication Sig Start Date End Date Taking? Authorizing Provider  acetaminophen (TYLENOL) 500 MG tablet Take 1,000 mg by mouth every 6 (six) hours as needed for moderate pain.    Historical Provider, MD  azithromycin (ZITHROMAX Z-PAK) 250 MG tablet Take 2 tablets (500 mg total) by mouth daily. 06/05/14   [name], PA-C  butalbital-acetaminophen-caffeine (FIORICET) [phone] MG per tablet Take 1-2 tablets by mouth every 6 (six) hours as needed for headache. 05/09/14 05/09/15  [name], PA-C  cyclobenzaprine (FLEXERIL) 5 MG tablet Take 1 tablet (5 mg total) by mouth 3 (three) times daily as needed. Patient not taking: Reported on 03/29/2014 12/26/13   [name], PA-C  Eluxadoline (VIBERZI) 100 MG TABS Take 1 tablet by  mouth 2 (two) times daily with a meal. 03/25/14   Orvil Feil, NP  HYDROcodone-acetaminophen (NORCO/VICODIN) 5-325 MG per tablet Take 1 tablet by mouth every 6 (six) hours as needed. Patient not taking: Reported on 03/29/2014 01/23/14   Milton Ferguson, MD  HYDROcodone-acetaminophen (NORCO/VICODIN) 5-325 MG per tablet Take 1-2 tablets by mouth every 6 (six) hours as needed. Patient not  taking: Reported on 05/09/2014 03/29/14   Fredia Sorrow, MD  lidocaine (XYLOCAINE) 5 % ointment Apply 1 application topically 3 (three) times daily as needed. 05/19/14   Merryl Hacker, MD  meloxicam (MOBIC) 15 MG tablet Take 15 mg by mouth daily.    Historical Provider, MD  PARoxetine (PAXIL) 10 MG tablet Take 10 mg by mouth daily.    Historical Provider, MD   BP 125/90 mmHg  Pulse 98  Temp(Src) 98 F (36.7 C) (Oral)  Resp 16  Ht 5\' 7"  (1.702 m)  Wt 215 lb (97.523 kg)  BMI 33.67 kg/m2  SpO2 98%  LMP 12/05/2005 Physical Exam  Constitutional: She is oriented to person, place, and time. She appears well-developed and well-nourished.  HENT:  Head: Normocephalic and atraumatic.  Right Ear: Tympanic membrane and ear canal normal.  Left Ear: Tympanic membrane and ear canal normal.  Nose: No mucosal edema or rhinorrhea.  Mouth/Throat: Uvula is midline and mucous membranes are normal. Posterior oropharyngeal edema and posterior oropharyngeal erythema present. No oropharyngeal exudate or tonsillar abscesses.  Eyes: Conjunctivae are normal.  Neck: Normal range of motion. Neck supple. No spinous process tenderness present.  Cardiovascular: Normal rate and normal heart sounds.   Pulmonary/Chest: Effort normal. No stridor. No respiratory distress. She has no wheezes. She has no rales.  Abdominal: Soft. There is no tenderness.  Musculoskeletal: Normal range of motion.  Lymphadenopathy:       Head (left side): Tonsillar adenopathy present.    She has cervical adenopathy.  Neurological: She is alert and oriented to person, place, and time.  Skin: Skin is warm and dry. No rash noted.  Psychiatric: She has a normal mood and affect.    ED Course  Procedures (including critical care time) Labs Review Labs Reviewed - No data to display  Imaging Review No results found.   EKG Interpretation None      MDM   Final diagnoses:  Acute tonsillitis    Zithromax, ibuprofen, rest, increased  fluid intake.  Recheck by pcp or return here for any worsened sx.    [name], PA-C 06/05/14 1313  Pattricia Boss, MD 06/05/14 [phone]

## 2014-06-05 NOTE — ED Notes (Signed)
Pt states sore throat, body aches, and pain to her head began last night. Pt states she noticed tonsils were swollen with "puss pockets". Pt also states fever last night. NAD.

## 2014-06-05 NOTE — Discharge Instructions (Signed)
Tonsillitis Tonsillitis is an infection of the throat that causes the tonsils to become red, tender, and swollen. Tonsils are collections of lymphoid tissue at the back of the throat. Each tonsil has crevices (crypts). Tonsils help fight nose and throat infections and keep infection from spreading to other parts of the body for the first 18 months of life.  CAUSES Sudden (acute) tonsillitis is usually caused by infection with streptococcal bacteria. Long-lasting (chronic) tonsillitis occurs when the crypts of the tonsils become filled with pieces of food and bacteria, which makes it easy for the tonsils to become repeatedly infected. SYMPTOMS  Symptoms of tonsillitis include:  A sore throat, with possible difficulty swallowing.  White patches on the tonsils.  Fever.  Tiredness.  New episodes of snoring during sleep, when you did not snore before.  Small, foul-smelling, yellowish-white pieces of material (tonsilloliths) that you occasionally cough up or spit out. The tonsilloliths can also cause you to have bad breath. DIAGNOSIS Tonsillitis can be diagnosed through a physical exam. Diagnosis can be confirmed with the results of lab tests, including a throat culture. TREATMENT  The goals of tonsillitis treatment include the reduction of the severity and duration of symptoms and prevention of associated conditions. Symptoms of tonsillitis can be improved with the use of steroids to reduce the swelling. Tonsillitis caused by bacteria can be treated with antibiotic medicines. Usually, treatment with antibiotic medicines is started before the cause of the tonsillitis is known. However, if it is determined that the cause is not bacterial, antibiotic medicines will not treat the tonsillitis. If attacks of tonsillitis are severe and frequent, your health care provider may recommend surgery to remove the tonsils (tonsillectomy). HOME CARE INSTRUCTIONS   Rest as much as possible and get plenty of  sleep.  Drink plenty of fluids. While the throat is very sore, eat soft foods or liquids, such as sherbet, soups, or instant breakfast drinks.  Eat frozen ice pops.  Gargle with a warm or cold liquid to help soothe the throat. Mix 1/4 teaspoon of salt and 1/4 teaspoon of baking soda in 8 oz of water. SEEK MEDICAL CARE IF:   Large, tender lumps develop in your neck.  A rash develops.  A green, yellow-brown, or bloody substance is coughed up.  You are unable to swallow liquids or food for 24 hours.  You notice that only one of the tonsils is swollen. SEEK IMMEDIATE MEDICAL CARE IF:   You develop any new symptoms such as vomiting, severe headache, stiff neck, chest pain, or trouble breathing or swallowing.  You have severe throat pain along with drooling or voice changes.  You have severe pain, unrelieved with recommended medications.  You are unable to fully open the mouth.  You develop redness, swelling, or severe pain anywhere in the neck.  You have a fever. MAKE SURE YOU:   Understand these instructions.  Will watch your condition.  Will get help right away if you are not doing well or get worse. Document Released: 12/09/2004 Document Revised: 07/16/2013 Document Reviewed: 08/18/2012 [facility] Patient Information 2015 [place], [place]. This information is not intended to replace advice given to you by your health care provider. Make sure you discuss any questions you have with your health care provider.   Take your next dose of Zithromax tomorrow midday.  Use ibuprofen as needed for throat pain.  Rest mixture drinking plenty of fluids.  Recheck by your primary doctor or return here for any worsening symptoms.

## 2014-06-06 ENCOUNTER — Telehealth: Payer: Self-pay | Admitting: Internal Medicine

## 2014-06-06 ENCOUNTER — Ambulatory Visit: Payer: Self-pay | Admitting: Gastroenterology

## 2014-06-06 ENCOUNTER — Encounter: Payer: Self-pay | Admitting: Gastroenterology

## 2014-06-06 NOTE — Telephone Encounter (Signed)
PATIENT WAS A NO SHOW 06/06/14 AND LETTER SENT

## 2014-06-19 ENCOUNTER — Ambulatory Visit (INDEPENDENT_AMBULATORY_CARE_PROVIDER_SITE_OTHER): Payer: Self-pay | Admitting: Obstetrics & Gynecology

## 2014-06-19 ENCOUNTER — Encounter: Payer: Self-pay | Admitting: Obstetrics & Gynecology

## 2014-06-19 VITALS — BP 125/82 | HR 76 | Temp 97.8°F | Ht 67.0 in | Wt 220.7 lb

## 2014-06-19 DIAGNOSIS — G8929 Other chronic pain: Secondary | ICD-10-CM

## 2014-06-19 DIAGNOSIS — R102 Pelvic and perineal pain: Secondary | ICD-10-CM

## 2014-06-19 DIAGNOSIS — N911 Secondary amenorrhea: Secondary | ICD-10-CM

## 2014-06-19 DIAGNOSIS — N898 Other specified noninflammatory disorders of vagina: Secondary | ICD-10-CM

## 2014-06-19 NOTE — Progress Notes (Addendum)
Subjective:     Patient ID: Patricia Rivers, female   DOB: Jan 13, 1986, 29 y.o.   MRN: [id number]  HPI Pt was on Depo Provera for 12 1/2 years and pt had severe menstrual cramps.  Pt was dx'd with fibroids- small.  When she stopped the depo provera her cycles were irreg for a few months and then her cycles stopped.  No cycles since 2013. She was eval in [place], [place] and given Provera.  Had spotting and has had no cycles since that time.  Pt now has pain in the pelvis daily.  Worse with intercourse.  Pt c/o vaginal dryness. Pt also c/o vaginal discharge.  Pt reports hot flushes- temp goes up and down.   Pain is consistent and has not changed but, has been consistent and unrelenting.      ROS: no nausea or emesis; no fever or chills.  Review of Systems     Objective:   Physical Exam BP 125/82 mmHg  Pulse 76  Temp(Src) 97.8 F (36.6 C) (Oral)  Ht 5\' 7"  (1.702 m)  Wt 220 lb 11.2 oz (100.109 kg)  BMI 34.56 kg/m2  LMP 03/06/2012 Pt in NAD HEENT: nonicteric Abd: soft, NT, ND; no scars noted GU: EGBUS: no lesions Vagina: no blood in vault Cervix: no lesion; no mucopurulent d/c Uterus: small, mobile Adnexa: no masses; non tender        Assessment:     Secondary amenorrhea- need to check to see if this is truly hormonal    Pelvic pain- unknown etiology.  Need to review prev sono.  Since pain has not changed and exam is normal will not repeat sono at present BV     Plan:     Labs: [facility] and [place] Give GO WHITE hand out- reviewed with pt and spouse Obtain results of sono from [place] F/u wet [place] F/u in 3 months or sooner prn   Reviewed pelvic sono from 02/01/2011 There are fibroids noted  Will order sono to check to see if there is a change.  This could certainly be the cause of the pts pain.

## 2014-06-19 NOTE — Addendum Note (Signed)
Addended by: Rutherford Nail E on: 06/19/2014 04:50 PM   Modules accepted: Orders

## 2014-06-19 NOTE — Patient Instructions (Addendum)
GO WHITE: Soap: UNSCENTED Dove (white box light green writing- for sensitive skin) Laundry detergent (underwear)- Dreft or [employer] unscented WHITE 100% cotton panties (NOT just cotton crouch) Sanitary napkin/panty liners: UNSCENTED.  If it doesn't SAY unscented it can have a scent/perfume    NO PERFUMES OR LOTIONS OR POTIONS in the vulvar area (may use regular [place]) Condoms: hypoallergenic only. Non dyed (no color) Toilet papers: white only Wash clothes: use a separate wash cloth. WHITE.  Washed in Dreft.     Bacterial Vaginosis Bacterial vaginosis is a vaginal infection that occurs when the normal balance of bacteria in the vagina is disrupted. It results from an overgrowth of certain bacteria. This is the most common vaginal infection in women of childbearing age. Treatment is important to prevent complications, especially in pregnant women, as it can cause a premature delivery. CAUSES  Bacterial vaginosis is caused by an increase in harmful bacteria that are normally present in smaller amounts in the vagina. Several different kinds of bacteria can cause bacterial vaginosis. However, the reason that the condition develops is not fully understood. RISK FACTORS Certain activities or behaviors can put you at an increased risk of developing bacterial vaginosis, including:  Having a new sex partner or multiple sex partners.  Douching.  Using an intrauterine device (IUD) for contraception. Women do not get bacterial vaginosis from toilet seats, bedding, swimming pools, or contact with objects around them. SIGNS AND SYMPTOMS  Some women with bacterial vaginosis have no signs or symptoms. Common symptoms include:  Grey vaginal discharge.  A fishlike odor with discharge, especially after sexual intercourse.  Itching or burning of the vagina and vulva.  Burning or pain with urination. DIAGNOSIS  Your health care provider will take a medical history and examine the vagina for signs of  bacterial vaginosis. A sample of vaginal fluid may be taken. Your health care provider will look at this sample under a microscope to check for bacteria and abnormal cells. A vaginal pH test may also be done.  TREATMENT  Bacterial vaginosis may be treated with antibiotic medicines. These may be given in the form of a pill or a vaginal cream. A second round of antibiotics may be prescribed if the condition comes back after treatment.  HOME CARE INSTRUCTIONS   Only take over-the-counter or prescription medicines as directed by your health care provider.  If antibiotic medicine was prescribed, take it as directed. Make sure you finish it even if you start to feel better.  Do not have sex until treatment is completed.  Tell all sexual partners that you have a vaginal infection. They should see their health care provider and be treated if they have problems, such as a mild rash or itching.  Practice safe sex by using condoms and only having one sex partner. SEEK MEDICAL CARE IF:   Your symptoms are not improving after 3 days of treatment.  You have increased discharge or pain.  You have a fever. MAKE SURE YOU:   Understand these instructions.  Will watch your condition.  Will get help right away if you are not doing well or get worse. FOR MORE INFORMATION  Centers for Disease Control and Prevention, Division of STD Prevention: AppraiserFraud.[employer] American Sexual Health Association (ASHA): www.ashastd.org  Document Released: 03/01/2005 Document Revised: 12/20/2012 Document Reviewed: 10/11/2012 [facility] Patient Information 2015 [place], [place]. This information is not intended to replace advice given to you by your health care provider. Make sure you discuss any questions you have with  your health care provider.

## 2014-06-20 ENCOUNTER — Telehealth: Payer: Self-pay

## 2014-06-20 LAB — FOLLICLE STIMULATING HORMONE: FSH: 94.3 m[IU]/mL

## 2014-06-20 LAB — WET PREP, GENITAL
CLUE CELLS WET PREP: NONE SEEN
TRICH WET PREP: NONE SEEN
WBC, Wet Prep HPF POC: NONE SEEN
Yeast Wet Prep HPF POC: NONE SEEN

## 2014-06-20 LAB — TSH: TSH: 2.095 u[IU]/mL (ref 0.350–4.500)

## 2014-06-20 NOTE — Telephone Encounter (Signed)
-----   Message from Lavonia Drafts, MD sent at 06/20/2014 10:41 AM EDT ----- Please call pt. 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  clh-S

## 2014-06-20 NOTE — Telephone Encounter (Signed)
Called patient and informed her of results. Patient verbalized understanding. Advised she call in June to schedule 3 month f/u for July. Patient verbalized understanding and gratitude. No questions or concerns.

## 2014-06-21 ENCOUNTER — Telehealth: Payer: Self-pay | Admitting: General Practice

## 2014-06-21 NOTE — Telephone Encounter (Signed)
Called Patricia Rivers back and she states she already talked to a nurse and was told she had ovarian failure/early menopause but she wanted to know if the doctor said why she had that.  I explained to her unfortunately I can not explain that , and yes she is young (29) to have that . She states there is a family history of her mom having ovarian cancer around same age. I explained she does have a follow up appointment in 3 months with the doctor and can discuss face to face then but I will send a message to doctor to see if there is other information we can give her.  [place] voices understanding.

## 2014-06-21 NOTE — Telephone Encounter (Signed)
Patient called and left message requesting results from her test. Called patient, no answer- left message stating we are trying to return your phone call, please call us back at the clinics

## 2014-06-24 NOTE — Telephone Encounter (Signed)
This was only a confirmatory test.  More discussion at her next visit.

## 2014-06-25 ENCOUNTER — Ambulatory Visit (HOSPITAL_COMMUNITY)
Admission: RE | Admit: 2014-06-25 | Discharge: 2014-06-25 | Disposition: A | Discharge status: Home / Self Care | Payer: Self-pay | Source: Ambulatory Visit | Attending: Obstetrics & Gynecology | Admitting: Obstetrics & Gynecology

## 2014-06-25 DIAGNOSIS — R102 Pelvic and perineal pain: Secondary | ICD-10-CM | POA: Insufficient documentation

## 2014-06-25 DIAGNOSIS — E669 Obesity, unspecified: Secondary | ICD-10-CM | POA: Insufficient documentation

## 2014-06-25 DIAGNOSIS — D259 Leiomyoma of uterus, unspecified: Secondary | ICD-10-CM | POA: Insufficient documentation

## 2014-06-25 DIAGNOSIS — G8929 Other chronic pain: Secondary | ICD-10-CM | POA: Insufficient documentation

## 2014-06-25 DIAGNOSIS — Z780 Asymptomatic menopausal state: Secondary | ICD-10-CM | POA: Insufficient documentation

## 2014-06-28 ENCOUNTER — Ambulatory Visit (INDEPENDENT_AMBULATORY_CARE_PROVIDER_SITE_OTHER): Payer: Self-pay | Admitting: Gastroenterology

## 2014-06-28 ENCOUNTER — Encounter: Payer: Self-pay | Admitting: Gastroenterology

## 2014-06-28 VITALS — BP 116/82 | HR 79 | Temp 96.5°F | Ht 67.0 in | Wt 219.4 lb

## 2014-06-28 DIAGNOSIS — K529 Noninfective gastroenteritis and colitis, unspecified: Secondary | ICD-10-CM

## 2014-06-28 DIAGNOSIS — K219 Gastro-esophageal reflux disease without esophagitis: Secondary | ICD-10-CM | POA: Insufficient documentation

## 2014-06-28 NOTE — Patient Instructions (Signed)
Please restart the Dexilant. Let me know if you need refills in the future.  I have given you samples of Viberzi to take twice a day with food. We will try and get this covered for you.   We will need to set up a breath test to check for bacterial overgrowth once you get the insurance reinstated.   Further recommendations to follow!

## 2014-06-28 NOTE — Progress Notes (Signed)
Referring Provider: Delphina Cahill, MD Primary Care Physician:  Delphina Cahill, MD  Primary GI: Dr. Gala Romney   Chief Complaint  Patient presents with  . Follow-up    HPI:   Patricia Rivers is a 29 y.o. 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 Followup colonoscopy demonstrated no evidence of residual lesion anywhere. She is slated to have another colonoscopy in 3 years. Urinary 5 HIAA normal. History of IBS-D.   Right-sided abdominal discomfort at last visit in Jan 2016. Updated HIDA Jan 2016 with reduction in EF to 24% from 77% the year prior. Referred to [facility]. Cholecystectomy completed.   Persistent diarrhea. Bottom is sore. Associated lower abdominal cramping. Worsened since cholecystectomy. Heartburn for last few days. Not on any PPI. Has Dexilant at home. RUQ resolved since cholecystectomy.   Past Medical History  Diagnosis Date  . Right ankle injury   . Colon polyp   . Rectal polyp   . Carcinoid tumor of rectum   . 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 GERD (gastroesophageal reflux disease)     Past Surgical History  Procedure Laterality Date  . Colonoscopy with esophagogastroduodenoscopy (egd) N/A 05/16/2013    Dr. Gala Romney: rectal polyp with low grade carcinoid tumor, colon polyp with tubulovillous adenoma with high grade dysplasia. Needs repeat colonoscopy in 3 months. EGD with antral erosions, duodenal bulb erosions, superficial ulceration. path with mild chronic gastritis   . Colonoscopy with propofol N/A 09/06/2013    EXB:MWUXLK ileocolonoscopy. No evidence  of residual polyp anywhere. Next TCS 08/2016.  [name] Cholecystectomy      Current Outpatient Prescriptions  Medication Sig Dispense Refill  . meloxicam (MOBIC) 15 MG tablet Take 15 mg by mouth daily.    [name] PARoxetine (PAXIL) 10 MG tablet Take 10 mg by mouth  daily.     No current facility-administered medications for this visit.    Allergies as of 06/28/2014 - Review Complete 06/28/2014  Allergen Reaction Noted  . Codeine Nausea And Vomiting 12/15/2010  . Erythromycin  03/04/2014  . Hydromorphone hcl Nausea And Vomiting 12/15/2010  . Nsaids  05/20/2013  . Penicillins Hives 12/15/2010  . Bentyl [dicyclomine hcl] Rash 08/22/2013  . Latex Rash 05/10/2013  . Tape Itching and Rash 05/10/2013    Family History  Problem Relation Age of Onset  . Colon cancer Maternal Grandmother   . Cervical cancer Mother   . Asthma    . Diabetes      History   Social History  . Marital Status: Married    Spouse Name: N/A  . Number of Children: N/A  . Years of Education: N/A   Social History Main Topics  . Smoking status: Current Every Day Smoker -- 1.00 packs/day for 11 years    Types: Cigarettes  . Smokeless tobacco: Not on file     Comment: Smokes one pack of cigarettes daily  . Alcohol Use: Yes     Comment: occasionally  . Drug Use: No  . Sexual Activity: Yes    Birth Control/ Protection: None   Other Topics Concern  . None   Social History Narrative    Review of Systems: As mentioned in HPI  Physical Exam: BP 116/82 mmHg  Pulse 79  Temp(Src) 96.5 F (35.8 C)  Ht 5\' 7"  (1.702 m)  Wt 219 lb 6.4 oz (  99.519 kg)  BMI 34.35 kg/m2  LMP 03/06/2012 General:   Alert and oriented. No distress noted. Pleasant and cooperative.  Head:  Normocephalic and atraumatic. Eyes:  Conjuctiva clear without scleral icterus. Mouth:  Oral mucosa pink and moist. Good dentition. No lesions. Abdomen:  +BS, soft, non-tender and non-distended. No rebound or guarding. No HSM or masses noted. Msk:  Symmetrical without gross deformities. Normal posture. Extremities:  Without edema. Neurologic:  Alert and  oriented x4;  grossly normal neurologically. Skin:  Intact without significant lesions or rashes. Psych:  Alert and cooperative. Normal mood and  affect.

## 2014-07-02 ENCOUNTER — Telehealth: Payer: Self-pay

## 2014-07-02 NOTE — Assessment & Plan Note (Signed)
Symptomatic GERD. Restart Dexilant.

## 2014-07-02 NOTE — Telephone Encounter (Signed)
Pt called and requested [place] results.

## 2014-07-02 NOTE — Assessment & Plan Note (Signed)
With thorough work-up as noted in HPI. Next colonoscopy due 2018. Trial of Viberzi 75 mg po BID. Will attempt patient assistance for this. Will arrange HBT as well once insurance completed.

## 2014-07-02 NOTE — Telephone Encounter (Signed)
Called patient and informed her of U/S results. Patient states "OK, so I do not have cancer." Informed patient that she does not have any masses. Asked when her last pap smear was. Patient states she's not sure-- its been awhile. Patient has no insurance. Gave her number for free pap screening [phone] and advised she schedule through this for a free pap. Patient verbalized understanding and gratitude. No further questions or concerns.

## 2014-07-02 NOTE — Progress Notes (Signed)
CC'ED TO PCP 

## 2014-07-08 LAB — SURGICAL PATHOLOGY

## 2014-07-09 NOTE — Telephone Encounter (Signed)
Called Patricia Rivers and left message that I am following up from her phone call from a few weeks ago. I wanted to let her know the doctor will address all her questions at her next visit and she be called with an appointment as discussed prior. Please call if other concerns.

## 2014-07-11 ENCOUNTER — Telehealth: Payer: Self-pay

## 2014-07-11 NOTE — Telephone Encounter (Signed)
Patient assistance forms and instructions have been mailed to the pt.

## 2014-07-14 NOTE — Op Note (Signed)
PATIENT NAME:  Patricia, Rivers MR#:  [id number] DATE OF BIRTH:  Aug 05, 1985  DATE OF PROCEDURE:  04/04/2014  PREOPERATIVE DIAGNOSIS: Biliary dyskinesia.   POSTOPERATIVE DIAGNOSIS: Biliary dyskinesia.   PROCEDURE PERFORMED: Laparoscopic cholecystectomy.   ANESTHESIA: General.   ESTIMATED BLOOD LOSS: 10 mL.   COMPLICATIONS: None.   SPECIMENS: Gallbladder.   INDICATION FOR SURGERY: [name] is a pleasant 29 year old female with history of months of epigastric right upper quadrant pain with negative work-up. She had a HIDA scan, which showed an ejection fraction of 24%. She was brought to the operating room for laparoscopic cholecystectomy.   DETAILS OF PROCEDURE: [name] was brought to the operating room suite. After informed consent was obtained, she was induced, endotracheal tube was placed, general anesthesia was administered. Her abdomen was prepped and draped in standard surgical fashion. A timeout was then performed, correctly identifying the patient name, operative site, and procedure to be performed. A supraumbilical incision was made, then deepened down to the fascia. The fascia was incised. The peritoneum entered and 2 stay sutures were placed to the fasciotomy. Hassan trocars placed in the abdomen. The abdomen was insufflated. An 11 mm epigastric and two 5 mm right subcostal trocars were placed. The gallbladder was then lifted up over the dome of the liver. The cystic duct and cystic artery were dissected out. Critical view was obtained. These were clipped and ligated. The gallbladder was then taken off the gallbladder fossa and brought out through an Endo Catch bag. Hemostasis was then obtained. After I was satisfied with hemostasis, irrigation was performed and the trocars were taken out under direct visualization. The abdomen was desufflated. The supraumbilical fascia was closed with a figure-of-eight 0 Vicryl, 4-0 Monocryl was used to close the skin. Then, Steri-Strips, Telfa gauze,  and Tegaderm then completed the dressing. The patient was then awoken, extubated, and brought to the postanesthesia care unit. There were no immediate complications. Needle, sponge, and instrument counts were correct at the end of the procedure.     ____________________________ Glena Norfolk. Rogelio Winbush, MD cal:mw D: 04/04/2014 13:27:33 ET T: 04/04/2014 14:28:05 ET JOB#: [id number]  cc: Harrell Gave A. Ersel Enslin, MD, <Dictator> Floyde Parkins MD ELECTRONICALLY SIGNED 04/16/2014 11:13

## 2014-08-27 ENCOUNTER — Emergency Department (HOSPITAL_COMMUNITY)
Admission: EM | Admit: 2014-08-27 | Discharge: 2014-08-27 | Disposition: A | Discharge status: Home / Self Care | Payer: Self-pay | Attending: Emergency Medicine | Admitting: Emergency Medicine

## 2014-08-27 ENCOUNTER — Encounter (HOSPITAL_COMMUNITY): Payer: Self-pay | Admitting: Emergency Medicine

## 2014-08-27 ENCOUNTER — Encounter: Payer: Self-pay | Admitting: Obstetrics & Gynecology

## 2014-08-27 ENCOUNTER — Ambulatory Visit (INDEPENDENT_AMBULATORY_CARE_PROVIDER_SITE_OTHER): Payer: Self-pay | Admitting: Family Medicine

## 2014-08-27 VITALS — BP 117/78 | HR 84 | Temp 98.2°F | Ht 67.0 in | Wt 221.6 lb

## 2014-08-27 DIAGNOSIS — Z880 Allergy status to penicillin: Secondary | ICD-10-CM | POA: Insufficient documentation

## 2014-08-27 DIAGNOSIS — Z3202 Encounter for pregnancy test, result negative: Secondary | ICD-10-CM | POA: Insufficient documentation

## 2014-08-27 DIAGNOSIS — R600 Localized edema: Secondary | ICD-10-CM | POA: Insufficient documentation

## 2014-08-27 DIAGNOSIS — Z86018 Personal history of other benign neoplasm: Secondary | ICD-10-CM | POA: Insufficient documentation

## 2014-08-27 DIAGNOSIS — Z91040 Latex allergy status: Secondary | ICD-10-CM | POA: Insufficient documentation

## 2014-08-27 DIAGNOSIS — Z8742 Personal history of other diseases of the female genital tract: Secondary | ICD-10-CM | POA: Insufficient documentation

## 2014-08-27 DIAGNOSIS — Z720 Tobacco use: Secondary | ICD-10-CM | POA: Insufficient documentation

## 2014-08-27 DIAGNOSIS — M545 Low back pain: Secondary | ICD-10-CM | POA: Insufficient documentation

## 2014-08-27 DIAGNOSIS — Z79899 Other long term (current) drug therapy: Secondary | ICD-10-CM | POA: Insufficient documentation

## 2014-08-27 DIAGNOSIS — Z87828 Personal history of other (healed) physical injury and trauma: Secondary | ICD-10-CM | POA: Insufficient documentation

## 2014-08-27 DIAGNOSIS — E2839 Other primary ovarian failure: Secondary | ICD-10-CM | POA: Insufficient documentation

## 2014-08-27 DIAGNOSIS — Z791 Long term (current) use of non-steroidal anti-inflammatories (NSAID): Secondary | ICD-10-CM | POA: Insufficient documentation

## 2014-08-27 DIAGNOSIS — Z8719 Personal history of other diseases of the digestive system: Secondary | ICD-10-CM | POA: Insufficient documentation

## 2014-08-27 DIAGNOSIS — E288 Other ovarian dysfunction: Secondary | ICD-10-CM

## 2014-08-27 DIAGNOSIS — Z86010 Personal history of colonic polyps: Secondary | ICD-10-CM | POA: Insufficient documentation

## 2014-08-27 HISTORY — DX: Other primary ovarian failure: E28.39

## 2014-08-27 LAB — COMPREHENSIVE METABOLIC PANEL
ALBUMIN: 3.9 g/dL (ref 3.5–5.0)
ALT: 28 U/L (ref 14–54)
AST: 23 U/L (ref 15–41)
Alkaline Phosphatase: 87 U/L (ref 38–126)
Anion gap: 5 (ref 5–15)
BUN: 14 mg/dL (ref 6–20)
CALCIUM: 9 mg/dL (ref 8.9–10.3)
CO2: 25 mmol/L (ref 22–32)
Chloride: 107 mmol/L (ref 101–111)
Creatinine, Ser: 0.81 mg/dL (ref 0.44–1.00)
GLUCOSE: 104 mg/dL — AB (ref 65–99)
POTASSIUM: 4.1 mmol/L (ref 3.5–5.1)
SODIUM: 137 mmol/L (ref 135–145)
Total Bilirubin: 0.7 mg/dL (ref 0.3–1.2)
Total Protein: 6.9 g/dL (ref 6.5–8.1)

## 2014-08-27 LAB — CBC WITH DIFFERENTIAL/PLATELET
BASOS ABS: 0 10*3/uL (ref 0.0–0.1)
Basophils Relative: 0 % (ref 0–1)
Eosinophils Absolute: 0.5 10*3/uL (ref 0.0–0.7)
Eosinophils Relative: 6 % — ABNORMAL HIGH (ref 0–5)
HCT: 44 % (ref 36.0–46.0)
Hemoglobin: 15.1 g/dL — ABNORMAL HIGH (ref 12.0–15.0)
LYMPHS PCT: 32 % (ref 12–46)
Lymphs Abs: 2.7 10*3/uL (ref 0.7–4.0)
MCH: 31.4 pg (ref 26.0–34.0)
MCHC: 34.3 g/dL (ref 30.0–36.0)
MCV: 91.5 fL (ref 78.0–100.0)
MONOS PCT: 6 % (ref 3–12)
Monocytes Absolute: 0.5 10*3/uL (ref 0.1–1.0)
NEUTROS ABS: 4.7 10*3/uL (ref 1.7–7.7)
Neutrophils Relative %: 56 % (ref 43–77)
PLATELETS: 198 10*3/uL (ref 150–400)
RBC: 4.81 MIL/uL (ref 3.87–5.11)
RDW: 12.7 % (ref 11.5–15.5)
WBC: 8.5 10*3/uL (ref 4.0–10.5)

## 2014-08-27 LAB — URINALYSIS, ROUTINE W REFLEX MICROSCOPIC
BILIRUBIN URINE: NEGATIVE
GLUCOSE, UA: NEGATIVE mg/dL
Hgb urine dipstick: NEGATIVE
KETONES UR: NEGATIVE mg/dL
Leukocytes, UA: NEGATIVE
Nitrite: NEGATIVE
Protein, ur: NEGATIVE mg/dL
Specific Gravity, Urine: >1.03 — ABNORMAL HIGH (ref 1.005–1.030)
Urobilinogen, UA: 1 mg/dL (ref 0.0–1.0)
pH: 5.5 (ref 5.0–8.0)

## 2014-08-27 LAB — POC URINE PREG, ED: Preg Test, Ur: NEGATIVE

## 2014-08-27 MED ORDER — FENTANYL CITRATE (PF) 100 MCG/2ML IJ SOLN
50.0000 ug | Freq: Once | INTRAMUSCULAR | Status: AC
  Administered 2014-08-27: 50 ug via INTRAVENOUS
  Filled 2014-08-27: qty 2

## 2014-08-27 MED ORDER — SODIUM CHLORIDE 0.9 % IV BOLUS (SEPSIS)
1000.0000 mL | Freq: Once | INTRAVENOUS | Status: AC
  Administered 2014-08-27: 1000 mL via INTRAVENOUS

## 2014-08-27 MED ORDER — TRAMADOL HCL 50 MG PO TABS: 50.0000 mg | ORAL_TABLET | Freq: Four times a day (QID) | ORAL | Status: DC | PRN

## 2014-08-27 MED ORDER — ESTRADIOL 0.1 MG/GM VA CREA: 1.0000 | TOPICAL_CREAM | Freq: Every day | VAGINAL | Status: DC

## 2014-08-27 MED ORDER — ONDANSETRON 4 MG PO TBDP: 4.0000 mg | ORAL_TABLET | Freq: Three times a day (TID) | ORAL | Status: DC | PRN

## 2014-08-27 MED ORDER — ONDANSETRON HCL 4 MG/2ML IJ SOLN
4.0000 mg | Freq: Once | INTRAMUSCULAR | Status: AC
  Administered 2014-08-27: 4 mg via INTRAVENOUS
  Filled 2014-08-27: qty 2

## 2014-08-27 NOTE — ED Notes (Signed)
Pt reports lower back pain,leg swelling and decreased urine output for last several weeks. Pt sent here by PCP.

## 2014-08-27 NOTE — ED Provider Notes (Signed)
TIME SEEN: 3:30 PM  CHIEF COMPLAINT: back pain, lower extremity swelling, decreased urination  HPI: Pt is a 29 y.o. with history of ovarian failure who does not have menstrual cycles presents to the emergency department with several days of diffuse lower back pain that radiates into her abdomen, bilateral lower extremity swelling and pain with bending her toes and decreased urination. She went and saw Dr. Deniece Ree today at [facility] clinic who sent her to the emergency department to be further evaluated. Patient has been on move it for the past 6 months but has no history of kidney disease. States she did urinate at 2 AM but has not urinated since. She states she's had vomiting and diarrhea. No fever. No dysuria or hematuria. No vaginal bleeding or discharge. No chest pain or shortness of breath.  Denies prior history of PE or DVT. No calf tenderness. No recent prolonged immobilization such as long flight or hospitals issue, fracture, surgery, trauma. Was prescribed estradiol vaginal cream today but is not on any other exogenous estrogen.  Denies history of CAD or CHF.  ROS: See HPI Constitutional: no fever  Eyes: no drainage  ENT: no runny nose   Cardiovascular:  no chest pain  Resp: no SOB  GI:  vomiting GU: no dysuria Integumentary: no rash  Allergy: no hives  Musculoskeletal: no leg swelling  Neurological: no slurred speech ROS otherwise negative  PAST MEDICAL HISTORY/PAST SURGICAL HISTORY:  Past Medical History  Diagnosis Date  . Right ankle injury   . Colon polyp   . Rectal polyp   . Carcinoid tumor of rectum   . 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 GERD (gastroesophageal reflux disease)   . Ovarian failure     MEDICATIONS:  Prior to Admission medications   Medication Sig Start Date End Date Taking? Authorizing Provider  estradiol (ESTRACE VAGINAL) 0.1 MG/GM vaginal cream Place 1 Applicatorful vaginally at bedtime. 08/27/14   Nila Nephew, MD   meloxicam (MOBIC) 15 MG tablet Take 15 mg by mouth daily.    Historical Provider, MD  PARoxetine (PAXIL) 10 MG tablet Take 10 mg by mouth daily.    Historical Provider, MD    ALLERGIES:  Allergies  Allergen Reactions  . Codeine Nausea And Vomiting  . Erythromycin     itching  . Hydromorphone Hcl Nausea And Vomiting  . Nsaids   . Penicillins Hives  . Bentyl [Dicyclomine Hcl] Rash    Drowsiness  . Latex Rash    Irritates skin  . Tape Itching and Rash    Clear tape, use paper tape    SOCIAL HISTORY:  History  Substance Use Topics  . Smoking status: Current Every Day Smoker -- 1.00 packs/day for 11 years    Types: Cigarettes  . Smokeless tobacco: Not on file     Comment: Smokes one pack of cigarettes daily  . Alcohol Use: Yes     Comment: occasionally    FAMILY HISTORY: Family History  Problem Relation Age of Onset  . Colon cancer Maternal Grandmother   . Cervical cancer Mother   . Asthma    . Diabetes      EXAM: BP 131/86 mmHg  Pulse 102  Temp(Src) 98 F (36.7 C) (Oral)  Resp 17  Ht 5' 7.5" (1.715 m)  Wt 231 lb (104.781 kg)  BMI 35.62 kg/m2  SpO2 99% CONSTITUTIONAL: Alert and oriented and responds appropriately to questions. Well-appearing; well-nourished, obese, in no distress HEAD: Normocephalic  EYES: Conjunctivae clear, PERRL ENT: normal nose; no rhinorrhea; moist mucous membranes; pharynx without lesions noted NECK: Supple, no meningismus, no LAD  CARD: RRR; S1 and S2 appreciated; no murmurs, no clicks, no rubs, no gallops RESP: Normal chest excursion without splinting or tachypnea; breath sounds clear and equal bilaterally; no wheezes, no rhonchi, no rales, no hypoxia or respiratory distress, speaking full sentences ABD/GI: Normal bowel sounds; non-distended; soft, non-tender, no rebound, no guarding, no peritoneal signs BACK:  The back appears normal and is non-tender to palpation, there is no CVA tenderness, no midline spinal tenderness or step-off or  deformity EXT: Normal ROM in all joints; non-tender to palpation; patient has bilateral nonpitting edema in her feet, 2+ DP pulses bilaterally, warm and well-perfused extremities; normal capillary refill; no cyanosis, no calf tenderness or swelling    SKIN: Normal color for age and race; warm NEURO: Moves all extremities equally, sensation to light touch intact diffusely, cranial nerves II through XII intact PSYCH: The patient's mood and manner are appropriate. Grooming and personal hygiene are appropriate.  MEDICAL DECISION MAKING: Patient here with nonspecific symptoms of lower back pain, leg swelling and decreased urination. Patient was seen at [facility] clinic today and they were concerned for possible acute renal given her history of Mobic use.  Patient was sent to the emergency department for further evaluation. Will obtain labs, urinalysis. We'll give IV fluids. She has no history of heart failure. Doubt DVT given no risk factors and swelling is equal bilaterally.  ED PROGRESS: Patient's labs are reassuring. Her creatinine is normal at 0.81, BUN 14. Her urine did show a specific gravity of greater than 1.030 but no ketones and no sign of infection. Her lungs are clear and she is not having shortness of breath or hypoxia. Unclear etiology for patient's symptoms but do not feel she needs further emergent workup today. Have advised her to follow-up with her primary care provider, keep her legs elevated and wear compression hose. Have discussed return precautions with her. She verbalized understanding and is comfortable with plan.      EKG Interpretation  Date/Time:  Tuesday August 27 2014 16:03:56 EDT Ventricular Rate:  81 PR Interval:  131 QRS Duration: 98 QT Interval:  376 QTC Calculation: 436 R Axis:   40 Text Interpretation:  Sinus rhythm Baseline wander in lead(s) V1 V2 V4 No significant change since last tracing Confirmed by WARD,  DO, KRISTEN [phone]) on 08/27/2014 4:24:39 PM         [place], DO 08/27/14 1707

## 2014-08-27 NOTE — ED Notes (Signed)
MD at bedside. 

## 2014-08-27 NOTE — Discharge Instructions (Signed)
Back Pain, Adult Low back pain is very common. About 1 in 5 people have back pain.The cause of low back pain is rarely dangerous. The pain often gets better over time.About half of people with a sudden onset of back pain feel better in just 2 weeks. About 8 in 10 people feel better by 6 weeks.  CAUSES Some common causes of back pain include:  Strain of the muscles or ligaments supporting the spine.  Wear and tear (degeneration) of the spinal discs.  Arthritis.  Direct injury to the back. DIAGNOSIS Most of the time, the direct cause of low back pain is not known.However, back pain can be treated effectively even when the exact cause of the pain is unknown.Answering your caregiver's questions about your overall health and symptoms is one of the most accurate ways to make sure the cause of your pain is not dangerous. If your caregiver needs more information, he or she may order lab work or imaging tests (X-rays or MRIs).However, even if imaging tests show changes in your back, this usually does not require surgery. HOME CARE INSTRUCTIONS For many people, back pain returns.Since low back pain is rarely dangerous, it is often a condition that people can learn to [facility] their own.   Remain active. It is stressful on the back to sit or stand in one place. Do not sit, drive, or stand in one place for more than 30 minutes at a time. Take short walks on level surfaces as soon as pain allows.Try to increase the length of time you walk each day.  Do not stay in bed.Resting more than 1 or 2 days can delay your recovery.  Do not avoid exercise or work.Your body is made to move.It is not dangerous to be active, even though your back may hurt.Your back will likely heal faster if you return to being active before your pain is gone.  Pay attention to your body when you bend and lift. Many people have less discomfortwhen lifting if they bend their knees, keep the load close to their bodies,and  avoid twisting. Often, the most comfortable positions are those that put less stress on your recovering back.  Find a comfortable position to sleep. Use a firm mattress and lie on your side with your knees slightly bent. If you lie on your back, put a pillow under your knees.  Only take over-the-counter or prescription medicines as directed by your caregiver. Over-the-counter medicines to reduce pain and inflammation are often the most helpful.Your caregiver may prescribe muscle relaxant drugs.These medicines help dull your pain so you can more quickly return to your normal activities and healthy exercise.  Put ice on the injured area.  Put ice in a plastic bag.  Place a towel between your skin and the bag.  Leave the ice on for 15-20 minutes, 03-04 times a day for the first 2 to 3 days. After that, ice and heat may be alternated to reduce pain and spasms.  Ask your caregiver about trying back exercises and gentle massage. This may be of some benefit.  Avoid feeling anxious or stressed.Stress increases muscle tension and can worsen back pain.It is important to recognize when you are anxious or stressed and learn ways to manage it.Exercise is a great option. SEEK MEDICAL CARE IF:  You have pain that is not relieved with rest or medicine.  You have pain that does not improve in 1 week.  You have new symptoms.  You are generally not feeling well. SEEK  IMMEDIATE MEDICAL CARE IF:   You have pain that radiates from your back into your legs.  You develop new bowel or bladder control problems.  You have unusual weakness or numbness in your arms or legs.  You develop nausea or vomiting.  You develop abdominal pain.  You feel faint. Document Released: 03/01/2005 Document Revised: 08/31/2011 Document Reviewed: 07/03/2013 [facility] Patient Information 2015 [place], [place]. This information is not intended to replace advice given to you by your health care provider. Make sure you  discuss any questions you have with your health care provider.  Edema Edema is an abnormal buildup of fluids in your bodytissues. Edema is somewhatdependent on gravity to pull the fluid to the lowest place in your body. That makes the condition more common in the legs and thighs (lower extremities). Painless swelling of the feet and ankles is common and becomes more likely as you get older. It is also common in looser tissues, like around your eyes.  When the affected area is squeezed, the fluid may move out of that spot and leave a dent for a few moments. This dent is called pitting.  CAUSES  There are many possible causes of edema. Eating too much salt and being on your feet or sitting for a long time can cause edema in your legs and ankles. Hot weather may make edema worse. Common medical causes of edema include:  Heart failure.  Liver disease.  Kidney disease.  Weak blood vessels in your legs.  Cancer.  An injury.  Pregnancy.  Some medications.  Obesity. SYMPTOMS  Edema is usually painless.Your skin may look swollen or shiny.  DIAGNOSIS  Your health care provider may be able to diagnose edema by asking about your medical history and doing a physical exam. You may need to have tests such as X-rays, an electrocardiogram, or blood tests to check for medical conditions that may cause edema.  TREATMENT  Edema treatment depends on the cause. If you have heart, liver, or kidney disease, you need the treatment appropriate for these conditions. General treatment may include:  Elevation of the affected body part above the level of your heart.  Compression of the affected body part. Pressure from elastic bandages or support stockings squeezes the tissues and forces fluid back into the blood vessels. This keeps fluid from entering the tissues.  Restriction of fluid and salt intake.  Use of a water pill (diuretic). These medications are appropriate only for some types of edema. They  pull fluid out of your body and make you urinate more often. This gets rid of fluid and reduces swelling, but diuretics can have side effects. Only use diuretics as directed by your health care provider. HOME CARE INSTRUCTIONS   Keep the affected body part above the level of your heart when you are lying down.   Do not sit still or stand for prolonged periods.   Do not put anything directly under your knees when lying down.  Do not wear constricting clothing or garters on your upper legs.   Exercise your legs to work the fluid back into your blood vessels. This may help the swelling go down.   Wear elastic bandages or support stockings to reduce ankle swelling as directed by your health care provider.   Eat a low-salt diet to reduce fluid if your health care provider recommends it.   Only take medicines as directed by your health care provider. SEEK MEDICAL CARE IF:   Your edema is not responding to  treatment.  You have heart, liver, or kidney disease and notice symptoms of edema.  You have edema in your legs that does not improve after elevating them.   You have sudden and unexplained weight gain. SEEK IMMEDIATE MEDICAL CARE IF:   You develop shortness of breath or chest pain.   You cannot breathe when you lie down.  You develop pain, redness, or warmth in the swollen areas.   You have heart, liver, or kidney disease and suddenly get edema.  You have a fever and your symptoms suddenly get worse. MAKE SURE YOU:   Understand these instructions.  Will watch your condition.  Will get help right away if you are not doing well or get worse. Document Released: 03/01/2005 Document Revised: 07/16/2013 Document Reviewed: 12/22/2012 [facility] Patient Information 2015 [place], [place]. This information is not intended to replace advice given to you by your health care provider. Make sure you discuss any questions you have with your health care provider.

## 2014-08-27 NOTE — Patient Instructions (Signed)
Today patient presented for follow up and reported decreased urine output, back pain and lower extremity swelling x 1 week.  Takes Mobic on daily basis. - advised patient to follow up with PCP, urgent care or Emergency room to evaluate for acute kidney failure.    Estradiol vaginal cream What is this medicine? ESTRADIOL (es tra DYE ole) contains the female hormone estrogen. It is used for symptoms of menopause, like vaginal dryness and irritation. This medicine may be used for other purposes; ask your health care provider or pharmacist if you have questions. COMMON BRAND NAME(S): Estrace What should I tell my health care provider before I take this medicine? They need to know if you have any of these conditions: -abnormal vaginal bleeding -blood vessel disease or blood clots -breast, cervical, endometrial, ovarian, liver, or uterine cancer -dementia -diabetes -gallbladder disease -heart disease or recent heart attack -high blood pressure -high cholesterol -high levels of calcium in the blood -hysterectomy -kidney disease -liver disease -migraine headaches -protein C deficiency -protein S deficiency -stroke -systemic lupus erythematosus (SLE) -tobacco smoker -an unusual or allergic reaction to estrogens, other hormones, soy, other medicines, foods, dyes, or preservatives -pregnant or trying to get pregnant -breast-feeding How should I use this medicine? This medicine is for use in the vagina only. Do not take by mouth. Follow the directions on the prescription label. Read package directions carefully before using. Use the special applicator supplied with the cream. Wash hands before and after use. Fill the applicator with the prescribed amount of cream. Lie on your back, part and bend your knees. Insert the applicator into the vagina and push the plunger to expel the cream into the vagina. Wash the applicator with warm soapy water and rinse well. Use exactly as directed for the  complete length of time prescribed. Do not stop using except on the advice of your doctor or health care professional. A patient package insert for the product will be given with each prescription and refill. Read this sheet carefully each time. The sheet may change frequently. Talk to your pediatrician regarding the use of this medicine in children. This medicine is not approved for use in children. Overdosage: If you think you have taken too much of this medicine contact a poison control center or emergency room at once. NOTE: This medicine is only for you. Do not share this medicine with others. What if I miss a dose? If you miss a dose, use it as soon as you can. If it is almost time for your next dose, use only that dose. Do not use double or extra doses. What may interact with this medicine? Do not take this medicine with any of the following medications: -aromatase inhibitors like aminoglutethimide, anastrozole, exemestane, letrozole, testolactone This medicine may also interact with the following medications: -barbiturates used for inducing sleep or treating seizures -carbamazepine -grapefruit juice -medicines for fungal infections like ketoconazole and itraconazole -raloxifene -rifabutin -rifampin -rifapentine -ritonavir -some antibiotics used to treat infections -St. John's Wort -tamoxifen -warfarin This list may not describe all possible interactions. Give your health care provider a list of all the medicines, herbs, non-prescription drugs, or dietary supplements you use. Also tell them if you smoke, drink alcohol, or use illegal drugs. Some items may interact with your medicine. What should I watch for while using this medicine? Visit your health care professional for regular checks on your progress. You will need a regular breast and pelvic exam. You should also discuss the need for regular mammograms with  your health care professional, and follow his or her guidelines. This  medicine can make your body retain fluid, making your fingers, hands, or ankles swell. Your blood pressure can go up. Contact your doctor or health care professional if you feel you are retaining fluid. If you have any reason to think you are pregnant, stop taking this medicine at once and contact your doctor or health care professional. Tobacco smoking increases the risk of getting a blood clot or having a stroke, especially if you are more than 29 years old. You are strongly advised not to smoke. If you wear contact lenses and notice visual changes, or if the lenses begin to feel uncomfortable, consult your eye care specialist. If you are going to have elective surgery, you may need to stop taking this medicine beforehand. Consult your health care professional for advice prior to scheduling the surgery. What side effects may I notice from receiving this medicine? Side effects that you should report to your doctor or health care professional as soon as possible: -allergic reactions like skin rash, itching or hives, swelling of the face, lips, or tongue -breast tissue changes or discharge -changes in vision -chest pain -confusion, trouble speaking or understanding -dark urine -general ill feeling or flu-like symptoms -light-colored stools -nausea, vomiting -pain, swelling, warmth in the leg -right upper belly pain -severe headaches -shortness of breath -sudden numbness or weakness of the face, arm or leg -trouble walking, dizziness, loss of balance or coordination -unusual vaginal bleeding -yellowing of the eyes or skin Side effects that usually do not require medical attention (report to your doctor or health care professional if they continue or are bothersome): -hair loss -increased hunger or thirst -increased urination -symptoms of vaginal infection like itching, irritation or unusual discharge -unusually weak or tired This list may not describe all possible side effects. Call your  doctor for medical advice about side effects. You may report side effects to FDA at 1-800-FDA-1088. Where should I keep my medicine? Keep out of the reach of children. Store at room temperature between 15 and 30 degrees C (59 and 86 degrees F). Protect from temperatures above 40 degrees C (104 degrees C). Do not freeze. Throw away any unused medicine after the expiration date. NOTE: This sheet is a summary. It may not cover all possible information. If you have questions about this medicine, talk to your doctor, pharmacist, or health care provider.  2015, [employer] Standard. (2010-06-03 09:18:12)

## 2014-08-27 NOTE — Progress Notes (Signed)
   Subjective:    Patient ID: Patricia Rivers, female    DOB: 05-18-1985, 29 y.o.   MRN: [id number]  HPI  29yo G0P0 here for follow up of [facility] which was 94 and transvaginal sono which showed 1.1cm fibroid. - has decreased sex drive, vaginal dryness and hot flashes Also reports lower extremity swelling, back pain, and DECREASED urine output.  Takes Mobic daily.  Does not take any other NSAIDS.   Fam hx: mother cervical cancer, father skin cancer, grandmother colon cancer - no hx of breast cancer or ovarian cancer  Social hx: current smoker 1.5packs/day   Review of Systems  Constitutional: Positive for diaphoresis. Negative for fever and chills.  Genitourinary: Positive for frequency (decreased) and decreased urine volume. Negative for dysuria and difficulty urinating.       Decreased sex drive, vaginal dryness  Skin: Negative for color change and pallor.       Objective:   Physical Exam  Constitutional: She appears well-developed and well-nourished. No distress.  HENT:  Mouth/Throat: Mucous membranes are moist. Pharynx is normal.  Eyes: Conjunctivae and EOM are normal.  Neck: No adenopathy.  Cardiovascular: Normal rate and S2 normal.   Pulmonary/Chest: Effort normal and breath sounds normal.  Abdominal: She exhibits no distension. There is no tenderness.  Musculoskeletal: Normal range of motion.  Neurological: She is alert. No cranial nerve deficit. Coordination normal.  Skin: Skin is warm. No rash noted. She is not diaphoretic. No pallor.     06/19/2014 15:57  Yeast Wet Prep HPF POC NONE SEEN  Trich, Wet Prep NONE SEEN  Clue Cells Wet Prep HPF POC NONE SEEN  WBC, Wet Prep HPF POC NONE SEEN      06/19/2014 16:58  FSH 94.3  TSH 2.095   TECHNIQUE: Both transabdominal and transvaginal ultrasound examinations of the pelvis were performed. Transabdominal technique was performed for global imaging of the pelvis including uterus, ovaries, adnexal regions, and pelvic  cul-de-sac. It was necessary to proceed with endovaginal exam following the transabdominal exam to visualize the endometrial stripe and ovaries.  COMPARISON: CT on 11/05/2013  FINDINGS: Uterus  Measurements: 5.9 x 2.9 x 4.0 cm. A small intramural fibroid is seen in the left anterior corpus measuring 1.1 cm in maximum diameter. A probable second tiny less than 5 mm fibroid is also noted.  Endometrium  Thickness: 3 mm. No focal abnormality visualized.  Right ovary  Measurements: 2.2 x 1.0 x 1.7 cm. Normal postmenopausal appearance/no adnexal mass.  Left ovary  Measurements: 1.7 x 2.0 x 1.3 cm. Normal postmenopausal appearance/no adnexal mass.  Other findings  No free fluid.  IMPRESSION: Tiny uterine fibroids, largest measuring 1.1 cm.  Normal postmenopausal appearance of both ovaries. No adnexal mass visualized.    Assessment & Plan:   29 yo G0P0 here for follow up of labs and ultrasound with premature ovarian failure - rx estrace cream PV, discussed how to taper - advised to stop smoking, f/u in 2 months, if stops smoking by then potentially candidate for HRT - start vitamin D/calcium supplement, such as Citracal or other OTC supplements.  Emphasized importance of osteoporosis prevention. - referred to ER/urgent care/PCP for LE swelling and decreased urine output in light of daily Mobic usage to rule out acute renal failure.  Merla Riches, MD 1:42 PM

## 2014-09-02 ENCOUNTER — Telehealth: Payer: Self-pay | Admitting: *Deleted

## 2014-09-02 NOTE — Telephone Encounter (Signed)
Called patient and a woman answered stating she wasn't in at the moment. Told her to let her know her doctor's office is trying to return her phone call, she stated that she would

## 2014-09-02 NOTE — Telephone Encounter (Signed)
Pt contacted the clinic to request advice concerning vaginal cream.

## 2014-09-03 NOTE — Telephone Encounter (Signed)
Contacted pharmacy and pharmacist stated they can compound the medication for the cost of $50.00. Notified patient of new cost of medication. Pt verbalizes understanding.

## 2014-09-08 ENCOUNTER — Encounter (HOSPITAL_COMMUNITY): Payer: Self-pay | Admitting: *Deleted

## 2014-09-08 ENCOUNTER — Emergency Department (HOSPITAL_COMMUNITY)
Admission: EM | Admit: 2014-09-08 | Discharge: 2014-09-09 | Disposition: A | Discharge status: Home / Self Care | Payer: Self-pay | Attending: Emergency Medicine | Admitting: Emergency Medicine

## 2014-09-08 DIAGNOSIS — R1110 Vomiting, unspecified: Secondary | ICD-10-CM | POA: Insufficient documentation

## 2014-09-08 DIAGNOSIS — Z85040 Personal history of malignant carcinoid tumor of rectum: Secondary | ICD-10-CM | POA: Insufficient documentation

## 2014-09-08 DIAGNOSIS — Z8639 Personal history of other endocrine, nutritional and metabolic disease: Secondary | ICD-10-CM | POA: Insufficient documentation

## 2014-09-08 DIAGNOSIS — Z87828 Personal history of other (healed) physical injury and trauma: Secondary | ICD-10-CM | POA: Insufficient documentation

## 2014-09-08 DIAGNOSIS — Z8719 Personal history of other diseases of the digestive system: Secondary | ICD-10-CM | POA: Insufficient documentation

## 2014-09-08 DIAGNOSIS — Z880 Allergy status to penicillin: Secondary | ICD-10-CM | POA: Insufficient documentation

## 2014-09-08 DIAGNOSIS — Z79899 Other long term (current) drug therapy: Secondary | ICD-10-CM | POA: Insufficient documentation

## 2014-09-08 DIAGNOSIS — R1111 Vomiting without nausea: Secondary | ICD-10-CM

## 2014-09-08 DIAGNOSIS — Z720 Tobacco use: Secondary | ICD-10-CM | POA: Insufficient documentation

## 2014-09-08 DIAGNOSIS — R197 Diarrhea, unspecified: Secondary | ICD-10-CM | POA: Insufficient documentation

## 2014-09-08 DIAGNOSIS — Z792 Long term (current) use of antibiotics: Secondary | ICD-10-CM | POA: Insufficient documentation

## 2014-09-08 DIAGNOSIS — Z86010 Personal history of colonic polyps: Secondary | ICD-10-CM | POA: Insufficient documentation

## 2014-09-08 LAB — URINALYSIS, ROUTINE W REFLEX MICROSCOPIC
GLUCOSE, UA: NEGATIVE mg/dL
KETONES UR: 15 mg/dL — AB
LEUKOCYTES UA: NEGATIVE
Nitrite: NEGATIVE
Specific Gravity, Urine: >1.03 — ABNORMAL HIGH (ref 1.005–1.030)
Urobilinogen, UA: 0.2 mg/dL (ref 0.0–1.0)
pH: 5.5 (ref 5.0–8.0)

## 2014-09-08 LAB — BASIC METABOLIC PANEL
Anion gap: 9 (ref 5–15)
BUN: 16 mg/dL (ref 6–20)
CO2: 20 mmol/L — ABNORMAL LOW (ref 22–32)
CREATININE: 0.9 mg/dL (ref 0.44–1.00)
Calcium: 8.7 mg/dL — ABNORMAL LOW (ref 8.9–10.3)
Chloride: 104 mmol/L (ref 101–111)
GFR calc Af Amer: >60 mL/min (ref >60)
GFR calc non Af Amer: >60 mL/min (ref >60)
GLUCOSE: 91 mg/dL (ref 65–99)
Potassium: 3.6 mmol/L (ref 3.5–5.1)
Sodium: 133 mmol/L — ABNORMAL LOW (ref 135–145)

## 2014-09-08 LAB — CBC WITH DIFFERENTIAL/PLATELET
BASOS ABS: 0 10*3/uL (ref 0.0–0.1)
BASOS PCT: 0 % (ref 0–1)
EOS PCT: 1 % (ref 0–5)
Eosinophils Absolute: 0.1 10*3/uL (ref 0.0–0.7)
HEMATOCRIT: 43.5 % (ref 36.0–46.0)
HEMOGLOBIN: 15.3 g/dL — AB (ref 12.0–15.0)
Lymphocytes Relative: 29 % (ref 12–46)
Lymphs Abs: 2 10*3/uL (ref 0.7–4.0)
MCH: 31 pg (ref 26.0–34.0)
MCHC: 35.2 g/dL (ref 30.0–36.0)
MCV: 88.1 fL (ref 78.0–100.0)
MONO ABS: 0.8 10*3/uL (ref 0.1–1.0)
Monocytes Relative: 12 % (ref 3–12)
NEUTROS ABS: 4 10*3/uL (ref 1.7–7.7)
Neutrophils Relative %: 58 % (ref 43–77)
PLATELETS: 147 10*3/uL — AB (ref 150–400)
RBC: 4.94 MIL/uL (ref 3.87–5.11)
RDW: 12.5 % (ref 11.5–15.5)
WBC: 6.9 10*3/uL (ref 4.0–10.5)

## 2014-09-08 LAB — URINE MICROSCOPIC-ADD ON

## 2014-09-08 MED ORDER — ONDANSETRON HCL 4 MG/2ML IJ SOLN
4.0000 mg | Freq: Once | INTRAMUSCULAR | Status: AC
  Administered 2014-09-08: 4 mg via INTRAVENOUS
  Filled 2014-09-08: qty 2

## 2014-09-08 MED ORDER — MORPHINE SULFATE 4 MG/ML IJ SOLN
4.0000 mg | INTRAMUSCULAR | Status: DC | PRN
  Administered 2014-09-08: 4 mg via INTRAVENOUS
  Filled 2014-09-08: qty 1

## 2014-09-08 MED ORDER — SODIUM CHLORIDE 0.9 % IV SOLN
Freq: Once | INTRAVENOUS | Status: AC
  Administered 2014-09-08: via INTRAVENOUS

## 2014-09-08 MED ORDER — SODIUM CHLORIDE 0.9 % IV BOLUS (SEPSIS)
1000.0000 mL | Freq: Once | INTRAVENOUS | Status: AC
  Administered 2014-09-08: 1000 mL via INTRAVENOUS

## 2014-09-08 MED ORDER — SULFAMETHOXAZOLE-TRIMETHOPRIM 800-160 MG PO TABS: 1.0000 | ORAL_TABLET | Freq: Two times a day (BID) | ORAL | Status: AC

## 2014-09-08 MED ORDER — ONDANSETRON 4 MG PO TBDP: 4.0000 mg | ORAL_TABLET | Freq: Three times a day (TID) | ORAL | Status: DC | PRN

## 2014-09-08 MED ORDER — DEXTROSE 5 % IV SOLN
1.0000 g | Freq: Once | INTRAVENOUS | Status: AC
  Administered 2014-09-08: 1 g via INTRAVENOUS
  Filled 2014-09-08: qty 10

## 2014-09-08 MED ORDER — HYDROCODONE-ACETAMINOPHEN 5-325 MG PO TABS: 1.0000 | ORAL_TABLET | ORAL | Status: DC | PRN

## 2014-09-08 NOTE — ED Provider Notes (Signed)
CSN: [id number]     Arrival date & time 09/08/14  2045 History   This chart was scribed for Tanna Furry, MD by Forrestine Him, ED Scribe. This patient was seen in room APA03/APA03 and the patient's care was started 9:08 PM.   Chief Complaint  Patient presents with  . Abdominal Pain   The history is provided by the patient. No language interpreter was used.    HPI Comments: ALINA GILKEY is a 29 y.o. female who presents to the Emergency Department complaining of constant, ongoing, unchanged abdominal pain x 3 days ago. She also reports ongoing nausea, vomiting, and diarrhea. She is unable to keep any solid foods or liquids down at this time. No OTC medications or homer remedies attempted prior to arrival. No recent fever, chills, shortness or breath, or chest pain. Pt was seen at [facility] 6/20 and diagnosed with a bladder infection. At time of visit, Ms. Lannom was unable to urinate resulting in urinary catheterization. Pt was also sent home with an antibiotic which she started 4 days ago. Urinary symptoms have improved and she is now able to urinate without difficulty. No recent chills, chest pain, or shortness of breath. Pt with known allergies to Codeine, Erythromycin, Hydromorphone hcl,  Nsaids, Penicillins, and Bentyl.  Past Medical History  Diagnosis Date  . Right ankle injury   . Colon polyp   . Rectal polyp   . Carcinoid tumor of rectum   . 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 GERD (gastroesophageal reflux disease)   . Ovarian failure    Past Surgical History  Procedure Laterality Date  . Colonoscopy with esophagogastroduodenoscopy (egd) N/A 05/16/2013    Dr. Gala Romney: rectal polyp with low grade carcinoid tumor, colon polyp with tubulovillous adenoma with high grade dysplasia. Needs repeat colonoscopy in 3 months. EGD with antral erosions, duodenal bulb erosions, superficial ulceration. path with mild chronic gastritis   . Colonoscopy with propofol N/A 09/06/2013     SWF:UXNATF ileocolonoscopy. No evidence  of residual polyp anywhere. Next TCS 08/2016.  [name] Cholecystectomy     Family History  Problem Relation Age of Onset  . Colon cancer Maternal Grandmother   . Cervical cancer Mother   . Asthma    . Diabetes     History  Substance Use Topics  . Smoking status: Current Every Day Smoker -- 1.00 packs/day for 11 years    Types: Cigarettes  . Smokeless tobacco: Not on file     Comment: Smokes one pack of cigarettes daily  . Alcohol Use: Yes     Comment: occasionally   OB History    No data available     Review of Systems  Constitutional: Negative for fever, chills, diaphoresis, appetite change and fatigue.  HENT: Negative for mouth sores, sore throat and trouble swallowing.   Eyes: Negative for visual disturbance.  Respiratory: Negative for cough, chest tightness, shortness of breath and wheezing.   Cardiovascular: Negative for chest pain.  Gastrointestinal: Positive for nausea, vomiting, abdominal pain and diarrhea. Negative for abdominal distention.  Endocrine: Negative for polydipsia, polyphagia and polyuria.  Genitourinary: Negative for dysuria, frequency and hematuria.  Musculoskeletal: Negative for gait problem.  Skin: Negative for color change, pallor and rash.  Neurological: Negative for dizziness, syncope, light-headedness and headaches.  Hematological: Does not bruise/bleed easily.  Psychiatric/Behavioral: Negative for behavioral problems and confusion.      Allergies  Codeine; Erythromycin; Hydromorphone hcl; Nsaids; Penicillins; Bentyl; Latex; and Tape  Home Medications   Prior to Admission medications   Medication Sig Start Date End Date Taking? Authorizing Provider  cephALEXin (KEFLEX) 500 MG capsule Take 500 mg by mouth 3 (three) times daily.   Yes Historical Provider, MD  PARoxetine (PAXIL) 20 MG tablet Take 20 mg by mouth daily.   Yes Historical Provider, MD  HYDROcodone-acetaminophen (NORCO/VICODIN) 5-325 MG per  tablet Take 1 tablet by mouth every 4 (four) hours as needed. 09/08/14   Tanna Furry, MD  ondansetron (ZOFRAN ODT) 4 MG disintegrating tablet Take 1 tablet (4 mg total) by mouth every 8 (eight) hours as needed for nausea. 09/08/14   Tanna Furry, MD  sulfamethoxazole-trimethoprim (BACTRIM DS,SEPTRA DS) 800-160 MG per tablet Take 1 tablet by mouth 2 (two) times daily. 09/08/14 09/15/14  Tanna Furry, MD  traMADol (ULTRAM) 50 MG tablet Take 1 tablet (50 mg total) by mouth every 6 (six) hours as needed. 08/27/14   Kristen N Ward, DO   Triage Vitals: BP 108/81 mmHg  Pulse 85  Temp(Src) 97.8 F (36.6 C) (Oral)  Resp 24  SpO2 96%   Physical Exam  Constitutional: She is oriented to person, place, and time. She appears well-developed and well-nourished. No distress.  HENT:  Head: Normocephalic.  Eyes: Conjunctivae are normal. Pupils are equal, round, and reactive to light. No scleral icterus.  Neck: Normal range of motion. Neck supple. No thyromegaly present.  Cardiovascular: Normal rate and regular rhythm.  Exam reveals no gallop and no friction rub.   No murmur heard. Pulmonary/Chest: Effort normal and breath sounds normal. No respiratory distress. She has no wheezes. She has no rales.  Abdominal: Soft. Bowel sounds are normal. She exhibits no distension. There is tenderness. There is no rebound.  Mediate tenderness in lower abdomen  Musculoskeletal: Normal range of motion.  Neurological: She is alert and oriented to person, place, and time.  Skin: Skin is warm and dry. No rash noted.  Psychiatric: She has a normal mood and affect. Her behavior is normal.    ED Course  Procedures (including critical care time)  DIAGNOSTIC STUDIES: Oxygen Saturation is 96% on RA, adequate by my interpretation.    COORDINATION OF CARE: 9:13 PM- Will give Zofran, fluids, and Morphine. Will order CBC, CMP, urinalysis, and urine culture. Discussed treatment plan with pt at bedside and pt agreed to plan.     Labs  Review Labs Reviewed  CBC WITH DIFFERENTIAL/PLATELET - Abnormal; Notable for the following:    Hemoglobin 15.3 (*)    Platelets 147 (*)    All other components within normal limits  BASIC METABOLIC PANEL - Abnormal; Notable for the following:    Sodium 133 (*)    CO2 20 (*)    Calcium 8.7 (*)    All other components within normal limits  URINALYSIS, ROUTINE W REFLEX MICROSCOPIC (NOT AT [facility]) - Abnormal; Notable for the following:    APPearance CLOUDY (*)    Specific Gravity, Urine >1.030 (*)    Hgb urine dipstick TRACE (*)    Bilirubin Urine SMALL (*)    Ketones, ur 15 (*)    Protein, ur TRACE (*)    All other components within normal limits  URINE MICROSCOPIC-ADD ON - Abnormal; Notable for the following:    Squamous Epithelial / LPF MANY (*)    Bacteria, UA MANY (*)    All other components within normal limits  URINE CULTURE    Imaging Review No results found.   EKG Interpretation None  MDM   Final diagnoses:  Non-intractable vomiting without nausea, vomiting of unspecified type   Patient still has some cells in her urine but no bacteria. Culture pending. She seems to not tolerate the cephalexin by mouth with only gastric upset it's given her. We'll stop the cephalexin in favor of Bactrim. Given IV Rocephin tonight. Culture pending.  I personally performed the services described in this documentation, which was scribed in my presence. The recorded information has been reviewed and is accurate.   Tanna Furry, MD 09/08/14 [phone]

## 2014-09-08 NOTE — ED Notes (Signed)
Pt being treated for UTI. Reports abdominal pain w/ N/V. Pain now moving to the epigastric region.

## 2014-09-08 NOTE — Discharge Instructions (Signed)
Stop taking cephalexin.  On Tuesday morning started taking Bactrim.  Follow up with your primary care physician for culture results.

## 2014-09-10 LAB — URINE CULTURE

## 2014-10-17 ENCOUNTER — Encounter (HOSPITAL_COMMUNITY): Payer: Self-pay | Admitting: Emergency Medicine

## 2014-10-17 ENCOUNTER — Emergency Department (HOSPITAL_COMMUNITY): Payer: Self-pay

## 2014-10-17 ENCOUNTER — Telehealth: Payer: Self-pay | Admitting: Orthopedic Surgery

## 2014-10-17 ENCOUNTER — Emergency Department (HOSPITAL_COMMUNITY)
Admission: EM | Admit: 2014-10-17 | Discharge: 2014-10-17 | Disposition: A | Discharge status: Home / Self Care | Payer: Self-pay | Attending: Emergency Medicine | Admitting: Emergency Medicine

## 2014-10-17 DIAGNOSIS — Z8742 Personal history of other diseases of the female genital tract: Secondary | ICD-10-CM | POA: Insufficient documentation

## 2014-10-17 DIAGNOSIS — Z79899 Other long term (current) drug therapy: Secondary | ICD-10-CM | POA: Insufficient documentation

## 2014-10-17 DIAGNOSIS — Z880 Allergy status to penicillin: Secondary | ICD-10-CM | POA: Insufficient documentation

## 2014-10-17 DIAGNOSIS — Z86010 Personal history of colonic polyps: Secondary | ICD-10-CM | POA: Insufficient documentation

## 2014-10-17 DIAGNOSIS — W1839XA Other fall on same level, initial encounter: Secondary | ICD-10-CM | POA: Insufficient documentation

## 2014-10-17 DIAGNOSIS — Z720 Tobacco use: Secondary | ICD-10-CM | POA: Insufficient documentation

## 2014-10-17 DIAGNOSIS — M25561 Pain in right knee: Secondary | ICD-10-CM

## 2014-10-17 DIAGNOSIS — Y998 Other external cause status: Secondary | ICD-10-CM | POA: Insufficient documentation

## 2014-10-17 DIAGNOSIS — Z8719 Personal history of other diseases of the digestive system: Secondary | ICD-10-CM | POA: Insufficient documentation

## 2014-10-17 DIAGNOSIS — Y9389 Activity, other specified: Secondary | ICD-10-CM | POA: Insufficient documentation

## 2014-10-17 DIAGNOSIS — Z91040 Latex allergy status: Secondary | ICD-10-CM | POA: Insufficient documentation

## 2014-10-17 DIAGNOSIS — R200 Anesthesia of skin: Secondary | ICD-10-CM

## 2014-10-17 DIAGNOSIS — S8991XA Unspecified injury of right lower leg, initial encounter: Secondary | ICD-10-CM | POA: Insufficient documentation

## 2014-10-17 DIAGNOSIS — Z86018 Personal history of other benign neoplasm: Secondary | ICD-10-CM | POA: Insufficient documentation

## 2014-10-17 DIAGNOSIS — S99921A Unspecified injury of right foot, initial encounter: Secondary | ICD-10-CM | POA: Insufficient documentation

## 2014-10-17 DIAGNOSIS — Y9289 Other specified places as the place of occurrence of the external cause: Secondary | ICD-10-CM | POA: Insufficient documentation

## 2014-10-17 MED ORDER — PREDNISONE 10 MG PO TABS: ORAL_TABLET | ORAL | Status: DC

## 2014-10-17 MED ORDER — PREDNISONE 50 MG PO TABS
60.0000 mg | ORAL_TABLET | Freq: Once | ORAL | Status: AC
  Administered 2014-10-17: 60 mg via ORAL
  Filled 2014-10-17 (×2): qty 1

## 2014-10-17 MED ORDER — HYDROCODONE-ACETAMINOPHEN 5-325 MG PO TABS: 1.0000 | ORAL_TABLET | ORAL | Status: DC | PRN

## 2014-10-17 MED ORDER — HYDROCODONE-ACETAMINOPHEN 5-325 MG PO TABS
1.0000 | ORAL_TABLET | Freq: Once | ORAL | Status: AC
  Administered 2014-10-17: 1 via ORAL
  Filled 2014-10-17: qty 1

## 2014-10-17 NOTE — ED Notes (Signed)
Pt verbalized understanding of no driving and to use caution within 4 hours of taking pain meds due to meds cause drowsiness. Wheelchair offered for pt for discharge but pt refused and insisted on ambulating out of the dept.

## 2014-10-17 NOTE — ED Notes (Signed)
Pt fell yesterday in a hole, right knee pain, cause foot to be numb

## 2014-10-17 NOTE — Discharge Instructions (Signed)
Paresthesia Paresthesia is an abnormal burning or prickling sensation. This sensation is generally felt in the hands, arms, legs, or feet. However, it may occur in any part of the body. It is usually not painful. The feeling may be described as:  Tingling or numbness.  "Pins and needles."  Skin crawling.  Buzzing.  Limbs "falling asleep."  Itching. Most people experience temporary (transient) paresthesia at some time in their lives. CAUSES  Paresthesia may occur when you breathe too quickly (hyperventilation). It can also occur without any apparent cause. Commonly, paresthesia occurs when pressure is placed on a nerve. The feeling quickly goes away once the pressure is removed. For some people, however, paresthesia is a long-lasting (chronic) condition caused by an underlying disorder. The underlying disorder may be:  A traumatic, direct injury to nerves. Examples include a:  Broken (fractured) neck.  Fractured skull.  A disorder affecting the brain and spinal cord (central nervous system). Examples include:  Transverse myelitis.  Encephalitis.  Transient ischemic attack.  Multiple sclerosis.  Stroke.  Tumor or blood vessel problems, such as an arteriovenous malformation pressing against the brain or spinal cord.  A condition that damages the peripheral nerves (peripheral neuropathy). Peripheral nerves are not part of the brain and spinal cord. These conditions include:  Diabetes.  Peripheral vascular disease.  Nerve entrapment syndromes, such as carpal tunnel syndrome.  Shingles.  Hypothyroidism.  Vitamin B12 deficiencies.  Alcoholism.  Heavy metal poisoning (lead, arsenic).  Rheumatoid arthritis.  Systemic lupus erythematosus. DIAGNOSIS  Your caregiver will attempt to find the underlying cause of your paresthesia. Your caregiver may:  Take your medical history.  Perform a physical exam.  Order various lab tests.  Order imaging tests. TREATMENT    Treatment for paresthesia depends on the underlying cause. HOME CARE INSTRUCTIONS  Avoid drinking alcohol.  You may consider massage or acupuncture to help relieve your symptoms.  Keep all follow-up appointments as directed by your caregiver. SEEK IMMEDIATE MEDICAL CARE IF:   You feel weak.  You have trouble walking or moving.  You have problems with speech or vision.  You feel confused.  You cannot control your bladder or bowel movements.  You feel numbness after an injury.  You faint.  Your burning or prickling feeling gets worse when walking.  You have pain, cramps, or dizziness.  You develop a rash. MAKE SURE YOU:  Understand these instructions.  Will watch your condition.  Will get help right away if you are not doing well or get worse. Document Released: 02/19/2002 Document Revised: 05/24/2011 Document Reviewed: 11/20/2010 [facility] Patient Information 2015 [place], [place]. This information is not intended to replace advice given to you by your health care provider. Make sure you discuss any questions you have with your health care provider.  Knee Pain The knee is the complex joint between your thigh and your lower leg. It is made up of bones, tendons, ligaments, and cartilage. The bones that make up the knee are:  The femur in the thigh.  The tibia and fibula in the lower leg.  The patella or kneecap riding in the groove on the lower femur. CAUSES  Knee pain is a common complaint with many causes. A few of these causes are:  Injury, such as:  A ruptured ligament or tendon injury.  Torn cartilage.  Medical conditions, such as:  Gout  Arthritis  Infections  Overuse, over training, or overdoing a physical activity. Knee pain can be minor or severe. Knee pain can accompany debilitating  injury. Minor knee problems often respond well to self-care measures or get well on their own. More serious injuries may need medical intervention or even  surgery. SYMPTOMS The knee is complex. Symptoms of knee problems can vary widely. Some of the problems are:  Pain with movement and weight bearing.  Swelling and tenderness.  Buckling of the knee.  Inability to straighten or extend your knee.  Your knee locks and you cannot straighten it.  Warmth and redness with pain and fever.  Deformity or dislocation of the kneecap. DIAGNOSIS  Determining what is wrong may be very straight forward such as when there is an injury. It can also be challenging because of the complexity of the knee. Tests to make a diagnosis may include:  Your caregiver taking a history and doing a physical exam.  Routine X-rays can be used to rule out other problems. X-rays will not reveal a cartilage tear. Some injuries of the knee can be diagnosed by:  Arthroscopy a surgical technique by which a small video camera is inserted through tiny incisions on the sides of the knee. This procedure is used to examine and repair internal knee joint problems. Tiny instruments can be used during arthroscopy to repair the torn knee cartilage (meniscus).  Arthrography is a radiology technique. A contrast liquid is directly injected into the knee joint. Internal structures of the knee joint then become visible on X-ray film.  An MRI scan is a non X-ray radiology procedure in which magnetic fields and a computer produce two- or three-dimensional images of the inside of the knee. Cartilage tears are often visible using an MRI scanner. MRI scans have largely replaced arthrography in diagnosing cartilage tears of the knee.  Blood work.  Examination of the fluid that helps to lubricate the knee joint (synovial fluid). This is done by taking a sample out using a needle and a syringe. TREATMENT The treatment of knee problems depends on the cause. Some of these treatments are:  Depending on the injury, proper casting, splinting, surgery, or physical therapy care will be needed.  Give  yourself adequate recovery time. Do not overuse your joints. If you begin to get sore during workout routines, back off. Slow down or do fewer repetitions.  For repetitive activities such as cycling or running, maintain your strength and nutrition.  Alternate muscle groups. For example, if you are a weight lifter, work the upper body on one day and the lower body the next.  Either tight or weak muscles do not give the proper support for your knee. Tight or weak muscles do not absorb the stress placed on the knee joint. Keep the muscles surrounding the knee strong.  Take care of mechanical problems.  If you have flat feet, orthotics or special shoes may help. See your caregiver if you need help.  Arch supports, sometimes with wedges on the inner or outer aspect of the heel, can help. These can shift pressure away from the side of the knee most bothered by osteoarthritis.  A brace called an "unloader" brace also may be used to help ease the pressure on the most arthritic side of the knee.  If your caregiver has prescribed crutches, braces, wraps or ice, use as directed. The acronym for this is PRICE. This means protection, rest, ice, compression, and elevation.  Nonsteroidal anti-inflammatory drugs (NSAIDs), can help relieve pain. But if taken immediately after an injury, they may actually increase swelling. Take NSAIDs with food in your stomach. Stop them if you  develop stomach problems. Do not take these if you have a history of ulcers, stomach pain, or bleeding from the bowel. Do not take without your caregiver's approval if you have problems with fluid retention, heart failure, or kidney problems.  For ongoing knee problems, physical therapy may be helpful.  Glucosamine and chondroitin are over-the-counter dietary supplements. Both may help relieve the pain of osteoarthritis in the knee. These medicines are different from the usual anti-inflammatory drugs. Glucosamine may decrease the rate of  cartilage destruction.  Injections of a corticosteroid drug into your knee joint may help reduce the symptoms of an arthritis flare-up. They may provide pain relief that lasts a few months. You may have to wait a few months between injections. The injections do have a small increased risk of infection, water retention, and elevated blood sugar levels.  Hyaluronic acid injected into damaged joints may ease pain and provide lubrication. These injections may work by reducing inflammation. A series of shots may give relief for as long as 6 months.  Topical painkillers. Applying certain ointments to your skin may help relieve the pain and stiffness of osteoarthritis. Ask your pharmacist for suggestions. Many over the-counter products are approved for temporary relief of arthritis pain.  In some countries, doctors often prescribe topical NSAIDs for relief of chronic conditions such as arthritis and tendinitis. A review of treatment with NSAID creams found that they worked as well as oral medications but without the serious side effects. PREVENTION  Maintain a healthy weight. Extra pounds put more strain on your joints.  Get strong, stay limber. Weak muscles are a common cause of knee injuries. Stretching is important. Include flexibility exercises in your workouts.  Be smart about exercise. If you have osteoarthritis, chronic knee pain or recurring injuries, you may need to change the way you exercise. This does not mean you have to stop being active. If your knees ache after jogging or playing basketball, consider switching to swimming, water aerobics, or other low-impact activities, at least for a few days a week. Sometimes limiting high-impact activities will provide relief.  Make sure your shoes fit well. Choose footwear that is right for your sport.  Protect your knees. Use the proper gear for knee-sensitive activities. Use kneepads when playing volleyball or laying carpet. Buckle your seat belt  every time you drive. Most shattered kneecaps occur in car accidents.  Rest when you are tired. SEEK MEDICAL CARE IF:  You have knee pain that is continual and does not seem to be getting better.  SEEK IMMEDIATE MEDICAL CARE IF:  Your knee joint feels hot to the touch and you have a high fever. MAKE SURE YOU:   Understand these instructions.  Will watch your condition.  Will get help right away if you are not doing well or get worse. Document Released: 12/27/2006 Document Revised: 05/24/2011 Document Reviewed: 12/27/2006 [facility] Patient Information 2015 [place], [place]. This information is not intended to replace advice given to you by your health care provider. Make sure you discuss any questions you have with your health care provider.   Use medications as prescribed taking your next dose of prednisone tomorrow evening.  Use caution with the hydrocodone as this medication will make you drowsy.  Use ice and elevation as much as possible for the next several days at your knee   injury site. Use your crutches to minimize weightbearing on that side.  As discussed the numbness in your foot should get better with time, however you would benefit  from further evaluation by a neurologist and have been referred to Dr. Merlene Laughter for this reason.  Please call him for an appointment to further testing of your nerve and numbness.  Additionally, call your orthopedist for further evaluation of your knee if your pain does not improve with medication ice and rest.

## 2014-10-17 NOTE — Telephone Encounter (Signed)
Patient called to inquire about appointment for a new problem - leg going numb.  Patient states still has no insurance, and asked about self-pay amount.  I offered first available, which is in 3 weeks, due to our provider having been out of office, and provided the self-pay policy information, and also provided the contact information for [facility], as patient said she does not have a primary care physician.  She will make this call, and will contact us back if she wishes to schedule appointment.

## 2014-10-19 NOTE — ED Provider Notes (Signed)
CSN: [id number]     Arrival date & time 10/17/14  1742 History   First MD Initiated Contact with Patient 10/17/14 1753     Chief Complaint  Patient presents with  . Knee Injury     (Consider location/radiation/quality/duration/timing/severity/associated sxs/prior Treatment) The history is provided by the patient.   Patricia Rivers is a 29 y.o. female presenting with pain in her right knee and right foot numbness since stepping in a hole yesterday, causing her to fall forward landing on the right knee in a flexed position.  She has used ice, elevation and rest as much as possible without improvement in her symtpoms.  She is able to weight bear although with increased pain.     Past Medical History  Diagnosis Date  . Right ankle injury   . Colon polyp   . Rectal polyp   . Carcinoid tumor of rectum   . 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 GERD (gastroesophageal reflux disease)   . Ovarian failure    Past Surgical History  Procedure Laterality Date  . Colonoscopy with esophagogastroduodenoscopy (egd) N/A 05/16/2013    Dr. Gala Romney: rectal polyp with low grade carcinoid tumor, colon polyp with tubulovillous adenoma with high grade dysplasia. Needs repeat colonoscopy in 3 months. EGD with antral erosions, duodenal bulb erosions, superficial ulceration. path with mild chronic gastritis   . Colonoscopy with propofol N/A 09/06/2013    [id number] ileocolonoscopy. No evidence  of residual polyp anywhere. Next TCS 08/2016.  [name] Cholecystectomy     Family History  Problem Relation Age of Onset  . Colon cancer Maternal Grandmother   . Cervical cancer Mother   . Asthma    . Diabetes     History  Substance Use Topics  . Smoking status: Current Every Day Smoker -- 1.00 packs/day for 11 years    Types: Cigarettes  . Smokeless tobacco: Not on file     Comment: Smokes one pack of cigarettes daily  . Alcohol Use: Yes     Comment: occasionally   OB History    No data  available     Review of Systems  Constitutional: Negative for fever.  Musculoskeletal: Positive for arthralgias. Negative for myalgias and joint swelling.  Skin: Negative for color change, pallor and wound.  Neurological: Positive for numbness. Negative for weakness.      Allergies  Codeine; Erythromycin; Hydromorphone hcl; Nsaids; Penicillins; Bentyl; Latex; and Tape  Home Medications   Prior to Admission medications   Medication Sig Start Date End Date Taking? Authorizing Provider  PARoxetine (PAXIL) 20 MG tablet Take 20 mg by mouth daily.   Yes Historical Provider, MD  HYDROcodone-acetaminophen (NORCO/VICODIN) 5-325 MG per tablet Take 1 tablet by mouth every 4 (four) hours as needed. 10/17/14   [name], PA-C  ondansetron (ZOFRAN ODT) 4 MG disintegrating tablet Take 1 tablet (4 mg total) by mouth every 8 (eight) hours as needed for nausea. Patient not taking: Reported on 10/17/2014 09/08/14   Tanna Furry, MD  predniSONE (DELTASONE) 10 MG tablet 6, 5, 4, 3, 2 then 1 tablet by mouth daily for 6 days total. 10/17/14   [name], PA-C  traMADol (ULTRAM) 50 MG tablet Take 1 tablet (50 mg total) by mouth every 6 (six) hours as needed. Patient not taking: Reported on 10/17/2014 08/27/14   Kristen N Ward, DO   BP 125/78 mmHg  Pulse 83  Temp(Src) 98.2 F (36.8 C) (Oral)  Resp 18  Ht 5\' 7"  (1.702 m)  Wt 230 lb (104.327 kg)  BMI 36.01 kg/m2  SpO2 100% Physical Exam  Constitutional: She appears well-developed and well-nourished.  HENT:  Head: Atraumatic.  Neck: Normal range of motion.  Cardiovascular:  Pulses equal bilaterally  Musculoskeletal: She exhibits tenderness.       Right knee: She exhibits decreased range of motion. She exhibits no swelling, no effusion, no ecchymosis, no deformity, no LCL laxity, normal meniscus and no MCL laxity. Tenderness found. Medial joint line and lateral joint line tenderness noted.       Right foot: There is no tenderness, no bony tenderness, no  swelling, normal capillary refill, no crepitus and no deformity.  Neurological: She is alert. She has normal strength. She displays normal reflexes. She displays no Babinski's sign on the right side. She displays no Babinski's sign on the left side.  Reflex Scores:      Bicep reflexes are 2+ on the right side and 2+ on the left side.      Achilles reflexes are 2+ on the right side and 2+ on the left side. No clonus. Reduced response to babinski test on right, down going toes not as pronounced as on the left.  Pt denies sensation to both fine and sharp touch along L4-L5-S1 distribution from knee to distal foot upon exam.  No foot drop.  Skin: Skin is warm and dry.  Psychiatric: She has a normal mood and affect.    ED Course  Procedures (including critical care time) Labs Review Labs Reviewed - No data to display  Imaging Review Dg Knee Complete 4 Views Right  10/17/2014   CLINICAL DATA:  [place] in hole yesterday. Right knee pain, swelling, and inability to bear weight. Initial encounter.  EXAM: RIGHT KNEE - COMPLETE 4+ VIEW  COMPARISON:  None.  FINDINGS: There is no evidence of fracture, dislocation, or joint effusion. There is no evidence of arthropathy or other focal bone abnormality. Soft tissues are unremarkable.  IMPRESSION: Negative.   Electronically Signed   By: Earle Gell M.D.   On: 10/17/2014 18:30     EKG Interpretation None      MDM   Final diagnoses:  Numbness of foot  Knee pain, acute, right    Patients labs and/or radiological studies were reviewed and considered during the medical decision making and disposition process. Imaging was reviewed, interpreted and I agree with radiologists reading. Results were also discussed with patient. Pt was also examined by Dr. Vanita Panda and on his exam pt endorsed sensation in the right lower leg, but no sensation globally of foot.  She had no exam findings to suggest knee ligament tears, xrays negative for fx,  Treated for knee sprain,  applied ace wrap, pt endorses has crutches at home, advised to use to avoid weight bearing.  She was prescribed a prednisone taper, hydrocodone, continue with RICE tx.  Referrals to both ortho and neuro for f/u care given.  Sensation deficit without sig motor deficit, probable transient neuropraxia - reassurance given this will probably resolve with anti inflammatories and time, but will need neuro recheck - pt agrees with plan and will call for appt.  She refused wheelchair going out today - ambulated without foot drop.     [name], PA-C 10/19/14 Between, MD 10/19/14 [id number]

## 2014-10-23 ENCOUNTER — Encounter (HOSPITAL_COMMUNITY): Payer: Self-pay | Admitting: *Deleted

## 2014-10-23 ENCOUNTER — Emergency Department (HOSPITAL_COMMUNITY)
Admission: EM | Admit: 2014-10-23 | Discharge: 2014-10-23 | Disposition: A | Discharge status: Home / Self Care | Payer: Self-pay | Attending: Emergency Medicine | Admitting: Emergency Medicine

## 2014-10-23 DIAGNOSIS — R52 Pain, unspecified: Secondary | ICD-10-CM

## 2014-10-23 DIAGNOSIS — Z87828 Personal history of other (healed) physical injury and trauma: Secondary | ICD-10-CM | POA: Insufficient documentation

## 2014-10-23 DIAGNOSIS — M7989 Other specified soft tissue disorders: Secondary | ICD-10-CM

## 2014-10-23 DIAGNOSIS — Y9389 Activity, other specified: Secondary | ICD-10-CM | POA: Insufficient documentation

## 2014-10-23 DIAGNOSIS — M79661 Pain in right lower leg: Secondary | ICD-10-CM

## 2014-10-23 DIAGNOSIS — W1842XA Slipping, tripping and stumbling without falling due to stepping into hole or opening, initial encounter: Secondary | ICD-10-CM | POA: Insufficient documentation

## 2014-10-23 DIAGNOSIS — R609 Edema, unspecified: Secondary | ICD-10-CM

## 2014-10-23 DIAGNOSIS — Y998 Other external cause status: Secondary | ICD-10-CM | POA: Insufficient documentation

## 2014-10-23 DIAGNOSIS — Z85040 Personal history of malignant carcinoid tumor of rectum: Secondary | ICD-10-CM | POA: Insufficient documentation

## 2014-10-23 DIAGNOSIS — Z880 Allergy status to penicillin: Secondary | ICD-10-CM | POA: Insufficient documentation

## 2014-10-23 DIAGNOSIS — Z91040 Latex allergy status: Secondary | ICD-10-CM | POA: Insufficient documentation

## 2014-10-23 DIAGNOSIS — S8991XA Unspecified injury of right lower leg, initial encounter: Secondary | ICD-10-CM | POA: Insufficient documentation

## 2014-10-23 DIAGNOSIS — R600 Localized edema: Secondary | ICD-10-CM | POA: Insufficient documentation

## 2014-10-23 DIAGNOSIS — Z86010 Personal history of colonic polyps: Secondary | ICD-10-CM | POA: Insufficient documentation

## 2014-10-23 DIAGNOSIS — Z79899 Other long term (current) drug therapy: Secondary | ICD-10-CM | POA: Insufficient documentation

## 2014-10-23 DIAGNOSIS — Z8639 Personal history of other endocrine, nutritional and metabolic disease: Secondary | ICD-10-CM | POA: Insufficient documentation

## 2014-10-23 DIAGNOSIS — Z8719 Personal history of other diseases of the digestive system: Secondary | ICD-10-CM | POA: Insufficient documentation

## 2014-10-23 DIAGNOSIS — Y9289 Other specified places as the place of occurrence of the external cause: Secondary | ICD-10-CM | POA: Insufficient documentation

## 2014-10-23 DIAGNOSIS — Z720 Tobacco use: Secondary | ICD-10-CM | POA: Insufficient documentation

## 2014-10-23 MED ORDER — RIVAROXABAN 15 MG PO TABS
ORAL_TABLET | ORAL | Status: AC
  Filled 2014-10-23: qty 1

## 2014-10-23 MED ORDER — OXYCODONE-ACETAMINOPHEN 7.5-325 MG PO TABS: 1.0000 | ORAL_TABLET | ORAL | Status: DC | PRN

## 2014-10-23 MED ORDER — RIVAROXABAN 15 MG PO TABS
15.0000 mg | ORAL_TABLET | Freq: Once | ORAL | Status: AC
Start: 2014-10-23 — End: 2014-10-23
  Administered 2014-10-23: 15 mg via ORAL
  Filled 2014-10-23: qty 1

## 2014-10-23 MED ORDER — OXYCODONE-ACETAMINOPHEN 5-325 MG PO TABS
2.0000 | ORAL_TABLET | Freq: Once | ORAL | Status: AC
  Administered 2014-10-23: 2 via ORAL
  Filled 2014-10-23: qty 2

## 2014-10-23 NOTE — Discharge Instructions (Signed)
Return tomorrow for ultrasound to make sure there is not a blood clot in one of your veins. Wear the immobilizer as needed. Continue to use the crutches as needed.  Muscle Strain A muscle strain is an injury that occurs when a muscle is stretched beyond its normal length. Usually a small number of muscle fibers are torn when this happens. Muscle strain is rated in degrees. First-degree strains have the least amount of muscle fiber tearing and pain. Second-degree and third-degree strains have increasingly more tearing and pain.  Usually, recovery from muscle strain takes 1-2 weeks. Complete healing takes 5-6 weeks.  CAUSES  Muscle strain happens when a sudden, violent force placed on a muscle stretches it too far. This may occur with lifting, sports, or a fall.  RISK FACTORS Muscle strain is especially common in athletes.  SIGNS AND SYMPTOMS At the site of the muscle strain, there may be:  Pain.  Bruising.  Swelling.  Difficulty using the muscle due to pain or lack of normal function. DIAGNOSIS  Your health care provider will perform a physical exam and ask about your medical history. TREATMENT  Often, the best treatment for a muscle strain is resting, icing, and applying cold compresses to the injured area.  HOME CARE INSTRUCTIONS   Use the PRICE method of treatment to promote muscle healing during the first 2-3 days after your injury. The PRICE method involves:  Protecting the muscle from being injured again.  Restricting your activity and resting the injured body part.  Icing your injury. To do this, put ice in a plastic bag. Place a towel between your skin and the bag. Then, apply the ice and leave it on from 15-20 minutes each hour. After the third day, switch to moist heat packs.  Apply compression to the injured area with a splint or elastic bandage. Be careful not to wrap it too tightly. This may interfere with blood circulation or increase swelling.  Elevate the injured  body part above the level of your heart as often as you can.  Only take over-the-counter or prescription medicines for pain, discomfort, or fever as directed by your health care provider.  Warming up prior to exercise helps to prevent future muscle strains. SEEK MEDICAL CARE IF:   You have increasing pain or swelling in the injured area.  You have numbness, tingling, or a significant loss of strength in the injured area. MAKE SURE YOU:   Understand these instructions.  Will watch your condition.  Will get help right away if you are not doing well or get worse. Document Released: 03/01/2005 Document Revised: 12/20/2012 Document Reviewed: 09/28/2012 [facility] Patient Information 2015 [place], [place]. This information is not intended to replace advice given to you by your health care provider. Make sure you discuss any questions you have with your health care provider.  Acetaminophen; Oxycodone tablets What is this medicine? ACETAMINOPHEN; OXYCODONE (a set a MEE noe fen; ox i KOE done) is a pain reliever. It is used to treat mild to moderate pain. This medicine may be used for other purposes; ask your health care provider or pharmacist if you have questions. COMMON BRAND NAME(S): Endocet, Magnacet, Narvox, Percocet, Perloxx, Primalev, Primlev, Roxicet, Xolox What should I tell my health care provider before I take this medicine? They need to know if you have any of these conditions: -brain tumor -Crohn's disease, inflammatory bowel disease, or ulcerative colitis -drug abuse or addiction -head injury -heart or circulation problems -if you often drink alcohol -kidney disease  or problems going to the bathroom -liver disease -lung disease, asthma, or breathing problems -an unusual or allergic reaction to acetaminophen, oxycodone, other opioid analgesics, other medicines, foods, dyes, or preservatives -pregnant or trying to get pregnant -breast-feeding How should I use this  medicine? Take this medicine by mouth with a full glass of water. Follow the directions on the prescription label. Take your medicine at regular intervals. Do not take your medicine more often than directed. Talk to your pediatrician regarding the use of this medicine in children. Special care may be needed. Patients over 62 years old may have a stronger reaction and need a smaller dose. Overdosage: If you think you have taken too much of this medicine contact a poison control center or emergency room at once. NOTE: This medicine is only for you. Do not share this medicine with others. What if I miss a dose? If you miss a dose, take it as soon as you can. If it is almost time for your next dose, take only that dose. Do not take double or extra doses. What may interact with this medicine? -alcohol -antihistamines -barbiturates like amobarbital, butalbital, butabarbital, methohexital, pentobarbital, phenobarbital, thiopental, and secobarbital -benztropine -drugs for bladder problems like solifenacin, trospium, oxybutynin, tolterodine, hyoscyamine, and methscopolamine -drugs for breathing problems like ipratropium and tiotropium -drugs for certain stomach or intestine problems like propantheline, homatropine methylbromide, glycopyrrolate, atropine, belladonna, and dicyclomine -general anesthetics like etomidate, ketamine, nitrous oxide, propofol, desflurane, enflurane, halothane, isoflurane, and sevoflurane -medicines for depression, anxiety, or psychotic disturbances -medicines for sleep -muscle relaxants -naltrexone -narcotic medicines (opiates) for pain -phenothiazines like perphenazine, thioridazine, chlorpromazine, mesoridazine, fluphenazine, prochlorperazine, promazine, and trifluoperazine -scopolamine -tramadol -trihexyphenidyl This list may not describe all possible interactions. Give your health care provider a list of all the medicines, herbs, non-prescription drugs, or dietary  supplements you use. Also tell them if you smoke, drink alcohol, or use illegal drugs. Some items may interact with your medicine. What should I watch for while using this medicine? Tell your doctor or health care professional if your pain does not go away, if it gets worse, or if you have new or a different type of pain. You may develop tolerance to the medicine. Tolerance means that you will need a higher dose of the medication for pain relief. Tolerance is normal and is expected if you take this medicine for a long time. Do not suddenly stop taking your medicine because you may develop a severe reaction. Your body becomes used to the medicine. This does NOT mean you are addicted. Addiction is a behavior related to getting and using a drug for a non-medical reason. If you have pain, you have a medical reason to take pain medicine. Your doctor will tell you how much medicine to take. If your doctor wants you to stop the medicine, the dose will be slowly lowered over time to avoid any side effects. You may get drowsy or dizzy. Do not drive, use machinery, or do anything that needs mental alertness until you know how this medicine affects you. Do not stand or sit up quickly, especially if you are an older patient. This reduces the risk of dizzy or fainting spells. Alcohol may interfere with the effect of this medicine. Avoid alcoholic drinks. There are different types of narcotic medicines (opiates) for pain. If you take more than one type at the same time, you may have more side effects. Give your health care provider a list of all medicines you use. Your doctor will tell  you how much medicine to take. Do not take more medicine than directed. Call emergency for help if you have problems breathing. The medicine will cause constipation. Try to have a bowel movement at least every 2 to 3 days. If you do not have a bowel movement for 3 days, call your doctor or health care professional. Do not take Tylenol  (acetaminophen) or medicines that have acetaminophen with this medicine. Too much acetaminophen can be very dangerous. Many nonprescription medicines contain acetaminophen. Always read the labels carefully to avoid taking more acetaminophen. What side effects may I notice from receiving this medicine? Side effects that you should report to your doctor or health care professional as soon as possible: -allergic reactions like skin rash, itching or hives, swelling of the face, lips, or tongue -breathing difficulties, wheezing -confusion -light headedness or fainting spells -severe stomach pain -unusually weak or tired -yellowing of the skin or the whites of the eyes Side effects that usually do not require medical attention (report to your doctor or health care professional if they continue or are bothersome): -dizziness -drowsiness -nausea -vomiting This list may not describe all possible side effects. Call your doctor for medical advice about side effects. You may report side effects to FDA at 1-800-FDA-1088. Where should I keep my medicine? Keep out of the reach of children. This medicine can be abused. Keep your medicine in a safe place to protect it from theft. Do not share this medicine with anyone. Selling or giving away this medicine is dangerous and against the law. Store at room temperature between 20 and 25 degrees C (68 and 77 degrees F). Keep container tightly closed. Protect from light. This medicine may cause accidental overdose and death if it is taken by other adults, children, or pets. Flush any unused medicine down the toilet to reduce the chance of harm. Do not use the medicine after the expiration date. NOTE: This sheet is a summary. It may not cover all possible information. If you have questions about this medicine, talk to your doctor, pharmacist, or health care provider.  2015, [employer]. (2012-10-23 13:17:35)

## 2014-10-23 NOTE — ED Provider Notes (Signed)
CSN: [id number]     Arrival date & time 10/23/14  2122 History   This chart was scribed for Delora Fuel, MD by Randa Evens, ED Scribe. This patient was seen in room APA02/APA02 and the patient's care was started at 11:00 PM.    Chief Complaint  Patient presents with  . Leg Swelling   The history is provided by the patient. No language interpreter was used.   HPI Comments: JENICE LEINER is a 29 y.o. female with PMHx listed below who presents to the Emergency Department complaining of worsening right lower leg swelling onset 1 week. Pt states she is still having pain in the posterior leg. Pt rates the severity of pain 8-9/10. Pt states that the pain is worse with movement. Pt states she has been taking Vicodin that provides slight temporary relief. Pt states she has been ambulating with crutches. Pt states she has been keeping the leg elevated with no relief. Pt reports initial injury was from twisting her knee when she stepped into a whole.   Past Medical History  Diagnosis Date  . Right ankle injury   . Colon polyp   . Rectal polyp   . Carcinoid tumor of rectum   . 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 GERD (gastroesophageal reflux disease)   . Ovarian failure    Past Surgical History  Procedure Laterality Date  . Colonoscopy with esophagogastroduodenoscopy (egd) N/A 05/16/2013    Dr. Gala Romney: rectal polyp with low grade carcinoid tumor, colon polyp with tubulovillous adenoma with high grade dysplasia. Needs repeat colonoscopy in 3 months. EGD with antral erosions, duodenal bulb erosions, superficial ulceration. path with mild chronic gastritis   . Colonoscopy with propofol N/A 09/06/2013    WUJ:[id number] ileocolonoscopy. No evidence  of residual polyp anywhere. Next TCS 08/2016.  [name] Cholecystectomy     Family History  Problem Relation Age of Onset  . Colon cancer Maternal Grandmother   . Cervical cancer Mother   . Asthma    . Diabetes     Social History   Substance Use Topics  . Smoking status: Current Every Day Smoker -- 1.00 packs/day for 11 years    Types: Cigarettes  . Smokeless tobacco: None     Comment: Smokes one pack of cigarettes daily  . Alcohol Use: Yes     Comment: occasionally   OB History    No data available      Review of Systems  Musculoskeletal: Positive for myalgias.  All other systems reviewed and are negative.    Allergies  Codeine; Erythromycin; Hydromorphone hcl; Nsaids; Penicillins; Bentyl; Latex; and Tape  Home Medications   Prior to Admission medications   Medication Sig Start Date End Date Taking? Authorizing Provider  HYDROcodone-acetaminophen (NORCO/VICODIN) 5-325 MG per tablet Take 1 tablet by mouth every 4 (four) hours as needed. 10/17/14  Yes [name], PA-C  PARoxetine (PAXIL) 20 MG tablet Take 20 mg by mouth daily.   Yes Historical Provider, MD  ondansetron (ZOFRAN ODT) 4 MG disintegrating tablet Take 1 tablet (4 mg total) by mouth every 8 (eight) hours as needed for nausea. Patient not taking: Reported on 10/17/2014 09/08/14   Tanna Furry, MD  predniSONE (DELTASONE) 10 MG tablet 6, 5, 4, 3, 2 then 1 tablet by mouth daily for 6 days total. Patient not taking: Reported on 10/23/2014 10/17/14   [name], PA-C  traMADol (ULTRAM) 50 MG tablet Take 1 tablet (50 mg total) by mouth  every 6 (six) hours as needed. Patient not taking: Reported on 10/17/2014 08/27/14   Kristen N Ward, DO   BP 134/81 mmHg  Pulse 85  Temp(Src) 98.2 F (36.8 C) (Oral)  Resp 16  Ht 5\' 7"  (1.702 m)  Wt 230 lb (104.327 kg)  BMI 36.01 kg/m2  SpO2 99%   Physical Exam  Constitutional: She is oriented to person, place, and time. She appears well-developed and well-nourished. No distress.  HENT:  Head: Normocephalic and atraumatic.  Eyes: Conjunctivae and EOM are normal. Pupils are equal, round, and reactive to light.  Neck: Normal range of motion. Neck supple. No JVD present.  Cardiovascular: Normal rate, regular rhythm and  normal heart sounds.   No murmur heard. Pulmonary/Chest: Effort normal and breath sounds normal. She has no wheezes. She has no rales. She exhibits no tenderness.  Abdominal: Soft. Bowel sounds are normal. She exhibits no distension and no mass. There is no tenderness.  Musculoskeletal: Normal range of motion. She exhibits tenderness.  1+ pitting edema of the left leg  2+ pitting edema of right lower leg. No tenderness along the lateral collateral ligament, mild tenderness along patellar ligament, positive homans' sign, distal NVI  Lymphadenopathy:    She has no cervical adenopathy.  Neurological: She is alert and oriented to person, place, and time. No cranial nerve deficit. She exhibits normal muscle tone. Coordination normal.  Skin: Skin is warm and dry. No rash noted.  Psychiatric: She has a normal mood and affect. Her behavior is normal. Judgment and thought content normal.  Nursing note and vitals reviewed.   ED Course  Procedures (including critical care time) DIAGNOSTIC STUDIES: Oxygen Saturation is 99% on RA, normal by my interpretation.    COORDINATION OF CARE: 11:06 PM-Discussed treatment plan with pt at bedside and pt agreed to plan.    MDM   Final diagnoses:  Pain and swelling of lower leg, right      Right leg pain and swelling consistent with tear of gastrocnemius/soleus muscles. However, do need to rule out DVT. She is given a dose of rivaroxaban and placed in a knee immobilizer for comfort. She has crutches which she is to continue using. Advised of the fact that muscle tears frequently take a fairly long time to heal and that the degree of swelling she has now is not necessarily unexpected.   I personally performed the services described in this documentation, which was scribed in my presence. The recorded information has been reviewed and is accurate.       Delora Fuel, MD 38/17/71 [id number]

## 2014-10-23 NOTE — ED Notes (Signed)
Pt reporting swelling in lower right leg.  States that she was seen a few days ago and diagnosed with torn ligaments in that knee.  Pt currently has knee in ace wrap, but reports wrap is removed frequently.

## 2014-10-24 ENCOUNTER — Ambulatory Visit (HOSPITAL_COMMUNITY)
Admission: RE | Admit: 2014-10-24 | Discharge: 2014-10-24 | Disposition: A | Discharge status: Home / Self Care | Payer: Self-pay | Source: Ambulatory Visit | Attending: Emergency Medicine | Admitting: Emergency Medicine

## 2014-10-24 DIAGNOSIS — M7989 Other specified soft tissue disorders: Secondary | ICD-10-CM | POA: Insufficient documentation

## 2014-10-24 DIAGNOSIS — R52 Pain, unspecified: Secondary | ICD-10-CM

## 2014-10-24 DIAGNOSIS — M79604 Pain in right leg: Secondary | ICD-10-CM | POA: Insufficient documentation

## 2014-10-24 DIAGNOSIS — R609 Edema, unspecified: Secondary | ICD-10-CM

## 2014-10-31 ENCOUNTER — Encounter: Payer: Self-pay | Admitting: Obstetrics & Gynecology

## 2014-10-31 ENCOUNTER — Ambulatory Visit (INDEPENDENT_AMBULATORY_CARE_PROVIDER_SITE_OTHER): Payer: Self-pay | Admitting: Obstetrics & Gynecology

## 2014-10-31 VITALS — BP 107/78 | HR 76 | Temp 98.0°F | Ht 68.0 in | Wt 218.2 lb

## 2014-10-31 DIAGNOSIS — N951 Menopausal and female climacteric states: Secondary | ICD-10-CM

## 2014-10-31 MED ORDER — ESTRADIOL 1 MG PO TABS: 1.0000 mg | ORAL_TABLET | Freq: Every day | ORAL | Status: DC

## 2014-10-31 NOTE — Patient Instructions (Signed)

## 2014-10-31 NOTE — Progress Notes (Signed)
Patient ID: Patricia Rivers, female   DOB: 1986-01-21, 29 y.o.   MRN: [id number] History:  29 y.o. No obstetric history on file. here today for f/u of menopausal state.  Pt reports that she is smoking less than prev.    The following portions of the patient's history were reviewed and updated as appropriate: allergies, current medications, past family history, past medical history, past social history, past surgical history and problem list.  Review of Systems:  Pertinent items are noted in HPI.  Objective:  Physical Exam Blood pressure 107/78, pulse 76, temperature 98 F (36.7 C), temperature source Oral, height 5\' 8"  (1.727 m), weight 218 lb 3.2 oz (98.975 kg). Gen: NAD Exam deferred  Labs and Imaging US Venous Img Lower Unilateral Right  10/24/2014   CLINICAL DATA:  Right leg pain and swelling for 7 day  EXAM: RIGHT LOWER EXTREMITY VENOUS DUPLEX ULTRASOUND  TECHNIQUE: Doppler venous assessment of the right lower extremity deep venous system was performed, including characterization of spectral flow, compressibility, and phasicity.  COMPARISON:  None.  FINDINGS: There is complete compressibility of the right common femoral, femoral, and popliteal veins. Doppler analysis demonstrates phasicity and augmentation.  IMPRESSION: No evidence of right lower extremity DVT.   Electronically Signed   By: Marybelle Killings M.D.   On: 10/24/2014 12:20   Dg Knee Complete 4 Views Right  10/17/2014   CLINICAL DATA:  [place] in hole yesterday. Right knee pain, swelling, and inability to bear weight. Initial encounter.  EXAM: RIGHT KNEE - COMPLETE 4+ VIEW  COMPARISON:  None.  FINDINGS: There is no evidence of fracture, dislocation, or joint effusion. There is no evidence of arthropathy or other focal bone abnormality. Soft tissues are unremarkable.  IMPRESSION: Negative.   Electronically Signed   By: Earle Gell M.D.   On: 10/17/2014 18:30    Assessment & Plan:  Menopausal state  Discussed with pt ERT and the risks.   Will begin Estrace 1mg  daily.  Pt to f/u in 3 months or sooner.  Will use the lowest dose possible for the shortest about of time.  Discussed using hormone therapy and concerns about increased risk of heart disease, cerebrovascular disease, thromboembolic disease,  and breast cancer.  Also discussed other medical options such as Paxil, Effexor or Neurontin.   Also discussed alternative therapies such as herbal remedies but cautioned that most of the products contained phytoestrogens (plant estrogens) in unregulated amounts which can have the same effects on the body as the pharmaceutical estrogen preparations.  Patient opted for Estrace estrogen therapy for now, wants to try the oral formulation.  Estradiol 1mg  mg prescribed, told to increase to 1.5 mg if no alleviation of symptoms. She will return in 3 months for reevaluation.    Nikole Swartzentruber L. Harraway-Smith, M.D., Cherlynn June

## 2014-11-12 ENCOUNTER — Emergency Department (HOSPITAL_COMMUNITY): Payer: Self-pay

## 2014-11-12 ENCOUNTER — Encounter (HOSPITAL_COMMUNITY): Payer: Self-pay

## 2014-11-12 ENCOUNTER — Emergency Department (HOSPITAL_COMMUNITY)
Admission: EM | Admit: 2014-11-12 | Discharge: 2014-11-12 | Disposition: A | Discharge status: Home / Self Care | Payer: Self-pay | Attending: Emergency Medicine | Admitting: Emergency Medicine

## 2014-11-12 DIAGNOSIS — G8929 Other chronic pain: Secondary | ICD-10-CM | POA: Insufficient documentation

## 2014-11-12 DIAGNOSIS — Z87828 Personal history of other (healed) physical injury and trauma: Secondary | ICD-10-CM | POA: Insufficient documentation

## 2014-11-12 DIAGNOSIS — M549 Dorsalgia, unspecified: Secondary | ICD-10-CM

## 2014-11-12 DIAGNOSIS — R109 Unspecified abdominal pain: Secondary | ICD-10-CM | POA: Insufficient documentation

## 2014-11-12 DIAGNOSIS — Z9049 Acquired absence of other specified parts of digestive tract: Secondary | ICD-10-CM | POA: Insufficient documentation

## 2014-11-12 DIAGNOSIS — R102 Pelvic and perineal pain: Secondary | ICD-10-CM | POA: Insufficient documentation

## 2014-11-12 DIAGNOSIS — Z87442 Personal history of urinary calculi: Secondary | ICD-10-CM | POA: Insufficient documentation

## 2014-11-12 DIAGNOSIS — Z91040 Latex allergy status: Secondary | ICD-10-CM | POA: Insufficient documentation

## 2014-11-12 DIAGNOSIS — Z85040 Personal history of malignant carcinoid tumor of rectum: Secondary | ICD-10-CM | POA: Insufficient documentation

## 2014-11-12 DIAGNOSIS — M545 Low back pain: Secondary | ICD-10-CM | POA: Insufficient documentation

## 2014-11-12 DIAGNOSIS — Z8719 Personal history of other diseases of the digestive system: Secondary | ICD-10-CM | POA: Insufficient documentation

## 2014-11-12 DIAGNOSIS — Z880 Allergy status to penicillin: Secondary | ICD-10-CM | POA: Insufficient documentation

## 2014-11-12 DIAGNOSIS — Z86018 Personal history of other benign neoplasm: Secondary | ICD-10-CM | POA: Insufficient documentation

## 2014-11-12 DIAGNOSIS — Z8639 Personal history of other endocrine, nutritional and metabolic disease: Secondary | ICD-10-CM | POA: Insufficient documentation

## 2014-11-12 DIAGNOSIS — Z86010 Personal history of colonic polyps: Secondary | ICD-10-CM | POA: Insufficient documentation

## 2014-11-12 DIAGNOSIS — Z79899 Other long term (current) drug therapy: Secondary | ICD-10-CM | POA: Insufficient documentation

## 2014-11-12 DIAGNOSIS — R112 Nausea with vomiting, unspecified: Secondary | ICD-10-CM | POA: Insufficient documentation

## 2014-11-12 DIAGNOSIS — Z720 Tobacco use: Secondary | ICD-10-CM | POA: Insufficient documentation

## 2014-11-12 DIAGNOSIS — Z3202 Encounter for pregnancy test, result negative: Secondary | ICD-10-CM | POA: Insufficient documentation

## 2014-11-12 HISTORY — DX: Other chronic pain: G89.29

## 2014-11-12 HISTORY — DX: Pelvic and perineal pain: R10.2

## 2014-11-12 HISTORY — DX: Noninfective gastroenteritis and colitis, unspecified: K52.9

## 2014-11-12 HISTORY — DX: Calculus of kidney: N20.0

## 2014-11-12 HISTORY — DX: Leiomyoma of uterus, unspecified: D25.9

## 2014-11-12 HISTORY — DX: Headache, unspecified: R51.9

## 2014-11-12 HISTORY — DX: Headache: R51

## 2014-11-12 LAB — URINALYSIS, ROUTINE W REFLEX MICROSCOPIC
BILIRUBIN URINE: NEGATIVE
Glucose, UA: NEGATIVE mg/dL
HGB URINE DIPSTICK: NEGATIVE
Ketones, ur: NEGATIVE mg/dL
Leukocytes, UA: NEGATIVE
Nitrite: NEGATIVE
Protein, ur: NEGATIVE mg/dL
Urobilinogen, UA: 1 mg/dL (ref 0.0–1.0)
pH: 5.5 (ref 5.0–8.0)

## 2014-11-12 LAB — COMPREHENSIVE METABOLIC PANEL
ALK PHOS: 79 U/L (ref 38–126)
ALT: 15 U/L (ref 14–54)
ANION GAP: 8 (ref 5–15)
AST: 14 U/L — ABNORMAL LOW (ref 15–41)
Albumin: 4.1 g/dL (ref 3.5–5.0)
BILIRUBIN TOTAL: 0.6 mg/dL (ref 0.3–1.2)
BUN: 14 mg/dL (ref 6–20)
CALCIUM: 9.3 mg/dL (ref 8.9–10.3)
CO2: 27 mmol/L (ref 22–32)
Chloride: 106 mmol/L (ref 101–111)
Creatinine, Ser: 0.8 mg/dL (ref 0.44–1.00)
GFR calc non Af Amer: >60 mL/min (ref >60)
Glucose, Bld: 96 mg/dL (ref 65–99)
Potassium: 4.2 mmol/L (ref 3.5–5.1)
Sodium: 141 mmol/L (ref 135–145)
Total Protein: 7.1 g/dL (ref 6.5–8.1)

## 2014-11-12 LAB — CBC WITH DIFFERENTIAL/PLATELET
Basophils Absolute: 0 10*3/uL (ref 0.0–0.1)
Basophils Relative: 0 % (ref 0–1)
EOS ABS: 0.4 10*3/uL (ref 0.0–0.7)
Eosinophils Relative: 5 % (ref 0–5)
HCT: 43.4 % (ref 36.0–46.0)
HEMOGLOBIN: 14.9 g/dL (ref 12.0–15.0)
Lymphocytes Relative: 37 % (ref 12–46)
Lymphs Abs: 3.5 10*3/uL (ref 0.7–4.0)
MCH: 31.4 pg (ref 26.0–34.0)
MCHC: 34.3 g/dL (ref 30.0–36.0)
MCV: 91.6 fL (ref 78.0–100.0)
MONOS PCT: 5 % (ref 3–12)
Monocytes Absolute: 0.5 10*3/uL (ref 0.1–1.0)
NEUTROS PCT: 53 % (ref 43–77)
Neutro Abs: 5.1 10*3/uL (ref 1.7–7.7)
Platelets: 209 10*3/uL (ref 150–400)
RBC: 4.74 MIL/uL (ref 3.87–5.11)
RDW: 12.8 % (ref 11.5–15.5)
WBC: 9.5 10*3/uL (ref 4.0–10.5)

## 2014-11-12 LAB — WET PREP, GENITAL
Clue Cells Wet Prep HPF POC: NONE SEEN
TRICH WET PREP: NONE SEEN
YEAST WET PREP: NONE SEEN

## 2014-11-12 LAB — LIPASE, BLOOD: Lipase: 30 U/L (ref 22–51)

## 2014-11-12 LAB — PREGNANCY, URINE: Preg Test, Ur: NEGATIVE

## 2014-11-12 MED ORDER — OXYCODONE-ACETAMINOPHEN 5-325 MG PO TABS
2.0000 | ORAL_TABLET | Freq: Once | ORAL | Status: AC
  Administered 2014-11-12: 2 via ORAL
  Filled 2014-11-12: qty 2

## 2014-11-12 MED ORDER — ONDANSETRON HCL 4 MG PO TABS: 4.0000 mg | ORAL_TABLET | Freq: Three times a day (TID) | ORAL | Status: DC | PRN

## 2014-11-12 MED ORDER — NAPROXEN 250 MG PO TABS: 250.0000 mg | ORAL_TABLET | Freq: Two times a day (BID) | ORAL | Status: DC | PRN

## 2014-11-12 MED ORDER — KETOROLAC TROMETHAMINE 60 MG/2ML IM SOLN
60.0000 mg | Freq: Once | INTRAMUSCULAR | Status: AC
  Administered 2014-11-12: 60 mg via INTRAMUSCULAR
  Filled 2014-11-12: qty 2

## 2014-11-12 NOTE — Discharge Instructions (Signed)
°Emergency Department Resource Guide °1) Find a Doctor and Pay Out of Pocket °Although you won't have to find out who is covered by your insurance plan, it is a good idea to ask around and get recommendations. You will then need to call the office and see if the doctor you have chosen will accept you as a new patient and what types of options they offer for patients who are self-pay. Some doctors offer discounts or will set up payment plans for their patients who do not have insurance, but you will need to ask so you aren't surprised when you get to your appointment. ° °2) Contact Your Local Health Department °Not all health departments have doctors that can see patients for sick visits, but many do, so it is worth a call to see if yours does. If you don't know where your local health department is, you can check in your phone book. The CDC also has a tool to help you locate your state's health department, and many state websites also have listings of all of their local health departments. ° °3) Find a Walk-in Clinic °If your illness is not likely to be very severe or complicated, you may want to try a walk in clinic. These are popping up all over the country in pharmacies, drugstores, and shopping centers. They're usually staffed by nurse practitioners or physician assistants that have been trained to treat common illnesses and complaints. They're usually fairly quick and inexpensive. However, if you have serious medical issues or chronic medical problems, these are probably not your best option. ° °No Primary Care Doctor: °- Call [facility] at  832-8000 - they can help you locate a primary care doctor that  accepts your insurance, provides certain services, etc. °- Physician Referral Service- 1-800-533-3463 ° °Chronic Pain Problems: °Organization         Address  Phone   Notes  °[facility]  (336) [phone] Patients need to be referred by their primary care doctor.  ° °Medication  Assistance: °Organization         Address  Phone   Notes  °[place] [facility] [address] °[place], [place] [address] (336) 641-8030 --Must be a resident of [place] °-- Must have NO insurance coverage whatsoever (no Medicaid/ Medicare, etc.) °-- The pt. MUST have a primary care doctor that directs their care regularly and follows them in the community °  °[facility]  (866) 331-1348   °[employer]  (888) 892-1162   ° °Agencies that provide inexpensive medical care: °Organization         Address  Phone   Notes  °[facility]  (336) 832-8035   °[facility]    (336) 832-7272   °[facility] [address] °[place], [place] [address] (336) 832-4777   °[facility] [address], °[place] (336) 271-4999   °[facility]    (336) 373-0678   °[facility]    (336) 272-1050   °[facility] ° [address], [place] Phone:  (336) 832-4444, Fax:  (336) 832-4440 Hours of Operation:  9 am - 6 pm, M-F.  Also accepts Medicaid/Medicare and self-pay.  °[facility] ° [address], [place] Phone: (336) 832-3150, Fax: (336) [phone]. Hours of Operation:  8:30 am - 5:30 pm, M-F.  Also accepts Medicaid and self-pay.  °[facility] [place] [address], [place] Phone: (336) 878-6027   °[facility] [address], [place], [place] (336)723-1848, Ext. 123 Mondays & Thursdays: 7-9 AM.  First 15 patients are seen on a first come, first serve basis. °  ° °Medicaid-accepting [place] Providers: ° °Organization         Address  Phone   Notes  °[facility] [address], [place] (336) 641-2100 Also accepts self-pay patients.  °[facility] [address], [place] ° (336) 856-9996   °[facility] [address], [place]  (336) 288-8857   °[facility] [address] [place] [address], [place] (336) 299-7000   °Veita Bland [address], [place]  ° (336) 373-1557 Only accepts [place] Access Medicaid patients after they have their name applied to their card.  ° °Self-Pay (no insurance) in [place]: ° °Organization         Address  Phone   Notes  °Sickle Cell Patients, [facility] [address], [place] (336) 832-1970   °[facility] [address], [place] (336) 832-4400   °[facility] ° [address], [place] (336) 992-4800   °[facility]/Dr. Osei-Bonsu ° [address] [place] [address], [place] or [address], [place] (336) [phone] Phone number for both [place] and [place] locations is the same.  °[facility] [address], [place] (336) 299-0000   °[facility] [address] [place] [address], [place] or [address] (336) 852-7530 °(336) 878-2260   °[facility] [address], [place] (336) 350-1642, phone; (336) 294-5005, fax Sees patients 1st and 3rd Saturday of every month.  Must not qualify for public or private insurance (i.e. Medicaid, Medicare, [place] Health Choice, Veterans' Benefits) • Household income should be no more than 200% of the poverty level •The clinic cannot treat you if you are pregnant or think you are pregnant • Sexually transmitted diseases are not treated at the clinic.  ° ° °Dental Care: °Organization         Address  Phone  Notes  °[place] [facility] [address], [place] (336) 641-6152 Accepts children up to age 21 who are enrolled in Medicaid or [place] Health Choice; pregnant women with a Medicaid card; and children who have applied for Medicaid or [place] Health Choice, but were declined, whose parents can pay a reduced fee at time of service.  °[place]  [facility] [place]  [address], [place] (336) 641-7733 Accepts children up to age 21 who are enrolled in Medicaid or [place] Health Choice; pregnant women with a Medicaid card; and children who have applied for Medicaid or [place] Health Choice, but were declined, whose parents can pay a reduced fee at time of service.  °[facility] ° [address], [place] (336) 641-4533 Patients are seen by appointment only. Walk-ins are not accepted. [facility] will see patients 18 years of age and older. °Monday - Tuesday (8am-5pm) °Most Wednesdays (8:30-5pm) °$30 per visit, cash only  °[facility] ° [address], [place] (336) 641-4533 Patients are seen by appointment only. Walk-ins are not accepted. [facility] will see patients 18 years of age and older. °One   Wednesday Evening (Monthly: Volunteer Based).  $30 per visit, cash only  °[facility]  (919) 537-3737 for adults; Children under age 4, call Graduate Pediatric Dentistry at ([phone]) [phone]. Children aged 4-14, please call ([phone]) [phone] to request a pediatric application. ° Dental services are provided in all areas of dental care including fillings, crowns and bridges, complete and partial dentures, implants, gum treatment, root canals, and extractions. Preventive care is also provided. Treatment is provided to both adults and children. °Patients are selected via a lottery and there is often a waiting list. °  °[facility] [address], °[place] ° (336) 763-8833 [email or web address] °  °[facility] [address], [place], [place] (336)723-1848, Ext. 123 Second and Fourth Thursday of each month, opens at 6:30 AM; Clinic ends at 9 AM.  Patients are seen on a first-come first-served basis, and a limited number are seen during each clinic.  ° °[facility] ° [address], [place], [place] (336) [phone]    Eligibility Requirements °You must have lived in [place], [place], or [place] counties for at least the last three months. °  You cannot be eligible for state or federal sponsored healthcare insurance, including Veterans Administration, Medicaid, or Medicare. °  You generally cannot be eligible for healthcare insurance through your employer.  °  How to apply: °Eligibility screenings are held every Tuesday and Wednesday afternoon from 1:00 pm until 4:00 pm. You do not need an appointment for the interview!  °[facility] [address], [place], [place] 336-631-2330   °[facility] [facility]  336-342-8273   °[facility]  336-703-3100   °[facility]  336-570-6415   ° °Behavioral Health Resources in the Community: °Intensive Outpatient Programs °Organization         Address  Phone  Notes  °[facility] [address], [place], [place] 336-878-6098   °[facility] [address], [place], [place] 336-832-9800   °[facility] [address], [place], [place] ° 336-882-2125   °[facility] [address],  °[place], [place] 1-800-853-5163 or 336-641-4981   °Substance Abuse Resources °Organization         Address  Phone  Notes  °[facility]  336-882-2125   °[facility]  336-784-9470   °[facility]  336-285-9073   °[facility]  336-845-3988   °Residential & Outpatient Substance Abuse Program  1-800-659-3381   °Psychological Services °Organization         Address  Phone  Notes  ° Health  336- 832-9600   °[facility]  336- 378-7881   °[facility] [address], [place] 1-800-853-5163 or 336-641-4981   ° °Mobile Crisis Teams °Organization         Address  Phone  Notes  °[facility], Mobile Crisis Care Unit  1-877-626-1772   °Assertive °[facility] ° [address]  [place], [place] 336-834-9664   °Sharon DeEsch [address] °[place]  336-554-5454   ° °Self-Help/Support Groups °Organization         Address  Phone             Notes  °[facility] - variety of support groups  336- 373-1402 Call for more information  °Narcotics Anonymous (NA), [facility] [address], °[place]   2 meetings at this location  ° °  Residential Treatment Programs Organization         Address  Phone  Notes  [facility] [address]    [place]  1-662-309-9555   [facility]  [address], [place] [address], [place], [place]   [place], [place] (424)429-5503 Admissions: 8am-3pm M-F  [facility] [address],    [place], [place] 119-417-4081   [facility] [address] [place], [place], [place]   [facility] [address],  [place] [employer] [place], [place]   [facility] Intensive Outpatient [address], [place], [place]   [facility] [address]  [place], [place] 1-819 117 3027 or 787-129-8458   [facility] [address], [place], [place] Accepts Medicaid  [facility] [address]  [place] 1-(647)148-9865 Substance Abuse/Addiction Treatment   [facility]         Address  Phone  Notes  [facility]  641-501-9584   Domenic Schwab, PhD [address] [place], [place]   (315)647-0148 or (319)103-9823   [facility] [place], [place] (480) [phone]   [facility] [address], [place], [place] (570)534-4368 Insurance/Medicaid/sponsorship through [facility] [address]                                    [place], [place] 603-879-5950 [place] [address], [place] ([phone]) [phone]    Dr. Adele Schilder  [phone]   [facility] Dept. 1) [address] [place] 2) [place] 3)  [address], [place] ([phone]) [phone] [phone]  [phone]   [place] 250-163-3665 or [phone] (After Hours)      Take the prescriptions as directed.  Apply moist heat to the area(s) of discomfort, for 15 minutes at a time, several times per day for the next few days.  Do not fall asleep on a heating pack.  Call your regular OB/GYN doctor tomorrow to schedule a follow up appointment this week.  Return to the Emergency Department immediately if worsening.

## 2014-11-12 NOTE — ED Notes (Signed)
Patient states that she is having abdominal pain, with nausea, vomiting, and diarrhea. States that it started this morning and is not getting any better.

## 2014-11-12 NOTE — ED Provider Notes (Signed)
CSN: [id number]     Arrival date & time 11/12/14  1918 History   First MD Initiated Contact with Patient 11/12/14 2110     Chief Complaint  Patient presents with  . Abdominal Pain  . Back Pain      HPI Pt was seen at 2115.  Per pt, c/o gradual onset and persistence of constant acute flair of her chronic pelvic "pain" since this morning.  Has been associated with multiple intermittent episodes of N/V and radiation into her lower back.  Describes the abd pain as per her usual chronic pain pattern.  Denies any change in her chronic diarrhea. Denies dysuria/hematuria, no vaginal bleeding/discharge, no fevers, no back pain, no rash, no CP/SOB, no black or blood in stools or emesis.      Past Medical History  Diagnosis Date  . Right ankle injury   . Colon polyp   . Rectal polyp   . Carcinoid tumor of rectum   . 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 GERD (gastroesophageal reflux disease)   . Ovarian failure   . Chronic pelvic pain in female   . Kidney stone   . Chronic diarrhea   . Uterine fibroid   . Headache    Past Surgical History  Procedure Laterality Date  . Colonoscopy with esophagogastroduodenoscopy (egd) N/A 05/16/2013    Dr. Gala Romney: rectal polyp with low grade carcinoid tumor, colon polyp with tubulovillous adenoma with high grade dysplasia. Needs repeat colonoscopy in 3 months. EGD with antral erosions, duodenal bulb erosions, superficial ulceration. path with mild chronic gastritis   . Colonoscopy with propofol N/A 09/06/2013    FEO:FHQRFX ileocolonoscopy. No evidence  of residual polyp anywhere. Next TCS 08/2016.  [name] Cholecystectomy     Family History  Problem Relation Age of Onset  . Colon cancer Maternal Grandmother   . Cervical cancer Mother   . Asthma    . Diabetes     Social History  Substance Use Topics  . Smoking status: Current Every Day Smoker -- 1.00 packs/day for 11 years    Types: Cigarettes  . Smokeless tobacco: None   Comment: Smokes one pack of cigarettes daily  . Alcohol Use: Yes     Comment: occasionally    Review of Systems ROS: Statement: All systems negative except as marked or noted in the HPI; Constitutional: Negative for fever and chills. ; ; Eyes: Negative for eye pain, redness and discharge. ; ; ENMT: Negative for ear pain, hoarseness, nasal congestion, sinus pressure and sore throat. ; ; Cardiovascular: Negative for chest pain, palpitations, diaphoresis, dyspnea and peripheral edema. ; ; Respiratory: Negative for cough, wheezing and stridor. ; ; Gastrointestinal: +N/V/D. Negative for blood in stool, hematemesis, jaundice and rectal bleeding. . ; ; Genitourinary: Negative for dysuria, flank pain and hematuria. ; ; GYN:  +pelvic pain. No vaginal bleeding, no vaginal discharge, no vulvar pain. ;; Musculoskeletal: Negative for back pain and neck pain. Negative for swelling and trauma.; ; Skin: Negative for pruritus, rash, abrasions, blisters, bruising and skin lesion.; ; Neuro: Negative for headache, lightheadedness and neck stiffness. Negative for weakness, altered level of consciousness , altered mental status, extremity weakness, paresthesias, involuntary movement, seizure and syncope.      Allergies  Codeine; Erythromycin; Hydromorphone hcl; Nsaids; Penicillins; Bentyl; Latex; and Tape  Home Medications   Prior to Admission medications   Medication Sig Start Date End Date Taking? Authorizing Provider  estradiol (ESTRACE) 1 MG tablet Take 1  tablet (1 mg total) by mouth daily. 10/31/14  Yes Lavonia Drafts, MD  ibuprofen (ADVIL,MOTRIN) 200 MG tablet Take 200 mg by mouth every 6 (six) hours as needed.   Yes Historical Provider, MD  oxyCODONE-acetaminophen (PERCOCET) 7.5-325 MG per tablet Take 1 tablet by mouth every 4 (four) hours as needed for severe pain. Patient not taking: Reported on 11/12/2014 1/61/09   Delora Fuel, MD   BP 604/54 mmHg  Pulse 78  Temp(Src) 98.1 F (36.7 C) (Oral)   Resp 18  Ht 5\' 8"  (1.727 m)  Wt 213 lb (96.616 kg)  BMI 32.39 kg/m2  SpO2 100% Physical Exam  2120: Physical examination:  Nursing notes reviewed; Vital signs and O2 SAT reviewed;  Constitutional: Well developed, Well nourished, Well hydrated, In no acute distress; Head:  Normocephalic, atraumatic; Eyes: EOMI, PERRL, No scleral icterus; ENMT: Mouth and pharynx normal, Mucous membranes moist; Neck: Supple, Full range of motion, No lymphadenopathy; Cardiovascular: Regular rate and rhythm, No murmur, rub, or gallop; Respiratory: Breath sounds clear & equal bilaterally, No rales, rhonchi, wheezes.  Speaking full sentences with ease, Normal respiratory effort/excursion; Chest: Nontender, Movement normal; Abdomen: Soft, +pelvic tenderness. No rebound or guarding. Nondistended, Normal bowel sounds; Genitourinary: No CVA tenderness. Pelvic exam performed with permission of pt and female ED tech assist during exam.  External genitalia w/o lesions. Vaginal vault with thick white discharge.  Cervix w/o lesions, not friable, GC/chlam and wet prep obtained and sent to lab.  Bimanual exam w/o CMT, +suprapubic and bilateral adnexal tenderness.; Spine:  No midline CS, TS, LS tenderness. No rash.;; Extremities: Pulses normal, No tenderness, No edema, No calf edema or asymmetry.; Neuro: AA&Ox3, Major CN grossly intact.  Speech clear. No gross focal motor or sensory deficits in extremities. Climbs on and off stretcher easily by herself. Gait steady.; Skin: Color normal, Warm, Dry.   ED Course  Procedures (including critical care time) Labs Review   Imaging Review  I have personally reviewed and evaluated these images and lab results as part of my medical decision-making.   EKG Interpretation None      MDM  MDM Reviewed: previous chart, nursing note and vitals Reviewed previous: labs and ultrasound Interpretation: labs and CT scan   Results for orders placed or performed during the hospital encounter of  11/12/14  Wet prep, genital  Result Value Ref Range   Yeast Wet Prep HPF POC NONE SEEN NONE SEEN   Trich, Wet Prep NONE SEEN NONE SEEN   Clue Cells Wet Prep HPF POC NONE SEEN NONE SEEN   WBC, Wet Prep HPF POC FEW (A) NONE SEEN  Pregnancy, urine  Result Value Ref Range   Preg Test, Ur NEGATIVE NEGATIVE  Urinalysis, Routine w reflex microscopic (not at [facility])  Result Value Ref Range   Color, Urine YELLOW YELLOW   APPearance CLEAR CLEAR   Specific Gravity, Urine >1.030 (H) 1.005 - 1.030   pH 5.5 5.0 - 8.0   Glucose, UA NEGATIVE NEGATIVE mg/dL   Hgb urine dipstick NEGATIVE NEGATIVE   Bilirubin Urine NEGATIVE NEGATIVE   Ketones, ur NEGATIVE NEGATIVE mg/dL   Protein, ur NEGATIVE NEGATIVE mg/dL   Urobilinogen, UA 1.0 0.0 - 1.0 mg/dL   Nitrite NEGATIVE NEGATIVE   Leukocytes, UA NEGATIVE NEGATIVE  Comprehensive metabolic panel  Result Value Ref Range   Sodium 141 135 - 145 mmol/L   Potassium 4.2 3.5 - 5.1 mmol/L   Chloride 106 101 - 111 mmol/L   CO2 27 22 - 32 mmol/L  Glucose, Bld 96 65 - 99 mg/dL   BUN 14 6 - 20 mg/dL   Creatinine, Ser 0.80 0.44 - 1.00 mg/dL   Calcium 9.3 8.9 - 10.3 mg/dL   Total Protein 7.1 6.5 - 8.1 g/dL   Albumin 4.1 3.5 - 5.0 g/dL   AST 14 (L) 15 - 41 U/L   ALT 15 14 - 54 U/L   Alkaline Phosphatase 79 38 - 126 U/L   Total Bilirubin 0.6 0.3 - 1.2 mg/dL   GFR calc non Af Amer >60 >60 mL/min   GFR calc Af Amer >60 >60 mL/min   Anion gap 8 5 - 15  Lipase, blood  Result Value Ref Range   Lipase 30 22 - 51 U/L  CBC with Differential  Result Value Ref Range   WBC 9.5 4.0 - 10.5 K/uL   RBC 4.74 3.87 - 5.11 MIL/uL   Hemoglobin 14.9 12.0 - 15.0 g/dL   HCT 43.4 36.0 - 46.0 %   MCV 91.6 78.0 - 100.0 fL   MCH 31.4 26.0 - 34.0 pg   MCHC 34.3 30.0 - 36.0 g/dL   RDW 12.8 11.5 - 15.5 %   Platelets 209 150 - 400 K/uL   Neutrophils Relative % 53 43 - 77 %   Neutro Abs 5.1 1.7 - 7.7 K/uL   Lymphocytes Relative 37 12 - 46 %   Lymphs Abs 3.5 0.7 - 4.0 K/uL    Monocytes Relative 5 3 - 12 %   Monocytes Absolute 0.5 0.1 - 1.0 K/uL   Eosinophils Relative 5 0 - 5 %   Eosinophils Absolute 0.4 0.0 - 0.7 K/uL   Basophils Relative 0 0 - 1 %   Basophils Absolute 0.0 0.0 - 0.1 K/uL    Ct Renal Stone Study 11/12/2014   CLINICAL DATA:  Low back pain, onset today.  Nausea vomiting.  EXAM: CT ABDOMEN AND PELVIS WITHOUT CONTRAST  TECHNIQUE: Multidetector CT imaging of the abdomen and pelvis was performed following the standard protocol without IV contrast.  COMPARISON:  11/05/2013  FINDINGS: There are no urinary calculi. There is no hydronephrosis or ureteral dilatation.  There is cholecystectomy. There are normal unenhanced appearances of the liver and bile ducts. There are normal unenhanced appearances of the spleen, pancreas, adrenals and kidneys.  The abdominal aorta is normal in caliber. There is no atherosclerotic calcification. There is no adenopathy in the abdomen or pelvis.  There are normal appearances of the stomach, small bowel and colon. The appendix is normal.  No acute inflammatory changes are evident in the abdomen or pelvis. There is no ascites.  Uterus and ovaries appear unremarkable.  There is no significant abnormality in the lower chest. There is no significant musculoskeletal abnormality. There is a small fat containing umbilical hernia.  IMPRESSION: No significant abnormality.   Electronically Signed   By: Andreas Newport M.D.   On: 11/12/2014 22:19    2300:  Improved after meds and wants to go home now. Workup reassuring; appears acute flair of her chronic pelvic pain at this time. [place] Controlled Substance Database accessed: in the past 4 months, pt has been prescribed 6 different narcotic prescriptions, written by 6 different medical providers, and filled at 2 different pharmacies. Pt aware she will not be prescribed any further narcotics today, as the ED is not the venue to refill chronic pain meds. Pt verb understanding. Dx and testing d/w pt and  family.  Questions answered.  Verb understanding, agreeable to d/c home with  outpt f/u.    Francine Graven, DO 11/17/14 0030

## 2014-11-13 ENCOUNTER — Telehealth: Payer: Self-pay | Admitting: *Deleted

## 2014-11-13 NOTE — Telephone Encounter (Addendum)
Pt left message yesterday @ 1541 stating that she is a pt of Dr. Ihor Dow. She "has been hurting in her ovaries" and wants a nurse to call her back. I returned her today and she stated that she went to ED last night because of the pain. She stated that she had a CT scan which showed kidney stone. She was not given narcotic medication due to the amount of narcotics prescribed within a short period of time and from 6 different providers. Pt had no additional questions.

## 2014-11-14 LAB — GC/CHLAMYDIA PROBE AMP (~~LOC~~) NOT AT ARMC
Chlamydia: NEGATIVE
NEISSERIA GONORRHEA: NEGATIVE

## 2014-12-02 ENCOUNTER — Telehealth: Payer: Self-pay | Admitting: General Practice

## 2014-12-02 NOTE — Telephone Encounter (Signed)
Patient called and left message stating she was wondering if Dr Ihor Dow could increase her estradiol

## 2014-12-03 NOTE — Telephone Encounter (Signed)
Returned patient's call, no answer, voice mail left to call the clinic.

## 2014-12-04 ENCOUNTER — Emergency Department (HOSPITAL_COMMUNITY): Payer: Self-pay

## 2014-12-04 ENCOUNTER — Emergency Department (HOSPITAL_COMMUNITY)
Admission: EM | Admit: 2014-12-04 | Discharge: 2014-12-04 | Disposition: A | Discharge status: Home / Self Care | Payer: Self-pay | Attending: Emergency Medicine | Admitting: Emergency Medicine

## 2014-12-04 ENCOUNTER — Encounter (HOSPITAL_COMMUNITY): Payer: Self-pay | Admitting: *Deleted

## 2014-12-04 DIAGNOSIS — Y998 Other external cause status: Secondary | ICD-10-CM | POA: Insufficient documentation

## 2014-12-04 DIAGNOSIS — Z87442 Personal history of urinary calculi: Secondary | ICD-10-CM | POA: Insufficient documentation

## 2014-12-04 DIAGNOSIS — W228XXA Striking against or struck by other objects, initial encounter: Secondary | ICD-10-CM | POA: Insufficient documentation

## 2014-12-04 DIAGNOSIS — Y9389 Activity, other specified: Secondary | ICD-10-CM | POA: Insufficient documentation

## 2014-12-04 DIAGNOSIS — Y9289 Other specified places as the place of occurrence of the external cause: Secondary | ICD-10-CM | POA: Insufficient documentation

## 2014-12-04 DIAGNOSIS — Z91040 Latex allergy status: Secondary | ICD-10-CM | POA: Insufficient documentation

## 2014-12-04 DIAGNOSIS — Z720 Tobacco use: Secondary | ICD-10-CM | POA: Insufficient documentation

## 2014-12-04 DIAGNOSIS — S60212A Contusion of left wrist, initial encounter: Secondary | ICD-10-CM | POA: Insufficient documentation

## 2014-12-04 DIAGNOSIS — Z8742 Personal history of other diseases of the female genital tract: Secondary | ICD-10-CM | POA: Insufficient documentation

## 2014-12-04 DIAGNOSIS — Z79899 Other long term (current) drug therapy: Secondary | ICD-10-CM | POA: Insufficient documentation

## 2014-12-04 DIAGNOSIS — Z8719 Personal history of other diseases of the digestive system: Secondary | ICD-10-CM | POA: Insufficient documentation

## 2014-12-04 DIAGNOSIS — Z86018 Personal history of other benign neoplasm: Secondary | ICD-10-CM | POA: Insufficient documentation

## 2014-12-04 DIAGNOSIS — G8929 Other chronic pain: Secondary | ICD-10-CM | POA: Insufficient documentation

## 2014-12-04 DIAGNOSIS — Z880 Allergy status to penicillin: Secondary | ICD-10-CM | POA: Insufficient documentation

## 2014-12-04 DIAGNOSIS — Z86010 Personal history of colonic polyps: Secondary | ICD-10-CM | POA: Insufficient documentation

## 2014-12-04 MED ORDER — TRAMADOL HCL 50 MG PO TABS
50.0000 mg | ORAL_TABLET | Freq: Once | ORAL | Status: AC
  Administered 2014-12-04: 50 mg via ORAL
  Filled 2014-12-04: qty 1

## 2014-12-04 MED ORDER — HYDROCODONE-ACETAMINOPHEN 5-325 MG PO TABS: 1.0000 | ORAL_TABLET | Freq: Once | ORAL | Status: DC

## 2014-12-04 MED ORDER — TRAMADOL HCL 50 MG PO TABS: 50.0000 mg | ORAL_TABLET | Freq: Four times a day (QID) | ORAL | Status: DC | PRN

## 2014-12-04 NOTE — Telephone Encounter (Signed)
Patricia Rivers called again today at 10:57 and left a message stating she wants to see if Dr. Maylene Roes- Tamala Julian will increase the dose of her estradiol because 1mg  is not working for her.  Requests a call back.

## 2014-12-04 NOTE — ED Notes (Signed)
Pt states she was whipping her dog and when she went to down she hit the concrete; pt is c/o of pain and swelling to left wrist; pt has positive radial pulse; pt has been icing

## 2014-12-04 NOTE — Discharge Instructions (Signed)
Contusion A contusion is a deep bruise. Contusions are the result of an injury that caused bleeding under the skin. The contusion may turn blue, purple, or yellow. Minor injuries will give you a painless contusion, but more severe contusions may stay painful and swollen for a few weeks.  CAUSES  A contusion is usually caused by a blow, trauma, or direct force to an area of the body. SYMPTOMS   Swelling and redness of the injured area.  Bruising of the injured area.  Tenderness and soreness of the injured area.  Pain. DIAGNOSIS  The diagnosis can be made by taking a history and physical exam. An X-ray, CT scan, or MRI may be needed to determine if there were any associated injuries, such as fractures. TREATMENT  Specific treatment will depend on what area of the body was injured. In general, the best treatment for a contusion is resting, icing, elevating, and applying cold compresses to the injured area. Over-the-counter medicines may also be recommended for pain control. Ask your caregiver what the best treatment is for your contusion. HOME CARE INSTRUCTIONS   Put ice on the injured area.  Put ice in a plastic bag.  Place a towel between your skin and the bag.  Leave the ice on for 15-20 minutes, 3-4 times a day, or as directed by your health care provider.  Only take over-the-counter or prescription medicines for pain, discomfort, or fever as directed by your caregiver. Your caregiver may recommend avoiding anti-inflammatory medicines (aspirin, ibuprofen, and naproxen) for 48 hours because these medicines may increase bruising.  Rest the injured area.  If possible, elevate the injured area to reduce swelling. SEEK IMMEDIATE MEDICAL CARE IF:   You have increased bruising or swelling.  You have pain that is getting worse.  Your swelling or pain is not relieved with medicines. MAKE SURE YOU:   Understand these instructions.  Will watch your condition.  Will get help right  away if you are not doing well or get worse. Document Released: 12/09/2004 Document Revised: 03/06/2013 Document Reviewed: 01/04/2011 [facility] Patient Information 2015 [place], [place]. This information is not intended to replace advice given to you by your health care provider. Make sure you discuss any questions you have with your health care provider.  Use ice and elevation as much as possible for the next several days.  Wear the splint for comfort.  Use caution with taking the pain medication at this may make you drowsy, do not drive within 4 hours of taking tramadol.  Follow-up with Dr. Aline Brochure if your symptoms persist or not improving over the next week.

## 2014-12-04 NOTE — Telephone Encounter (Addendum)
Called Patricia Rivers back and discussed why she thinks the estradiol is not working. She states it is not giving her the umphf Or sex drive like Dr. Maylene Roes- Tamala Julian said it would. I informed her Dr. Ihor Dow is not here today, but I would forward message to her and when We hear back- we will call her. She also states she went to ER for abd pain recently and they did a ct. She states she does not need any pain medicine.

## 2014-12-05 ENCOUNTER — Other Ambulatory Visit: Payer: Self-pay | Admitting: Obstetrics & Gynecology

## 2014-12-05 DIAGNOSIS — R6882 Decreased libido: Secondary | ICD-10-CM

## 2014-12-05 DIAGNOSIS — N951 Menopausal and female climacteric states: Secondary | ICD-10-CM

## 2014-12-05 MED ORDER — EST ESTROGENS-METHYLTEST 1.25-2.5 MG PO TABS: 1.0000 | ORAL_TABLET | Freq: Every day | ORAL | Status: DC

## 2014-12-05 NOTE — Telephone Encounter (Signed)
Pt may take Estratest which will give her the 'boost' that she desires but, it is quite expensive.  She can price it out if desired.  Rx at pharmacy.  clh-S

## 2014-12-05 NOTE — Telephone Encounter (Signed)
Called patient and discussed the new prescription per Dr Ihor Dow. Patient is not sure she can afford the script, but wants to discuss it with her husband. Understands that prescription can be picked up at the front office and if she chooses to take the Estratest she needs to stop taking the estradiol. She plans to call back tomorrow before noon to let us know for sure if she will pick up the prescription.

## 2014-12-05 NOTE — ED Provider Notes (Signed)
CSN: [id number]     Arrival date & time 12/04/14  2056 History   First MD Initiated Contact with Patient 12/04/14 2150     Chief Complaint  Patient presents with  . Wrist Injury     (Consider location/radiation/quality/duration/timing/severity/associated sxs/prior Treatment) The history is provided by the patient.   Patricia Rivers is a 29 y.o. right handed female presenting with left wrist pain which started when she hit the dorsal wrist on the pavement as she was trying to hit her dog who was trying to bite the cable repairman.  She reports immediate pain with radiation into her hands and fingers, pain is worsened with attempts at movement and straightening the wrist (presents holding it in adduction).  She has applied ice with some improvement in pain and complete resolution of swelling.  She has had no medicines prior to arrival for her symptoms.    Past Medical History  Diagnosis Date  . Right ankle injury   . Colon polyp   . Rectal polyp   . Carcinoid tumor of rectum   . 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 GERD (gastroesophageal reflux disease)   . Ovarian failure   . Chronic pelvic pain in female   . Kidney stone   . Chronic diarrhea   . Uterine fibroid   . Headache    Past Surgical History  Procedure Laterality Date  . Colonoscopy with esophagogastroduodenoscopy (egd) N/A 05/16/2013    Dr. Gala Romney: rectal polyp with low grade carcinoid tumor, colon polyp with tubulovillous adenoma with high grade dysplasia. Needs repeat colonoscopy in 3 months. EGD with antral erosions, duodenal bulb erosions, superficial ulceration. path with mild chronic gastritis   . Colonoscopy with propofol N/A 09/06/2013    [facility] ileocolonoscopy. No evidence  of residual polyp anywhere. Next TCS 08/2016.  [name] Cholecystectomy     Family History  Problem Relation Age of Onset  . Colon cancer Maternal Grandmother   . Cervical cancer Mother   . Asthma    . Diabetes      Social History  Substance Use Topics  . Smoking status: Current Every Day Smoker -- 1.00 packs/day for 11 years    Types: Cigarettes  . Smokeless tobacco: None     Comment: Smokes one pack of cigarettes daily  . Alcohol Use: Yes     Comment: occasionally   OB History    No data available     Review of Systems  Constitutional: Negative for fever.  Musculoskeletal: Positive for arthralgias. Negative for myalgias and joint swelling.  Neurological: Negative for weakness and numbness.      Allergies  Codeine; Erythromycin; Hydromorphone hcl; Nsaids; Penicillins; Bentyl; Latex; and Tape  Home Medications   Prior to Admission medications   Medication Sig Start Date End Date Taking? Authorizing Provider  estradiol (ESTRACE) 1 MG tablet Take 1 tablet (1 mg total) by mouth daily. 10/31/14   Lavonia Drafts, MD  ibuprofen (ADVIL,MOTRIN) 200 MG tablet Take 200 mg by mouth every 6 (six) hours as needed.    Historical Provider, MD  naproxen (NAPROSYN) 250 MG tablet Take 1 tablet (250 mg total) by mouth 2 (two) times daily as needed for mild pain or moderate pain (take with food). 11/12/14   Francine Graven, DO  ondansetron (ZOFRAN) 4 MG tablet Take 1 tablet (4 mg total) by mouth every 8 (eight) hours as needed for nausea or vomiting. 11/12/14   Francine Graven, DO  oxyCODONE-acetaminophen (PERCOCET)  7.5-325 MG per tablet Take 1 tablet by mouth every 4 (four) hours as needed for severe pain. Patient not taking: Reported on 11/12/2014 1/32/44   Delora Fuel, MD  traMADol (ULTRAM) 50 MG tablet Take 1 tablet (50 mg total) by mouth every 6 (six) hours as needed. 12/04/14   [name], PA-C   BP 119/81 mmHg  Pulse 85  Temp(Src) 98.2 F (36.8 C) (Oral)  Resp 20  Ht 5\' 7"  (1.702 m)  Wt 230 lb (104.327 kg)  BMI 36.01 kg/m2  SpO2 100% Physical Exam  Constitutional: She appears well-developed and well-nourished.  HENT:  Head: Atraumatic.  Neck: Normal range of motion.  Cardiovascular:   Pulses equal bilaterally  Musculoskeletal: She exhibits tenderness.       Left wrist: She exhibits decreased range of motion and bony tenderness. She exhibits no swelling, no effusion, no crepitus and no deformity.  TTP left dorsal distal lateral wrist without palpable deformity.  Pt is holding her wrist in adduction.  Pain with active movement including flex/ext of wrist and fingers, less pain with passive ROM.  Distal finger cap refill less than 2 sec.  Sensation intact.  No skin injury.  No hematoma or abrasion.  Neurological: She is alert. She has normal strength. She displays normal reflexes. No sensory deficit.  Skin: Skin is warm and dry.  Psychiatric: She has a normal mood and affect.    ED Course  Procedures (including critical care time) Labs Review Labs Reviewed - No data to display  Imaging Review Dg Wrist Complete Left  12/04/2014   CLINICAL DATA:  Generalized left wrist and hand pain. Pt stated she was spanking her dog when she hit her hand on the ground. Wrist was in radial deviated position. Unable to completely straighten and could not do ulnar deviation. NO prior injuries.  EXAM: LEFT WRIST - COMPLETE 3+ VIEW  COMPARISON:  12/05/2013, 12/15/2010  FINDINGS: There is no evidence of fracture or dislocation. There is no evidence of arthropathy or other focal bone abnormality. Soft tissues are unremarkable.  IMPRESSION: Negative.   Electronically Signed   By: Nolon Nations M.D.   On: 12/04/2014 22:18   Dg Hand Complete Left  12/04/2014   CLINICAL DATA:  Left wrist and hand pain. Injury when she in struck hand on the ground. Wrists is deviated radially and unable to completely straighten fingers.  EXAM: LEFT HAND - COMPLETE 3+ VIEW  COMPARISON:  12/05/2013  FINDINGS: No evidence of acute fracture or dislocation. Alignment of the hand and wrist is somewhat rotated but appears unchanged since the previous study. This is likely positional. No focal bone lesion or bone destruction.  Soft tissues are unremarkable.  IMPRESSION: No acute bony abnormalities demonstrated. Alignment is similar to previous studies.   Electronically Signed   By: Lucienne Capers M.D.   On: 12/04/2014 22:15   I have personally reviewed and evaluated these images and lab results as part of my medical decision-making.   EKG Interpretation None      MDM   Final diagnoses:  Wrist contusion, left, initial encounter     Radiological studies were viewed, interpreted and considered during the medical decision making and disposition process. I agree with radiologists reading.  Results were also discussed with patient.   Pt was placed in velcro wrist splint.  Advised RICE,  Tramadol prescribed. Pt does not tolerate nsaids. Advised f/u with Dr Aline Brochure (her orthopedist) if sx persist beyond the next 10 days.  Suspect soft tissue  contusion.  xrays negative for acute injury.   [name], PA-C 12/05/14 0234  Dorie Rank, MD 12/05/14 [phone]

## 2014-12-06 NOTE — Telephone Encounter (Signed)
Returned patient's phone call, she stated she will be sending her husband to pick up her prescription from the front office.

## 2014-12-06 NOTE — Telephone Encounter (Signed)
Patient called, stated she had further questions would like a call back.

## 2015-01-16 ENCOUNTER — Encounter: Payer: Self-pay | Admitting: *Deleted

## 2015-01-20 ENCOUNTER — Encounter (HOSPITAL_COMMUNITY): Payer: Self-pay | Admitting: Emergency Medicine

## 2015-01-20 ENCOUNTER — Emergency Department (HOSPITAL_COMMUNITY)
Admission: EM | Admit: 2015-01-20 | Discharge: 2015-01-20 | Disposition: A | Discharge status: Home / Self Care | Payer: Self-pay | Attending: Emergency Medicine | Admitting: Emergency Medicine

## 2015-01-20 DIAGNOSIS — G8929 Other chronic pain: Secondary | ICD-10-CM | POA: Insufficient documentation

## 2015-01-20 DIAGNOSIS — Z720 Tobacco use: Secondary | ICD-10-CM | POA: Insufficient documentation

## 2015-01-20 DIAGNOSIS — K529 Noninfective gastroenteritis and colitis, unspecified: Secondary | ICD-10-CM | POA: Insufficient documentation

## 2015-01-20 DIAGNOSIS — Z880 Allergy status to penicillin: Secondary | ICD-10-CM | POA: Insufficient documentation

## 2015-01-20 DIAGNOSIS — Z87828 Personal history of other (healed) physical injury and trauma: Secondary | ICD-10-CM | POA: Insufficient documentation

## 2015-01-20 DIAGNOSIS — Z8639 Personal history of other endocrine, nutritional and metabolic disease: Secondary | ICD-10-CM | POA: Insufficient documentation

## 2015-01-20 DIAGNOSIS — Z91040 Latex allergy status: Secondary | ICD-10-CM | POA: Insufficient documentation

## 2015-01-20 DIAGNOSIS — R109 Unspecified abdominal pain: Secondary | ICD-10-CM | POA: Insufficient documentation

## 2015-01-20 DIAGNOSIS — Z87442 Personal history of urinary calculi: Secondary | ICD-10-CM | POA: Insufficient documentation

## 2015-01-20 DIAGNOSIS — Z86018 Personal history of other benign neoplasm: Secondary | ICD-10-CM | POA: Insufficient documentation

## 2015-01-20 DIAGNOSIS — Z3202 Encounter for pregnancy test, result negative: Secondary | ICD-10-CM | POA: Insufficient documentation

## 2015-01-20 DIAGNOSIS — Z86010 Personal history of colonic polyps: Secondary | ICD-10-CM | POA: Insufficient documentation

## 2015-01-20 DIAGNOSIS — Z79899 Other long term (current) drug therapy: Secondary | ICD-10-CM | POA: Insufficient documentation

## 2015-01-20 LAB — COMPREHENSIVE METABOLIC PANEL
ALT: 18 U/L (ref 14–54)
AST: 18 U/L (ref 15–41)
Albumin: 4.2 g/dL (ref 3.5–5.0)
Alkaline Phosphatase: 94 U/L (ref 38–126)
Anion gap: 8 (ref 5–15)
BUN: 18 mg/dL (ref 6–20)
CHLORIDE: 106 mmol/L (ref 101–111)
CO2: 25 mmol/L (ref 22–32)
Calcium: 9.4 mg/dL (ref 8.9–10.3)
Creatinine, Ser: 0.77 mg/dL (ref 0.44–1.00)
GFR calc Af Amer: >60 mL/min (ref >60)
Glucose, Bld: 90 mg/dL (ref 65–99)
POTASSIUM: 4.2 mmol/L (ref 3.5–5.1)
Sodium: 139 mmol/L (ref 135–145)
Total Bilirubin: 0.5 mg/dL (ref 0.3–1.2)
Total Protein: 7.2 g/dL (ref 6.5–8.1)

## 2015-01-20 LAB — URINALYSIS, ROUTINE W REFLEX MICROSCOPIC
BILIRUBIN URINE: NEGATIVE
Glucose, UA: NEGATIVE mg/dL
Hgb urine dipstick: NEGATIVE
KETONES UR: NEGATIVE mg/dL
Leukocytes, UA: NEGATIVE
NITRITE: NEGATIVE
PROTEIN: NEGATIVE mg/dL
Specific Gravity, Urine: >1.03 — ABNORMAL HIGH (ref 1.005–1.030)
UROBILINOGEN UA: 0.2 mg/dL (ref 0.0–1.0)
pH: 5.5 (ref 5.0–8.0)

## 2015-01-20 LAB — CBC WITH DIFFERENTIAL/PLATELET
BASOS ABS: 0 10*3/uL (ref 0.0–0.1)
Basophils Relative: 0 %
EOS ABS: 0.5 10*3/uL (ref 0.0–0.7)
EOS PCT: 5 %
HCT: 45 % (ref 36.0–46.0)
Hemoglobin: 15.7 g/dL — ABNORMAL HIGH (ref 12.0–15.0)
Lymphocytes Relative: 32 %
Lymphs Abs: 2.9 10*3/uL (ref 0.7–4.0)
MCH: 31.8 pg (ref 26.0–34.0)
MCHC: 34.9 g/dL (ref 30.0–36.0)
MCV: 91.1 fL (ref 78.0–100.0)
Monocytes Absolute: 0.6 10*3/uL (ref 0.1–1.0)
Monocytes Relative: 7 %
Neutro Abs: 5 10*3/uL (ref 1.7–7.7)
Neutrophils Relative %: 56 %
PLATELETS: 195 10*3/uL (ref 150–400)
RBC: 4.94 MIL/uL (ref 3.87–5.11)
RDW: 12.5 % (ref 11.5–15.5)
WBC: 9 10*3/uL (ref 4.0–10.5)

## 2015-01-20 LAB — PREGNANCY, URINE: PREG TEST UR: NEGATIVE

## 2015-01-20 LAB — LIPASE, BLOOD: LIPASE: 30 U/L (ref 11–51)

## 2015-01-20 MED ORDER — ONDANSETRON 4 MG PO TBDP: 4.0000 mg | ORAL_TABLET | Freq: Three times a day (TID) | ORAL | Status: DC | PRN

## 2015-01-20 MED ORDER — LOPERAMIDE HCL 2 MG PO TABS: 2.0000 mg | ORAL_TABLET | Freq: Four times a day (QID) | ORAL | Status: DC | PRN

## 2015-01-20 MED ORDER — SODIUM CHLORIDE 0.9 % IV BOLUS (SEPSIS)
1000.0000 mL | Freq: Once | INTRAVENOUS | Status: AC
  Administered 2015-01-20: 1000 mL via INTRAVENOUS

## 2015-01-20 MED ORDER — PROMETHAZINE HCL 25 MG PO TABS: 25.0000 mg | ORAL_TABLET | Freq: Four times a day (QID) | ORAL | Status: DC | PRN

## 2015-01-20 MED ORDER — SODIUM CHLORIDE 0.9 % IV SOLN: INTRAVENOUS | Status: DC

## 2015-01-20 MED ORDER — ONDANSETRON HCL 4 MG/2ML IJ SOLN
4.0000 mg | Freq: Once | INTRAMUSCULAR | Status: AC
  Administered 2015-01-20: 4 mg via INTRAVENOUS
  Filled 2015-01-20: qty 2

## 2015-01-20 NOTE — ED Provider Notes (Signed)
CSN: [id number]     Arrival date & time 01/20/15  1241 History   First MD Initiated Contact with Patient 01/20/15 1559     Chief Complaint  Patient presents with  . Cough  . Fever  . Diarrhea  . Emesis     (Consider location/radiation/quality/duration/timing/severity/associated sxs/prior Treatment) Patient is a 29 y.o. female presenting with cough, fever, diarrhea, and vomiting. The history is provided by the patient.  Cough Associated symptoms: no chest pain, no fever, no headaches, no rash and no shortness of breath   Fever Associated symptoms: cough, diarrhea, nausea and vomiting   Associated symptoms: no chest pain, no confusion, no congestion, no dysuria, no headaches and no rash   Diarrhea Associated symptoms: abdominal pain and vomiting   Associated symptoms: no fever and no headaches   Emesis Associated symptoms: abdominal pain and diarrhea   Associated symptoms: no headaches    patient with acute onset of nausea vomiting and diarrhea shortly after midnight. Several hours later started with some abdominal pain patient stating is 10 out of 10 mostly in the right upper quadrant. Patient vomited 10 times had diarrhea 4 times. No blood in either one. Pain is nonradiating to the back.  Past Medical History  Diagnosis Date  . Right ankle injury   . Colon polyp   . Rectal polyp   . Carcinoid tumor of rectum   . 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 GERD (gastroesophageal reflux disease)   . Ovarian failure   . Chronic pelvic pain in female   . Kidney stone   . Chronic diarrhea   . Uterine fibroid   . Headache    Past Surgical History  Procedure Laterality Date  . Colonoscopy with esophagogastroduodenoscopy (egd) N/A 05/16/2013    Dr. Gala Romney: rectal polyp with low grade carcinoid tumor, colon polyp with tubulovillous adenoma with high grade dysplasia. Needs repeat colonoscopy in 3 months. EGD with antral erosions, duodenal bulb erosions,  superficial ulceration. path with mild chronic gastritis   . Colonoscopy with propofol N/A 09/06/2013    [facility]:GOT[facility] ileocolonoscopy. No evidence  of residual polyp anywhere. Next TCS 08/2016.  [name] Cholecystectomy     Family History  Problem Relation Age of Onset  . Colon cancer Maternal Grandmother   . Cervical cancer Mother   . Asthma    . Diabetes     Social History  Substance Use Topics  . Smoking status: Current Every Day Smoker -- 1.00 packs/day for 11 years    Types: Cigarettes  . Smokeless tobacco: None     Comment: Smokes one pack of cigarettes daily  . Alcohol Use: Yes     Comment: occasionally   OB History    No data available     Review of Systems  Constitutional: Negative for fever.  HENT: Negative for congestion.   Eyes: Negative for redness.  Respiratory: Positive for cough. Negative for shortness of breath.   Cardiovascular: Negative for chest pain.  Gastrointestinal: Positive for nausea, vomiting, abdominal pain and diarrhea.  Genitourinary: Negative for dysuria.  Skin: Negative for rash.  Neurological: Negative for headaches.  Hematological: Does not bruise/bleed easily.  Psychiatric/Behavioral: Negative for confusion.      Allergies  Codeine; Erythromycin; Hydromorphone hcl; Nsaids; Penicillins; Bentyl; Latex; and Tape  Home Medications   Prior to Admission medications   Medication Sig Start Date End Date Taking? Authorizing Provider  estradiol (ESTRACE) 1 MG tablet Take 1 tablet (1 mg total)  by mouth daily. 10/31/14  Yes Lavonia Drafts, MD  naproxen sodium (ALEVE) 220 MG tablet Take 220,440 mg by mouth daily as needed (for pain/headache).   Yes Historical Provider, MD  estrogens-methylTEST 1.25-2.5 MG TABS per tablet Take 1 tablet by mouth daily. Patient not taking: Reported on 01/20/2015 12/05/14   Lavonia Drafts, MD  loperamide (IMODIUM A-D) 2 MG tablet Take 1 tablet (2 mg total) by mouth 4 (four) times daily as needed for diarrhea or  loose stools. 01/20/15   Fredia Sorrow, MD  ondansetron (ZOFRAN ODT) 4 MG disintegrating tablet Take 1 tablet (4 mg total) by mouth every 8 (eight) hours as needed. 01/20/15   Fredia Sorrow, MD  ondansetron (ZOFRAN) 4 MG tablet Take 1 tablet (4 mg total) by mouth every 8 (eight) hours as needed for nausea or vomiting. Patient not taking: Reported on 01/20/2015 11/12/14   Francine Graven, DO  oxyCODONE-acetaminophen (PERCOCET) 7.5-325 MG per tablet Take 1 tablet by mouth every 4 (four) hours as needed for severe pain. Patient not taking: Reported on 11/12/2014 06/22/79   Delora Fuel, MD  promethazine (PHENERGAN) 25 MG tablet Take 1 tablet (25 mg total) by mouth every 6 (six) hours as needed. 01/20/15   Fredia Sorrow, MD  traMADol (ULTRAM) 50 MG tablet Take 1 tablet (50 mg total) by mouth every 6 (six) hours as needed. Patient not taking: Reported on 01/20/2015 12/04/14   [name], PA-C   BP 111/72 mmHg  Pulse 69  Temp(Src) 98.1 F (36.7 C) (Oral)  Resp 20  Ht 5\' 7"  (1.702 m)  Wt 230 lb (104.327 kg)  BMI 36.01 kg/m2  SpO2 98% Physical Exam  Constitutional: She is oriented to person, place, and time. She appears well-developed and well-nourished. No distress.  HENT:  Head: Normocephalic and atraumatic.  Mouth/Throat: Oropharynx is clear and moist.  Eyes: Conjunctivae and EOM are normal. Pupils are equal, round, and reactive to light.  Neck: Normal range of motion. Neck supple.  Cardiovascular: Normal rate, regular rhythm and normal heart sounds.   No murmur heard. Pulmonary/Chest: Effort normal and breath sounds normal. No respiratory distress.  Abdominal: Soft. Bowel sounds are normal. There is tenderness. There is no guarding.  Mild tenderness right upper quadrant no guarding  Musculoskeletal: Normal range of motion.  Neurological: She is alert and oriented to person, place, and time. No cranial nerve deficit. She exhibits normal muscle tone. Coordination normal.  Nursing note and  vitals reviewed.   ED Course  Procedures (including critical care time) Labs Review Labs Reviewed  URINALYSIS, ROUTINE W REFLEX MICROSCOPIC (NOT AT [facility]) - Abnormal; Notable for the following:    Specific Gravity, Urine >1.030 (*)    All other components within normal limits  CBC WITH DIFFERENTIAL/PLATELET - Abnormal; Notable for the following:    Hemoglobin 15.7 (*)    All other components within normal limits  PREGNANCY, URINE  LIPASE, BLOOD  COMPREHENSIVE METABOLIC PANEL   Results for orders placed or performed during the hospital encounter of 01/20/15  Urinalysis, Routine w reflex microscopic (not at [facility])  Result Value Ref Range   Color, Urine YELLOW YELLOW   APPearance CLEAR CLEAR   Specific Gravity, Urine >1.030 (H) 1.005 - 1.030   pH 5.5 5.0 - 8.0   Glucose, UA NEGATIVE NEGATIVE mg/dL   Hgb urine dipstick NEGATIVE NEGATIVE   Bilirubin Urine NEGATIVE NEGATIVE   Ketones, ur NEGATIVE NEGATIVE mg/dL   Protein, ur NEGATIVE NEGATIVE mg/dL   Urobilinogen, UA 0.2 0.0 - 1.0  mg/dL   Nitrite NEGATIVE NEGATIVE   Leukocytes, UA NEGATIVE NEGATIVE  Pregnancy, urine  Result Value Ref Range   Preg Test, Ur NEGATIVE NEGATIVE  CBC with Differential/Platelet  Result Value Ref Range   WBC 9.0 4.0 - 10.5 K/uL   RBC 4.94 3.87 - 5.11 MIL/uL   Hemoglobin 15.7 (H) 12.0 - 15.0 g/dL   HCT 45.0 36.0 - 46.0 %   MCV 91.1 78.0 - 100.0 fL   MCH 31.8 26.0 - 34.0 pg   MCHC 34.9 30.0 - 36.0 g/dL   RDW 12.5 11.5 - 15.5 %   Platelets 195 150 - 400 K/uL   Neutrophils Relative % 56 %   Neutro Abs 5.0 1.7 - 7.7 K/uL   Lymphocytes Relative 32 %   Lymphs Abs 2.9 0.7 - 4.0 K/uL   Monocytes Relative 7 %   Monocytes Absolute 0.6 0.1 - 1.0 K/uL   Eosinophils Relative 5 %   Eosinophils Absolute 0.5 0.0 - 0.7 K/uL   Basophils Relative 0 %   Basophils Absolute 0.0 0.0 - 0.1 K/uL  Lipase, blood  Result Value Ref Range   Lipase 30 11 - 51 U/L  Comprehensive metabolic panel  Result Value Ref Range    Sodium 139 135 - 145 mmol/L   Potassium 4.2 3.5 - 5.1 mmol/L   Chloride 106 101 - 111 mmol/L   CO2 25 22 - 32 mmol/L   Glucose, Bld 90 65 - 99 mg/dL   BUN 18 6 - 20 mg/dL   Creatinine, Ser 0.77 0.44 - 1.00 mg/dL   Calcium 9.4 8.9 - 10.3 mg/dL   Total Protein 7.2 6.5 - 8.1 g/dL   Albumin 4.2 3.5 - 5.0 g/dL   AST 18 15 - 41 U/L   ALT 18 14 - 54 U/L   Alkaline Phosphatase 94 38 - 126 U/L   Total Bilirubin 0.5 0.3 - 1.2 mg/dL   GFR calc non Af Amer >60 >60 mL/min   GFR calc Af Amer >60 >60 mL/min   Anion gap 8 5 - 15     Imaging Review No results found. I have personally reviewed and evaluated these images and lab results as part of my medical decision-making.   EKG Interpretation None      MDM   Final diagnoses:  Gastroenteritis    Patient with a nonsurgical abdomen. Patient with acute onset of nausea vomiting and diarrhea shortly after midnight. Patient later developed abdominal pain which is predominantly in the right upper quadrant area reported as 10 out of 10. Patient vomited 10 times and has had 4 loose bowel movements no blood in either one. Labs without any significant abnormalities. No fever here. Patient nontoxic no acute distress. Patient improved significantly with Zofran here. Patient will be discharged home with Phenergan Zofran and Imodium right ear. Feel that this is a viral gastroenteritis. Patient will return for any worsening of abdominal pain.  Patient's had a history in the past of some chronic abdominal pain problems.    Fredia Sorrow, MD 01/20/15 1930

## 2015-01-20 NOTE — Discharge Instructions (Signed)
Food Choices to Help Relieve Diarrhea, Adult When you have diarrhea, the foods you eat and your eating habits are very important. Choosing the right foods and drinks can help relieve diarrhea. Also, because diarrhea can last up to 7 days, you need to replace lost fluids and electrolytes (such as sodium, potassium, and chloride) in order to help prevent dehydration.  WHAT GENERAL GUIDELINES DO I NEED TO FOLLOW?  Slowly drink 1 cup (8 oz) of fluid for each episode of diarrhea. If you are getting enough fluid, your urine will be clear or pale yellow.  Eat starchy foods. Some good choices include white rice, white toast, pasta, low-fiber cereal, baked potatoes (without the skin), saltine crackers, and bagels.  Avoid large servings of any cooked vegetables.  Limit fruit to two servings per day. A serving is  cup or 1 small piece.  Choose foods with less than 2 g of fiber per serving.  Limit fats to less than 8 tsp (38 g) per day.  Avoid fried foods.  Eat foods that have probiotics in them. Probiotics can be found in certain dairy products.  Avoid foods and beverages that may increase the speed at which food moves through the stomach and intestines (gastrointestinal tract). Things to avoid include:  High-fiber foods, such as dried fruit, raw fruits and vegetables, nuts, seeds, and whole grain foods.  Spicy foods and high-fat foods.  Foods and beverages sweetened with high-fructose corn syrup, honey, or sugar alcohols such as xylitol, sorbitol, and mannitol. WHAT FOODS ARE RECOMMENDED? Grains White rice. White, [place], or pita breads (fresh or toasted), including plain rolls, buns, or bagels. White pasta. Saltine, soda, or graham crackers. Pretzels. Low-fiber cereal. Cooked cereals made with water (such as cornmeal, farina, or cream cereals). Plain muffins. Matzo. Melba toast. Zwieback.  Vegetables Potatoes (without the skin). Strained tomato and vegetable juices. Most well-cooked and canned  vegetables without seeds. Tender lettuce. Fruits Cooked or canned applesauce, apricots, cherries, fruit cocktail, grapefruit, peaches, pears, or plums. Fresh bananas, apples without skin, cherries, grapes, cantaloupe, grapefruit, peaches, oranges, or plums.  Meat and Other Protein Products Baked or boiled chicken. Eggs. Tofu. Fish. Seafood. Smooth peanut butter. Ground or well-cooked tender beef, ham, veal, lamb, pork, or poultry.  Dairy Plain yogurt, kefir, and unsweetened liquid yogurt. Lactose-free milk, buttermilk, or soy milk. Plain hard cheese. Beverages Sport drinks. Clear broths. Diluted fruit juices (except prune). Regular, caffeine-free sodas such as ginger ale. Water. Decaffeinated teas. Oral rehydration solutions. Sugar-free beverages not sweetened with sugar alcohols. Other Bouillon, broth, or soups made from recommended foods.  The items listed above may not be a complete list of recommended foods or beverages. Contact your dietitian for more options. WHAT FOODS ARE NOT RECOMMENDED? Grains Whole grain, whole wheat, bran, or rye breads, rolls, pastas, crackers, and cereals. Wild or brown rice. Cereals that contain more than 2 g of fiber per serving. Corn tortillas or taco shells. Cooked or dry oatmeal. Granola. Popcorn. Vegetables Raw vegetables. Cabbage, broccoli, Brussels sprouts, artichokes, baked beans, beet greens, corn, kale, legumes, peas, sweet potatoes, and yams. Potato skins. Cooked spinach and cabbage. Fruits Dried fruit, including raisins and dates. Raw fruits. Stewed or dried prunes. Fresh apples with skin, apricots, mangoes, pears, raspberries, and strawberries.  Meat and Other Protein Products Chunky peanut butter. Nuts and seeds. Beans and lentils. Berniece Salines.  Dairy High-fat cheeses. Milk, chocolate milk, and beverages made with milk, such as milk shakes. Cream. Ice cream. Sweets and Desserts Sweet rolls, doughnuts, and sweet breads.  Pancakes and waffles. Fats and  Oils Butter. Cream sauces. Margarine. Salad oils. Plain salad dressings. Olives. Avocados.  Beverages Caffeinated beverages (such as coffee, tea, soda, or energy drinks). Alcoholic beverages. Fruit juices with pulp. Prune juice. Soft drinks sweetened with high-fructose corn syrup or sugar alcohols. Other Coconut. Hot sauce. Chili powder. Mayonnaise. Gravy. Cream-based or milk-based soups.  The items listed above may not be a complete list of foods and beverages to avoid. Contact your dietitian for more information. WHAT SHOULD I DO IF I BECOME DEHYDRATED? Diarrhea can sometimes lead to dehydration. Signs of dehydration include dark urine and dry mouth and skin. If you think you are dehydrated, you should rehydrate with an oral rehydration solution. These solutions can be purchased at pharmacies, retail stores, or online.  Drink -1 cup (120-240 mL) of oral rehydration solution each time you have an episode of diarrhea. If drinking this amount makes your diarrhea worse, try drinking smaller amounts more often. For example, drink 1-3 tsp (5-15 mL) every 5-10 minutes.  A general rule for staying hydrated is to drink 1-2 L of fluid per day. Talk to your health care provider about the specific amount you should be drinking each day. Drink enough fluids to keep your urine clear or pale yellow.  Take the Zofran and/or Phenergan as needed for nausea and vomiting. Take Imodium as needed for diarrhea. Return for any new or worse symptoms. Specifically return for any worsening of the abdominal pain. Clinically were thinking this is a viral stomach bug and would expect you to be improving over the next 1-2 days. Would not expect you to get significantly worse at this time.   This information is not intended to replace advice given to you by your health care provider. Make sure you discuss any questions you have with your health care provider.     Document Released: 05/22/2003 Document Revised: 03/22/2014  Document Reviewed: 01/22/2013 Elsevier Interactive Patient Education [employer].

## 2015-01-20 NOTE — ED Notes (Signed)
Having N/V/D since 0030.  C/o abdominal pain 10/10.  Vomited about 10 times and diarrhea about 4 times.

## 2015-01-23 ENCOUNTER — Ambulatory Visit: Payer: Self-pay | Admitting: Gastroenterology

## 2015-03-09 ENCOUNTER — Emergency Department (HOSPITAL_COMMUNITY)
Admission: EM | Admit: 2015-03-09 | Discharge: 2015-03-09 | Disposition: A | Discharge status: Home / Self Care | Payer: Self-pay | Attending: Emergency Medicine | Admitting: Emergency Medicine

## 2015-03-09 ENCOUNTER — Encounter (HOSPITAL_COMMUNITY): Payer: Self-pay | Admitting: Emergency Medicine

## 2015-03-09 DIAGNOSIS — Z8719 Personal history of other diseases of the digestive system: Secondary | ICD-10-CM | POA: Insufficient documentation

## 2015-03-09 DIAGNOSIS — Z8742 Personal history of other diseases of the female genital tract: Secondary | ICD-10-CM | POA: Insufficient documentation

## 2015-03-09 DIAGNOSIS — J069 Acute upper respiratory infection, unspecified: Secondary | ICD-10-CM | POA: Insufficient documentation

## 2015-03-09 DIAGNOSIS — R1110 Vomiting, unspecified: Secondary | ICD-10-CM | POA: Insufficient documentation

## 2015-03-09 DIAGNOSIS — Z86018 Personal history of other benign neoplasm: Secondary | ICD-10-CM | POA: Insufficient documentation

## 2015-03-09 DIAGNOSIS — Z79899 Other long term (current) drug therapy: Secondary | ICD-10-CM | POA: Insufficient documentation

## 2015-03-09 DIAGNOSIS — Z87442 Personal history of urinary calculi: Secondary | ICD-10-CM | POA: Insufficient documentation

## 2015-03-09 DIAGNOSIS — Z91040 Latex allergy status: Secondary | ICD-10-CM | POA: Insufficient documentation

## 2015-03-09 DIAGNOSIS — Z85038 Personal history of other malignant neoplasm of large intestine: Secondary | ICD-10-CM | POA: Insufficient documentation

## 2015-03-09 DIAGNOSIS — F17210 Nicotine dependence, cigarettes, uncomplicated: Secondary | ICD-10-CM | POA: Insufficient documentation

## 2015-03-09 DIAGNOSIS — J029 Acute pharyngitis, unspecified: Secondary | ICD-10-CM | POA: Insufficient documentation

## 2015-03-09 DIAGNOSIS — Z86010 Personal history of colonic polyps: Secondary | ICD-10-CM | POA: Insufficient documentation

## 2015-03-09 DIAGNOSIS — Z87828 Personal history of other (healed) physical injury and trauma: Secondary | ICD-10-CM | POA: Insufficient documentation

## 2015-03-09 DIAGNOSIS — G8929 Other chronic pain: Secondary | ICD-10-CM | POA: Insufficient documentation

## 2015-03-09 LAB — RAPID STREP SCREEN (MED CTR MEBANE ONLY): Streptococcus, Group A Screen (Direct): NEGATIVE

## 2015-03-09 MED ORDER — HYDROCOD POLST-CPM POLST ER 10-8 MG/5ML PO SUER
5.0000 mL | Freq: Once | ORAL | Status: AC
  Administered 2015-03-09: 5 mL via ORAL
  Filled 2015-03-09: qty 5

## 2015-03-09 MED ORDER — BENZONATATE 100 MG PO CAPS
200.0000 mg | ORAL_CAPSULE | Freq: Once | ORAL | Status: AC
  Administered 2015-03-09: 200 mg via ORAL
  Filled 2015-03-09: qty 2

## 2015-03-09 MED ORDER — BENZONATATE 100 MG PO CAPS: 200.0000 mg | ORAL_CAPSULE | Freq: Three times a day (TID) | ORAL | Status: DC | PRN

## 2015-03-09 MED ORDER — HYDROCOD POLST-CPM POLST ER 10-8 MG/5ML PO SUER: 5.0000 mL | Freq: Two times a day (BID) | ORAL | Status: DC

## 2015-03-09 NOTE — Discharge Instructions (Signed)

## 2015-03-09 NOTE — ED Notes (Signed)
Patient complaining of sore throat and vomiting since yesterday. States she has vomited 3 times in 24 hours.

## 2015-03-09 NOTE — ED Provider Notes (Signed)
CSN: [id number]     Arrival date & time 03/09/15  1857 History  By signing my name below, I, Patricia Rivers, attest that this documentation has been prepared under the direction and in the presence of Patricia Jefferson, PA-C. Electronically Signed: Jolayne Rivers, Scribe. 03/09/2015. 7:57 PM.   Chief Complaint  Patient presents with  . Sore Throat  . Emesis   The history is provided by the patient. No language interpreter was used.    HPI Comments: Patricia Rivers is a 29 y.o. female who presents to the Emergency Department complaining of onset of a sore throat with associated tickling in her throat for two days with worsening symptoms this morning. Pt notes associated nasal congestion with clear drainage, productive cough with production of green sputum, difficulty speaking secondary to throat pain, and three episodes of post-tussive emesis since yesterday. Pt has tried nyquil, dayquil, and cough drops with no relief. Pt denies SOB, abdominal pain, and ear pain. When asked to note her LNMP she reports that she no longer has a period because she was on a depo-provera birth control shot for 13 years straight. She notes that she smokes slightly less than a pack of cigarettes per day.   PCP: none  Past Medical History  Diagnosis Date  . Right ankle injury   . Colon polyp   . Rectal polyp   . Carcinoid tumor of rectum   . 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 GERD (gastroesophageal reflux disease)   . Ovarian failure   . Chronic pelvic pain in female   . Kidney stone   . Chronic diarrhea   . Uterine fibroid   . Headache    Past Surgical History  Procedure Laterality Date  . Colonoscopy with esophagogastroduodenoscopy (egd) N/A 05/16/2013    Dr. Gala Romney: rectal polyp with low grade carcinoid tumor, colon polyp with tubulovillous adenoma with high grade dysplasia. Needs repeat colonoscopy in 3 months. EGD with antral erosions, duodenal bulb erosions,  superficial ulceration. path with mild chronic gastritis   . Colonoscopy with propofol N/A 09/06/2013    [id number] ileocolonoscopy. No evidence  of residual polyp anywhere. Next TCS 08/2016.  [name] Cholecystectomy     Family History  Problem Relation Age of Onset  . Colon cancer Maternal Grandmother   . Cervical cancer Mother   . Asthma    . Diabetes     Social History  Substance Use Topics  . Smoking status: Current Every Day Smoker -- 1.00 packs/day for 11 years    Types: Cigarettes  . Smokeless tobacco: None     Comment: Smokes one pack of cigarettes daily  . Alcohol Use: Yes     Comment: occasionally   OB History    No data available     Review of Systems  HENT: Positive for congestion and sore throat. Negative for ear pain.   Respiratory: Positive for cough. Negative for shortness of breath.   Gastrointestinal: Positive for vomiting. Negative for abdominal pain.   Allergies  Codeine; Erythromycin; Hydromorphone hcl; Nsaids; Penicillins; Bentyl; Latex; and Tape  Home Medications   Prior to Admission medications   Medication Sig Start Date End Date Taking? Authorizing Provider  aspirin-acetaminophen-caffeine (EXCEDRIN MIGRAINE) [id number] MG tablet Take 2 tablets by mouth every 6 (six) hours as needed for headache.   Yes Historical Provider, MD  loperamide (IMODIUM A-D) 2 MG tablet Take 1 tablet (2 mg total) by mouth 4 (four) times daily  as needed for diarrhea or loose stools. 01/20/15  Yes Fredia Sorrow, MD  naproxen sodium (ALEVE) 220 MG tablet Take 220,440 mg by mouth daily as needed (for pain/headache).   Yes Historical Provider, MD  ondansetron (ZOFRAN ODT) 4 MG disintegrating tablet Take 1 tablet (4 mg total) by mouth every 8 (eight) hours as needed. 01/20/15  Yes Fredia Sorrow, MD  benzonatate (TESSALON) 100 MG capsule Take 2 capsules (200 mg total) by mouth 3 (three) times daily as needed. 03/09/15   Patricia Jefferson, PA-C  chlorpheniramine-HYDROcodone (TUSSIONEX  PENNKINETIC ER) 10-8 MG/5ML SUER Take 5 mLs by mouth 2 (two) times daily. 03/09/15   Patricia Jefferson, PA-C  estrogens-methylTEST 1.25-2.5 MG TABS per tablet Take 1 tablet by mouth daily. Patient not taking: Reported on 01/20/2015 12/05/14   Lavonia Drafts, MD  ondansetron (ZOFRAN) 4 MG tablet Take 1 tablet (4 mg total) by mouth every 8 (eight) hours as needed for nausea or vomiting. Patient not taking: Reported on 01/20/2015 11/12/14   Francine Graven, DO  traMADol (ULTRAM) 50 MG tablet Take 1 tablet (50 mg total) by mouth every 6 (six) hours as needed. Patient not taking: Reported on 01/20/2015 12/04/14   Patricia Jefferson, PA-C   BP 121/72 mmHg  Pulse 76  Temp(Src) 98 F (36.7 C) (Oral)  Resp 14  Ht 5\' 8"  (1.727 m)  Wt 104.327 kg  BMI 34.98 kg/m2  SpO2 97% Physical Exam  Constitutional: She is oriented to person, place, and time. She appears well-developed and well-nourished. No distress.  HENT:  Head: Normocephalic and atraumatic.  Right Ear: External ear normal.  Left Ear: External ear normal.  Mild posterior pharyngeal erythema without exudate or swelling  Eyes: Conjunctivae are normal.  Cardiovascular: Normal rate, regular rhythm and normal heart sounds.   Pulmonary/Chest: Effort normal and breath sounds normal.  Abdominal: Soft. She exhibits no distension.  Lymphadenopathy:    She has no cervical adenopathy.  Neurological: She is alert and oriented to person, place, and time.  Skin: Skin is warm and dry.  Psychiatric: She has a normal mood and affect. Her behavior is normal. Judgment and thought content normal.  Nursing note and vitals reviewed.   ED Course  Procedures  DIAGNOSTIC STUDIES:    Oxygen Saturation is 97% on RA, adequate by my interpretation.   COORDINATION OF CARE:  7:27 PM Will prescribe pt tessalon. Discussed treatment plan with pt at bedside and pt agreed to plan.   MDM   Final diagnoses:  Acute URI  Viral pharyngitis    Patients labs reviewed.     Results were also discussed with patient. Strep negative, suspect viral uri/pharygitis.  She was given tessalon while here with some improvement in cough sx.  Prescribed same along with tussionex for qhs use.  Advised rest, increased fluid intake, f/u prn.    The patient appears reasonably screened and/or stabilized for discharge and I doubt any other medical condition or other [facility] requiring further screening, evaluation, or treatment in the ED at this time prior to discharge.  I personally performed the services described in this documentation, which was scribed in my presence. The recorded information has been reviewed and is accurate.   Patricia Jefferson, PA-C 03/10/15 1337  Noemi Chapel, MD 03/11/15 [phone]

## 2015-03-12 LAB — CULTURE, GROUP A STREP

## 2015-03-17 ENCOUNTER — Inpatient Hospital Stay (HOSPITAL_COMMUNITY): Payer: Self-pay

## 2015-03-17 ENCOUNTER — Encounter (HOSPITAL_COMMUNITY): Payer: Self-pay | Admitting: *Deleted

## 2015-03-17 ENCOUNTER — Inpatient Hospital Stay (HOSPITAL_COMMUNITY)
Admission: AD | Admit: 2015-03-17 | Discharge: 2015-03-17 | Disposition: A | Discharge status: Home / Self Care | Payer: Self-pay | Source: Ambulatory Visit | Attending: Family Medicine | Admitting: Family Medicine

## 2015-03-17 ENCOUNTER — Telehealth: Payer: Self-pay

## 2015-03-17 DIAGNOSIS — Z888 Allergy status to other drugs, medicaments and biological substances status: Secondary | ICD-10-CM | POA: Insufficient documentation

## 2015-03-17 DIAGNOSIS — Z880 Allergy status to penicillin: Secondary | ICD-10-CM | POA: Insufficient documentation

## 2015-03-17 DIAGNOSIS — Z7982 Long term (current) use of aspirin: Secondary | ICD-10-CM | POA: Insufficient documentation

## 2015-03-17 DIAGNOSIS — K219 Gastro-esophageal reflux disease without esophagitis: Secondary | ICD-10-CM | POA: Insufficient documentation

## 2015-03-17 DIAGNOSIS — F17210 Nicotine dependence, cigarettes, uncomplicated: Secondary | ICD-10-CM | POA: Insufficient documentation

## 2015-03-17 DIAGNOSIS — E2839 Other primary ovarian failure: Secondary | ICD-10-CM | POA: Insufficient documentation

## 2015-03-17 DIAGNOSIS — R102 Pelvic and perineal pain: Secondary | ICD-10-CM | POA: Insufficient documentation

## 2015-03-17 DIAGNOSIS — D259 Leiomyoma of uterus, unspecified: Secondary | ICD-10-CM | POA: Insufficient documentation

## 2015-03-17 DIAGNOSIS — R109 Unspecified abdominal pain: Secondary | ICD-10-CM | POA: Insufficient documentation

## 2015-03-17 DIAGNOSIS — R1110 Vomiting, unspecified: Secondary | ICD-10-CM | POA: Insufficient documentation

## 2015-03-17 DIAGNOSIS — Z91040 Latex allergy status: Secondary | ICD-10-CM | POA: Insufficient documentation

## 2015-03-17 DIAGNOSIS — Z885 Allergy status to narcotic agent status: Secondary | ICD-10-CM | POA: Insufficient documentation

## 2015-03-17 DIAGNOSIS — Z87442 Personal history of urinary calculi: Secondary | ICD-10-CM | POA: Insufficient documentation

## 2015-03-17 DIAGNOSIS — Z881 Allergy status to other antibiotic agents status: Secondary | ICD-10-CM | POA: Insufficient documentation

## 2015-03-17 DIAGNOSIS — R1030 Lower abdominal pain, unspecified: Secondary | ICD-10-CM

## 2015-03-17 DIAGNOSIS — N939 Abnormal uterine and vaginal bleeding, unspecified: Secondary | ICD-10-CM | POA: Insufficient documentation

## 2015-03-17 DIAGNOSIS — Z9109 Other allergy status, other than to drugs and biological substances: Secondary | ICD-10-CM | POA: Insufficient documentation

## 2015-03-17 LAB — URINALYSIS, ROUTINE W REFLEX MICROSCOPIC
BILIRUBIN URINE: NEGATIVE
GLUCOSE, UA: NEGATIVE mg/dL
Hgb urine dipstick: NEGATIVE
KETONES UR: NEGATIVE mg/dL
LEUKOCYTES UA: NEGATIVE
NITRITE: NEGATIVE
PROTEIN: NEGATIVE mg/dL
Specific Gravity, Urine: 1.025 (ref 1.005–1.030)
pH: 6 (ref 5.0–8.0)

## 2015-03-17 LAB — POCT PREGNANCY, URINE: Preg Test, Ur: NEGATIVE

## 2015-03-17 MED ORDER — KETOROLAC TROMETHAMINE 60 MG/2ML IM SOLN
60.0000 mg | Freq: Once | INTRAMUSCULAR | Status: AC
  Administered 2015-03-17: 60 mg via INTRAMUSCULAR
  Filled 2015-03-17: qty 2

## 2015-03-17 NOTE — MAU Note (Addendum)
Lower abd pain x 2 days, thinks it is ovarian pain.  Started bleeding last Tuesday, stopped this a.m.  Vomiting started this morning, denies diarrhea.  Pt states Dr. Ihor Dow told her that her ovaries are shut down & that she is in menopause.

## 2015-03-17 NOTE — MAU Provider Note (Signed)
History     CSN: [id number]  Arrival date and time: 03/17/15 1326   None     Chief Complaint  Patient presents with  . Abdominal Pain  . Emesis   HPI Patricia Rivers is 30 y.o. presents with ? Ovarian pain.  Patient of Dr. Vladimir Creeks.  Patient states she is menopausal--FSH 6.   Has not had period since 2013.  Began with vaginal bleeding  6 days ago,  stopped today.  States pain this am "brought her to knees" rating it 10/10.  Took 2 Ibuprofen tabs at 9am.  Pain now rated as 9/10.  Denies nausea/vomiting.     Past Medical History  Diagnosis Date  . Right ankle injury   . Colon polyp   . Rectal polyp   . Carcinoid tumor of rectum   . 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 GERD (gastroesophageal reflux disease)   . Ovarian failure   . Chronic pelvic pain in female   . Kidney stone   . Chronic diarrhea   . Uterine fibroid   . Headache     Past Surgical History  Procedure Laterality Date  . Colonoscopy with esophagogastroduodenoscopy (egd) N/A 05/16/2013    Dr. Gala Romney: rectal polyp with low grade carcinoid tumor, colon polyp with tubulovillous adenoma with high grade dysplasia. Needs repeat colonoscopy in 3 months. EGD with antral erosions, duodenal bulb erosions, superficial ulceration. path with mild chronic gastritis   . Colonoscopy with propofol N/A 09/06/2013    [id number] ileocolonoscopy. No evidence  of residual polyp anywhere. Next TCS 08/2016.  [name] Cholecystectomy      Family History  Problem Relation Age of Onset  . Colon cancer Maternal Grandmother   . Cervical cancer Mother   . Asthma    . Diabetes      Social History  Substance Use Topics  . Smoking status: Current Every Day Smoker -- 1.00 packs/day for 11 years    Types: Cigarettes  . Smokeless tobacco: None     Comment: Smokes one pack of cigarettes daily  . Alcohol Use: Yes     Comment: occasionally    Allergies:  Allergies  Allergen Reactions  . Codeine Nausea And  Vomiting  . Erythromycin Itching  . Hydromorphone Hcl Nausea And Vomiting  . Nsaids Other (See Comments)    Per MD orders- no reaction  . Penicillins Hives    Has patient had a PCN reaction causing immediate rash, facial/tongue/throat swelling, SOB or lightheadedness with hypotension: No Has patient had a PCN reaction causing severe rash involving mucus membranes or skin necrosis: No Has patient had a PCN reaction that required hospitalization No Has patient had a PCN reaction occurring within the last 10 years: No If all of the above answers are "NO", then may proceed with Cephalosporin use.   [name] Bentyl [Dicyclomine Hcl] Rash    Drowsiness  . Latex Rash    Irritates skin(clear tape)  . Tape Itching and Rash    Clear tape, use paper tape    Prescriptions prior to admission  Medication Sig Dispense Refill Last Dose  . aspirin-acetaminophen-caffeine (EXCEDRIN MIGRAINE) 250-250-65 MG tablet Take 2 tablets by mouth every 6 (six) hours as needed for headache.   03/16/2015 at 1900  . loperamide (IMODIUM A-D) 2 MG tablet Take 1 tablet (2 mg total) by mouth 4 (four) times daily as needed for diarrhea or loose stools. 30 tablet 0 Past Week at Unknown time  .  ondansetron (ZOFRAN ODT) 4 MG disintegrating tablet Take 1 tablet (4 mg total) by mouth every 8 (eight) hours as needed. (Patient taking differently: Take 4 mg by mouth every 8 (eight) hours as needed for nausea. ) 10 tablet 1 03/17/2015 at 1230  . benzonatate (TESSALON) 100 MG capsule Take 2 capsules (200 mg total) by mouth 3 (three) times daily as needed. (Patient not taking: Reported on 03/17/2015) 30 capsule 0   . chlorpheniramine-HYDROcodone (TUSSIONEX PENNKINETIC ER) 10-8 MG/5ML SUER Take 5 mLs by mouth 2 (two) times daily. (Patient not taking: Reported on 03/17/2015) 75 mL 0   . estrogens-methylTEST 1.25-2.5 MG TABS per tablet Take 1 tablet by mouth daily. (Patient not taking: Reported on 01/20/2015) 30 tablet 5   . ondansetron (ZOFRAN) 4 MG  tablet Take 1 tablet (4 mg total) by mouth every 8 (eight) hours as needed for nausea or vomiting. (Patient not taking: Reported on 01/20/2015) 6 tablet 0   . traMADol (ULTRAM) 50 MG tablet Take 1 tablet (50 mg total) by mouth every 6 (six) hours as needed. (Patient not taking: Reported on 01/20/2015) 20 tablet 0     Review of Systems  Constitutional: Negative for fever and chills.  Gastrointestinal: Positive for abdominal pain. Negative for nausea and vomiting.  Genitourinary: Negative for dysuria, urgency, frequency and hematuria.       Period began 6 days ago, bleeding stopped today.  Neurological: Negative for headaches.   Physical Exam   Blood pressure 119/79, pulse 79, temperature 97.7 F (36.5 C), temperature source Oral, resp. rate 18, last menstrual period 03/11/2015.  Physical Exam  Nursing note and vitals reviewed. Constitutional: She is oriented to person, place, and time. She appears well-developed and well-nourished. No distress.  HENT:  Head: Normocephalic.  Neck: Normal range of motion. Neck supple.  Cardiovascular: Normal rate.   Respiratory: Effort normal and breath sounds normal. No respiratory distress.  GI: Soft. There is tenderness (mild bilateral lower pelvic tenderness on exam. ). There is no rebound and no guarding.  Genitourinary: There is no rash, tenderness or lesion on the right labia. There is no rash, tenderness or lesion on the left labia. Uterus is not enlarged and not tender. Cervix exhibits no motion tenderness, no discharge and no friability. Right adnexum displays no mass, no tenderness and no fullness. Left adnexum displays no mass, no tenderness and no fullness. No erythema, tenderness or bleeding in the vagina. No vaginal discharge found.  Musculoskeletal: Normal range of motion. She exhibits no edema.  Neurological: She is alert and oriented to person, place, and time.  Skin: Skin is warm and dry.  Psychiatric: She has a normal mood and affect. Her  behavior is normal. Thought content normal.    Results for orders placed or performed during the hospital encounter of 03/17/15 (from the past 24 hour(s))  Urinalysis, Routine w reflex microscopic (not at [facility])     Status: None   Collection Time: 03/17/15  2:30 PM  Result Value Ref Range   Color, Urine YELLOW YELLOW   APPearance CLEAR CLEAR   Specific Gravity, Urine 1.025 1.005 - 1.030   pH 6.0 5.0 - 8.0   Glucose, UA NEGATIVE NEGATIVE mg/dL   Hgb urine dipstick NEGATIVE NEGATIVE   Bilirubin Urine NEGATIVE NEGATIVE   Ketones, ur NEGATIVE NEGATIVE mg/dL   Protein, ur NEGATIVE NEGATIVE mg/dL   Nitrite NEGATIVE NEGATIVE   Leukocytes, UA NEGATIVE NEGATIVE  Pregnancy, urine POC     Status: None   Collection Time:  03/17/15  2:41 PM  Result Value Ref Range   Preg Test, Ur NEGATIVE NEGATIVE   US Transvaginal Non-ob  03/17/2015  CLINICAL DATA:  Abnormal uterine bleeding. EXAM: TRANSABDOMINAL AND TRANSVAGINAL ULTRASOUND OF PELVIS TECHNIQUE: Both transabdominal and transvaginal ultrasound examinations of the pelvis were performed. Transabdominal technique was performed for global imaging of the pelvis including uterus, ovaries, adnexal regions, and pelvic cul-de-sac. It was necessary to proceed with endovaginal exam following the transabdominal exam to visualize the ovaries. COMPARISON:  Ultrasound of June 25, 2014 FINDINGS: Uterus Measurements: 6.4 x 4.2 x 2.8 cm. 1.6 cm fibroid is noted in the left side of the uterine fundus. Endometrium Thickness: 1.2 mm which is within normal limits. No focal abnormality visualized. Right ovary Measurements: 2.3 x 2.3 x 1.5 cm. Normal appearance/no adnexal mass. Left ovary Measurements: 2.2 x 2.1 x 1.9 cm. Normal appearance/no adnexal mass. Other findings No abnormal free fluid. IMPRESSION: Small uterine fibroid is noted. No other definite abnormality seen in the pelvis. If bleeding remains unresponsive to hormonal or medical therapy, sonohysterogram should be  considered for focal lesion work-up. (Ref: Radiological Reasoning: Algorithmic Workup of Abnormal Vaginal Bleeding with Endovaginal Sonography and Sonohysterography. [id number] [id number]) Electronically Signed   By: Marijo Conception, M.D.   On: 03/17/2015 18:01   US Pelvis Complete  03/17/2015  CLINICAL DATA:  Abnormal uterine bleeding. EXAM: TRANSABDOMINAL AND TRANSVAGINAL ULTRASOUND OF PELVIS TECHNIQUE: Both transabdominal and transvaginal ultrasound examinations of the pelvis were performed. Transabdominal technique was performed for global imaging of the pelvis including uterus, ovaries, adnexal regions, and pelvic cul-de-sac. It was necessary to proceed with endovaginal exam following the transabdominal exam to visualize the ovaries. COMPARISON:  Ultrasound of June 25, 2014 FINDINGS: Uterus Measurements: 6.4 x 4.2 x 2.8 cm. 1.6 cm fibroid is noted in the left side of the uterine fundus. Endometrium Thickness: 1.2 mm which is within normal limits. No focal abnormality visualized. Right ovary Measurements: 2.3 x 2.3 x 1.5 cm. Normal appearance/no adnexal mass. Left ovary Measurements: 2.2 x 2.1 x 1.9 cm. Normal appearance/no adnexal mass. Other findings No abnormal free fluid. IMPRESSION: Small uterine fibroid is noted. No other definite abnormality seen in the pelvis. If bleeding remains unresponsive to hormonal or medical therapy, sonohysterogram should be considered for focal lesion work-up. (Ref: Radiological Reasoning: Algorithmic Workup of Abnormal Vaginal Bleeding with Endovaginal Sonography and Sonohysterography. [id number] [id number]) Electronically Signed   By: Marijo Conception, M.D.   On: 03/17/2015 18:01    MAU Course  Procedures  Discussed use of Toradol.  Patient states she can take NSAIDS, but sometimes makes her stomach hurt.  She denies swelling of throat, itching or hives with use.    MDM MSE Labs Exam IM injection of Toradol in MAU.   Pain response to Toradol, down to  5/10  Assessment and Plan  A:  Pelvic pain      Single uterine fibroid 6.4 X 4.2 X 2.8cm      History of Premature Ovarian Failure  P:  F/U with G[facility] Clinic /Dr. Maylene RoesTamala Julian       May continue to use Advil tomorrow       Patricia Rivers,EVE M 03/17/2015, 6:33 PM

## 2015-03-17 NOTE — Discharge Instructions (Signed)
Abdominal Pain, Adult Many things can cause abdominal pain. Usually, abdominal pain is not caused by a disease and will improve without treatment. It can often be observed and treated at home. Your health care provider will do a physical exam and possibly order blood tests and X-rays to help determine the seriousness of your pain. However, in many cases, more time must pass before a clear cause of the pain can be found. Before that point, your health care provider may not know if you need more testing or further treatment. HOME CARE INSTRUCTIONS Monitor your abdominal pain for any changes. The following actions may help to alleviate any discomfort you are experiencing:  Only take over-the-counter or prescription medicines as directed by your health care provider.  Do not take laxatives unless directed to do so by your health care provider.  Try a clear liquid diet (broth, tea, or water) as directed by your health care provider. Slowly move to a bland diet as tolerated. SEEK MEDICAL CARE IF:  You have unexplained abdominal pain.  You have abdominal pain associated with nausea or diarrhea.  You have pain when you urinate or have a bowel movement.  You experience abdominal pain that wakes you in the night.  You have abdominal pain that is worsened or improved by eating food.  You have abdominal pain that is worsened with eating fatty foods.  You have a fever. SEEK IMMEDIATE MEDICAL CARE IF:  Your pain does not go away within 2 hours.  You keep throwing up (vomiting).  Your pain is felt only in portions of the abdomen, such as the right side or the left lower portion of the abdomen.  You pass bloody or black tarry stools. MAKE SURE YOU:  Understand these instructions.  Will watch your condition.  Will get help right away if you are not doing well or get worse.   This information is not intended to replace advice given to you by your health care provider. Make sure you discuss  any questions you have with your health care provider.   Document Released: 12/09/2004 Document Revised: 11/20/2014 Document Reviewed: 11/08/2012 Elsevier Interactive Patient Education 2016 [employer]  Abnormal Uterine Bleeding Abnormal uterine bleeding can affect women at various stages in life, including teenagers, women in their reproductive years, pregnant women, and women who have reached menopause. Several kinds of uterine bleeding are considered abnormal, including:  Bleeding or spotting between periods.   Bleeding after sexual intercourse.   Bleeding that is heavier or more than normal.   Periods that last longer than usual.  Bleeding after menopause.  Many cases of abnormal uterine bleeding are minor and simple to treat, while others are more serious. Any type of abnormal bleeding should be evaluated by your health care provider. Treatment will depend on the cause of the bleeding. HOME CARE INSTRUCTIONS Monitor your condition for any changes. The following actions may help to alleviate any discomfort you are experiencing:  Avoid the use of tampons and douches as directed by your health care provider.  Change your pads frequently. You should get regular pelvic exams and Pap tests. Keep all follow-up appointments for diagnostic tests as directed by your health care provider.  SEEK MEDICAL CARE IF:   Your bleeding lasts more than 1 week.   You feel dizzy at times.  SEEK IMMEDIATE MEDICAL CARE IF:   You pass out.   You are changing pads every 15 to 30 minutes.   You have abdominal pain.  You have  a fever.   You become sweaty or weak.   You are passing large blood clots from the vagina.   You start to feel nauseous and vomit. MAKE SURE YOU:   Understand these instructions.  Will watch your condition.  Will get help right away if you are not doing well or get worse.   This information is not intended to replace advice given to you by your health  care provider. Make sure you discuss any questions you have with your health care provider.   Document Released: 03/01/2005 Document Revised: 03/06/2013 Document Reviewed: 09/28/2012 Elsevier Interactive Patient Education [employer].

## 2015-03-17 NOTE — Telephone Encounter (Signed)
Pt called nurse line stating she is having some abdominal pain and bleeding. Attempted to call patient left message to call us back.

## 2015-03-20 ENCOUNTER — Encounter (HOSPITAL_COMMUNITY): Payer: Self-pay

## 2015-03-20 ENCOUNTER — Emergency Department (HOSPITAL_COMMUNITY)
Admission: EM | Admit: 2015-03-20 | Discharge: 2015-03-20 | Disposition: A | Discharge status: Home / Self Care | Payer: Self-pay | Attending: Emergency Medicine | Admitting: Emergency Medicine

## 2015-03-20 ENCOUNTER — Emergency Department (HOSPITAL_COMMUNITY): Payer: Self-pay

## 2015-03-20 DIAGNOSIS — Z86010 Personal history of colonic polyps: Secondary | ICD-10-CM | POA: Insufficient documentation

## 2015-03-20 DIAGNOSIS — F17210 Nicotine dependence, cigarettes, uncomplicated: Secondary | ICD-10-CM | POA: Insufficient documentation

## 2015-03-20 DIAGNOSIS — Z91040 Latex allergy status: Secondary | ICD-10-CM | POA: Insufficient documentation

## 2015-03-20 DIAGNOSIS — G8929 Other chronic pain: Secondary | ICD-10-CM | POA: Insufficient documentation

## 2015-03-20 DIAGNOSIS — Z85040 Personal history of malignant carcinoid tumor of rectum: Secondary | ICD-10-CM | POA: Insufficient documentation

## 2015-03-20 DIAGNOSIS — Z8742 Personal history of other diseases of the female genital tract: Secondary | ICD-10-CM | POA: Insufficient documentation

## 2015-03-20 DIAGNOSIS — Z86018 Personal history of other benign neoplasm: Secondary | ICD-10-CM | POA: Insufficient documentation

## 2015-03-20 DIAGNOSIS — K5000 Crohn's disease of small intestine without complications: Secondary | ICD-10-CM | POA: Insufficient documentation

## 2015-03-20 DIAGNOSIS — Z9889 Other specified postprocedural states: Secondary | ICD-10-CM | POA: Insufficient documentation

## 2015-03-20 DIAGNOSIS — Z87828 Personal history of other (healed) physical injury and trauma: Secondary | ICD-10-CM | POA: Insufficient documentation

## 2015-03-20 DIAGNOSIS — Z87442 Personal history of urinary calculi: Secondary | ICD-10-CM | POA: Insufficient documentation

## 2015-03-20 DIAGNOSIS — Z880 Allergy status to penicillin: Secondary | ICD-10-CM | POA: Insufficient documentation

## 2015-03-20 DIAGNOSIS — Z3202 Encounter for pregnancy test, result negative: Secondary | ICD-10-CM | POA: Insufficient documentation

## 2015-03-20 LAB — COMPREHENSIVE METABOLIC PANEL
ALT: 16 U/L (ref 14–54)
AST: 17 U/L (ref 15–41)
Albumin: 3.7 g/dL (ref 3.5–5.0)
Alkaline Phosphatase: 78 U/L (ref 38–126)
Anion gap: 7 (ref 5–15)
BUN: 11 mg/dL (ref 6–20)
CHLORIDE: 108 mmol/L (ref 101–111)
CO2: 23 mmol/L (ref 22–32)
CREATININE: 0.68 mg/dL (ref 0.44–1.00)
Calcium: 8.9 mg/dL (ref 8.9–10.3)
GFR calc Af Amer: >60 mL/min (ref >60)
GFR calc non Af Amer: >60 mL/min (ref >60)
Glucose, Bld: 100 mg/dL — ABNORMAL HIGH (ref 65–99)
Potassium: 4.3 mmol/L (ref 3.5–5.1)
SODIUM: 138 mmol/L (ref 135–145)
Total Bilirubin: 0.7 mg/dL (ref 0.3–1.2)
Total Protein: 6.5 g/dL (ref 6.5–8.1)

## 2015-03-20 LAB — CBC WITH DIFFERENTIAL/PLATELET
BASOS ABS: 0 10*3/uL (ref 0.0–0.1)
Basophils Relative: 0 %
EOS ABS: 0.5 10*3/uL (ref 0.0–0.7)
Eosinophils Relative: 5 %
HCT: 43.9 % (ref 36.0–46.0)
Hemoglobin: 15.1 g/dL — ABNORMAL HIGH (ref 12.0–15.0)
LYMPHS ABS: 2.8 10*3/uL (ref 0.7–4.0)
Lymphocytes Relative: 34 %
MCH: 31.2 pg (ref 26.0–34.0)
MCHC: 34.4 g/dL (ref 30.0–36.0)
MCV: 90.7 fL (ref 78.0–100.0)
MONOS PCT: 5 %
Monocytes Absolute: 0.4 10*3/uL (ref 0.1–1.0)
NEUTROS ABS: 4.6 10*3/uL (ref 1.7–7.7)
NEUTROS PCT: 56 %
PLATELETS: 181 10*3/uL (ref 150–400)
RBC: 4.84 MIL/uL (ref 3.87–5.11)
RDW: 12.2 % (ref 11.5–15.5)
WBC: 8.3 10*3/uL (ref 4.0–10.5)

## 2015-03-20 LAB — PREGNANCY, URINE: Preg Test, Ur: NEGATIVE

## 2015-03-20 LAB — URINALYSIS, ROUTINE W REFLEX MICROSCOPIC
BILIRUBIN URINE: NEGATIVE
Glucose, UA: NEGATIVE mg/dL
HGB URINE DIPSTICK: NEGATIVE
Ketones, ur: NEGATIVE mg/dL
Leukocytes, UA: NEGATIVE
Nitrite: NEGATIVE
PH: 5.5 (ref 5.0–8.0)
Protein, ur: NEGATIVE mg/dL
SPECIFIC GRAVITY, URINE: 1.025 (ref 1.005–1.030)

## 2015-03-20 LAB — LIPASE, BLOOD: Lipase: 24 U/L (ref 11–51)

## 2015-03-20 MED ORDER — METRONIDAZOLE 500 MG PO TABS: 500.0000 mg | ORAL_TABLET | Freq: Two times a day (BID) | ORAL | Status: DC

## 2015-03-20 MED ORDER — ONDANSETRON HCL 4 MG/2ML IJ SOLN
4.0000 mg | Freq: Once | INTRAMUSCULAR | Status: AC
  Administered 2015-03-20: 4 mg via INTRAVENOUS
  Filled 2015-03-20: qty 2

## 2015-03-20 MED ORDER — MORPHINE SULFATE (PF) 4 MG/ML IV SOLN
4.0000 mg | Freq: Once | INTRAVENOUS | Status: AC
  Administered 2015-03-20: 4 mg via INTRAVENOUS
  Filled 2015-03-20: qty 1

## 2015-03-20 MED ORDER — IOHEXOL 300 MG/ML  SOLN
100.0000 mL | Freq: Once | INTRAMUSCULAR | Status: AC | PRN
  Administered 2015-03-20: 100 mL via INTRAVENOUS

## 2015-03-20 MED ORDER — SODIUM CHLORIDE 0.9 % IV BOLUS (SEPSIS)
1000.0000 mL | Freq: Once | INTRAVENOUS | Status: AC
  Administered 2015-03-20: 1000 mL via INTRAVENOUS

## 2015-03-20 MED ORDER — ONDANSETRON HCL 4 MG PO TABS: 4.0000 mg | ORAL_TABLET | Freq: Four times a day (QID) | ORAL | Status: DC

## 2015-03-20 MED ORDER — CIPROFLOXACIN HCL 500 MG PO TABS: 500.0000 mg | ORAL_TABLET | Freq: Two times a day (BID) | ORAL | Status: DC

## 2015-03-20 NOTE — ED Provider Notes (Signed)
CSN: [id number]     Arrival date & time 03/20/15  1149 History  By signing my name below, I, Eustaquio Maize, attest that this documentation has been prepared under the direction and in the presence of Ezequiel Essex, MD. Electronically Signed: Eustaquio Maize, ED Scribe. 03/20/2015. 12:39 PM.   Chief Complaint  Patient presents with  . Emesis   The history is provided by the patient. No language interpreter was used.     HPI Comments: Patricia Rivers is a 30 y.o. female who presents to the Emergency Department complaining of gradual onset, constant, RLQ abdominal pain x a few days. Pt reports being seen at [facility] on Monday, 03/17/2015 (3 days ago) for her pain as well as vaginal bleeding. The vaginal bleeding stopped on its prior to being seen at [facility]. She had an Ultrasound and pelvic exam done at that time with findings of fibroids and was discharged home. Pt mentions that after being discharged from [facility] she began having nausea, vomiting, and diarrhea with too many episodes of vomiting and diarrhea to count. She has taken Peptobismal and Imodium without relief. Denies hematochezia, hematemesis, fever, flank pain, dysuria, sore throat, headache, vaginal bleeding, or any other associated symptoms. PSHx cholecystectomy. No recent sick contact. No recent foreign travel or antibiotic use.   Past Medical History  Diagnosis Date  . Right ankle injury   . Colon polyp   . Rectal polyp   . Carcinoid tumor of rectum   . 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 GERD (gastroesophageal reflux disease)   . Ovarian failure   . Chronic pelvic pain in female   . Kidney stone   . Chronic diarrhea   . Uterine fibroid   . Headache    Past Surgical History  Procedure Laterality Date  . Colonoscopy with esophagogastroduodenoscopy (egd) N/A 05/16/2013    Dr. Gala Romney: rectal polyp with low grade carcinoid tumor, colon polyp with tubulovillous adenoma with high grade dysplasia.  Needs repeat colonoscopy in 3 months. EGD with antral erosions, duodenal bulb erosions, superficial ulceration. path with mild chronic gastritis   . Colonoscopy with propofol N/A 09/06/2013    [id number] ileocolonoscopy. No evidence  of residual polyp anywhere. Next TCS 08/2016.  [name] Cholecystectomy     Family History  Problem Relation Age of Onset  . Colon cancer Maternal Grandmother   . Cervical cancer Mother   . Asthma    . Diabetes     Social History  Substance Use Topics  . Smoking status: Current Every Day Smoker -- 1.00 packs/day for 11 years    Types: Cigarettes  . Smokeless tobacco: None     Comment: Smokes one pack of cigarettes daily  . Alcohol Use: Yes     Comment: occasionally   OB History    No data available     Review of Systems  A complete 10 system review of systems was obtained and all systems are negative except as noted in the HPI and PMH.   Allergies  Codeine; Erythromycin; Hydromorphone hcl; Nsaids; Penicillins; Bentyl; Latex; and Tape  Home Medications   Prior to Admission medications   Medication Sig Start Date End Date Taking? Authorizing Provider  aspirin-acetaminophen-caffeine (EXCEDRIN MIGRAINE) [phone] MG tablet Take 2 tablets by mouth every 6 (six) hours as needed for headache.   Yes Historical Provider, MD  bismuth subsalicylate (PEPTO BISMOL) 262 MG/15ML suspension Take 15 mLs by mouth 3 (three) times daily as needed for  diarrhea or loose stools.   Yes Historical Provider, MD  loperamide (IMODIUM A-D) 2 MG tablet Take 1 tablet (2 mg total) by mouth 4 (four) times daily as needed for diarrhea or loose stools. 01/20/15  Yes Fredia Sorrow, MD  ondansetron (ZOFRAN ODT) 4 MG disintegrating tablet Take 1 tablet (4 mg total) by mouth every 8 (eight) hours as needed. Patient taking differently: Take 4 mg by mouth every 8 (eight) hours as needed for nausea.  01/20/15  Yes Fredia Sorrow, MD  ciprofloxacin (CIPRO) 500 MG tablet Take 1 tablet (500 mg  total) by mouth 2 (two) times daily. 03/20/15   Ezequiel Essex, MD  metroNIDAZOLE (FLAGYL) 500 MG tablet Take 1 tablet (500 mg total) by mouth 2 (two) times daily. 03/20/15   Ezequiel Essex, MD  ondansetron (ZOFRAN) 4 MG tablet Take 1 tablet (4 mg total) by mouth every 6 (six) hours. 03/20/15   Ezequiel Essex, MD  traMADol (ULTRAM) 50 MG tablet Take 1 tablet (50 mg total) by mouth every 6 (six) hours as needed. Patient not taking: Reported on 01/20/2015 12/04/14   [name], PA-C   Triage Vitals:  BP 117/88 mmHg  Pulse 82  Temp(Src) 97.7 F (36.5 C) (Oral)  Resp 20  Ht 5\' 7"  (1.702 m)  Wt 216 lb (97.977 kg)  BMI 33.82 kg/m2  SpO2 100%  LMP 03/11/2015   Physical Exam  Constitutional: She is oriented to person, place, and time. She appears well-developed and well-nourished. No distress.  HENT:  Head: Normocephalic and atraumatic.  Mouth/Throat: Oropharynx is clear and moist. No oropharyngeal exudate.  Eyes: Conjunctivae and EOM are normal. Pupils are equal, round, and reactive to light.  Neck: Normal range of motion. Neck supple.  No meningismus.  Cardiovascular: Normal rate, regular rhythm, normal heart sounds and intact distal pulses.   No murmur heard. Pulmonary/Chest: Effort normal and breath sounds normal. No respiratory distress.  Abdominal: Soft. There is tenderness. There is guarding. There is no rebound and no CVA tenderness.  Diffusely tender, worse in the RLQ with voluntary guarding  Musculoskeletal: Normal range of motion. She exhibits no edema or tenderness.  Neurological: She is alert and oriented to person, place, and time. No cranial nerve deficit. She exhibits normal muscle tone. Coordination normal.  No ataxia on finger to nose bilaterally. No pronator drift. 5/5 strength throughout. CN 2-12 intact.Equal grip strength. Sensation intact.   Skin: Skin is warm.  Psychiatric: She has a normal mood and affect. Her behavior is normal.  Nursing note and vitals  reviewed.   ED Course  Procedures (including critical care time)  DIAGNOSTIC STUDIES: Oxygen Saturation is 100% on RA, normal by my interpretation.    COORDINATION OF CARE: 12:39 PM-Discussed treatment plan which includes CT A/P with pt at bedside and pt agreed to plan.   Labs Review Labs Reviewed  CBC WITH DIFFERENTIAL/PLATELET - Abnormal; Notable for the following:    Hemoglobin 15.1 (*)    All other components within normal limits  COMPREHENSIVE METABOLIC PANEL - Abnormal; Notable for the following:    Glucose, Bld 100 (*)    All other components within normal limits  URINALYSIS, ROUTINE W REFLEX MICROSCOPIC (NOT AT [facility])  PREGNANCY, URINE  LIPASE, BLOOD    Imaging Review Ct Abdomen Pelvis W Contrast  03/20/2015  CLINICAL DATA:  30 year old presenting with right lower quadrant abdominal pain associated with nausea, vomiting and diarrhea over the past several days. Surgical history includes cholecystectomy. Personal history of tubulovillous adenoma of the  colon and carcinoid tumor of the rectum. EXAM: CT ABDOMEN AND PELVIS WITH CONTRAST TECHNIQUE: Multidetector CT imaging of the abdomen and pelvis was performed using the standard protocol following bolus administration of intravenous contrast. CONTRAST:  172mL OMNIPAQUE IOHEXOL 300 MG/ML IV. Oral contrast was also administered. COMPARISON:  Six prior CT abdomen and pelvis examinations dating back to 12/16/2005, most recently 11/12/2014. FINDINGS: Lower chest:  Heart size normal.  Visualized lung bases clear. Hepatobiliary: Liver normal in size and appearance. Gallbladder surgically absent. No biliary dilation. Pancreas: Normal in appearance without evidence of mass, ductal dilation, or inflammation. Spleen: Normal in size and appearance. Adrenals/Urinary Tract: Normal appearing adrenal glands. Kidneys normal in size and appearance without focal parenchymal abnormality. No evidence of urinary tract calculi or obstruction. Normal-appearing  urinary bladder. Stomach/Bowel: Stomach normal in appearance for the degree of distention. Normal-appearing small bowel. Mild thickening of the wall of the terminal ileum with enhancement of the mucosa. Remainder the small bowel normal in appearance. Entire colon decompressed and unremarkable. Normal appendix in the right mid abdomen. Vascular/Lymphatic: No visible aortoiliofemoral atherosclerosis. Widely patent visceral arteries. Mildly enlarged lymph nodes in the mesentery adjacent to the cecum, more prominent than on prior examinations. No pathologic lymphadenopathy elsewhere. Reproductive: Uterine fibroid identified on prior ultrasound not visible on the current CT. Normal-appearing ovaries. No adnexal masses. Other: No ascites. Musculoskeletal: Regional skeleton intact without acute or significant osseous abnormality. IMPRESSION: 1. Mild terminal ileitis. Reactive lymph nodes in the mesentery adjacent to the terminal ileum and cecum. 2. No acute or significant abnormalities otherwise. Electronically Signed   By: Evangeline Dakin M.D.   On: 03/20/2015 14:36   I have personally reviewed and evaluated these images and lab results as part of my medical decision-making.   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 Seen at [facility] ago and told she had fibroids on ultrasound. She was not having diarrhea and vomiting at that time.  Labs appear to be at baseline. UA negative. HCG negative.  CT shows normal appendix. There is terminal ileitis with reactive lymph nodes. Patient denies any history of IBD but states her uncle does have Crohn's disease.  We'll trial course of antibiotics and have follow-up with GI. Discussed with patient that this could indicate she has Crohn's disease but does not confirm it. Return precautions discussed.  I personally performed the  services described in this documentation, which was scribed in my presence. The recorded information has been reviewed and is accurate.     Ezequiel Essex, MD 03/20/15 2043

## 2015-03-20 NOTE — ED Notes (Signed)
Pt made aware to return if symptoms worsen or if any life threatening symptoms occur.   

## 2015-03-20 NOTE — Discharge Instructions (Signed)
Colitis As we discussed, it is possible you might have Crohn's disease. Follow up with the GI doctor. Return to the ED if you develop new or worsening symptoms. Colitis is inflammation of the colon. Colitis may last a short time (acute) or it may last a long time (chronic). CAUSES This condition may be caused by:  Viruses.  Bacteria.  Reactions to medicine.  Certain autoimmune diseases, such as Crohn disease or ulcerative colitis. SYMPTOMS Symptoms of this condition include:  Diarrhea.  Passing bloody or tarry stool.  Pain.  Fever.  Vomiting.  Tiredness (fatigue).  Weight loss.  Bloating.  Sudden increase in abdominal pain.  Having fewer bowel movements than usual. DIAGNOSIS This condition is diagnosed with a stool test or a blood test. You may also have other tests, including X-rays, a CT scan, or a colonoscopy. TREATMENT Treatment may include:  Resting the bowel. This involves not eating or drinking for a period of time.  Fluids that are given through an IV tube.  Medicine for pain and diarrhea.  Antibiotic medicines.  Cortisone medicines.  Surgery. HOME CARE INSTRUCTIONS Eating and Drinking  Follow instructions from your health care provider about eating or drinking restrictions.  Drink enough fluid to keep your urine clear or pale yellow.  Work with a dietitian to determine which foods cause your condition to flare up.  Avoid foods that cause flare-ups.  Eat a well-balanced diet. Medicines  Take over-the-counter and prescription medicines only as told by your health care provider.  If you were prescribed an antibiotic medicine, take it as told by your health care provider. Do not stop taking the antibiotic even if you start to feel better. General Instructions  Keep all follow-up visits as told by your health care provider. This is important. SEEK MEDICAL CARE IF:  Your symptoms do not go away.  You develop new symptoms. SEEK IMMEDIATE  MEDICAL CARE IF:  You have a fever that does not go away with treatment.  You develop chills.  You have extreme weakness, fainting, or dehydration.  You have repeated vomiting.  You develop severe pain in your abdomen.  You pass bloody or tarry stool.   This information is not intended to replace advice given to you by your health care provider. Make sure you discuss any questions you have with your health care provider.   Document Released: 04/08/2004 Document Revised: 11/20/2014 Document Reviewed: 06/24/2014 Elsevier Interactive Patient Education [employer].

## 2015-03-20 NOTE — ED Notes (Signed)
Pt reports abd pain Monday and went to [facility].  Reports was told she had uterine fibroids.  Pt says later that day she started having v/d.

## 2015-04-04 ENCOUNTER — Ambulatory Visit (INDEPENDENT_AMBULATORY_CARE_PROVIDER_SITE_OTHER): Payer: Self-pay | Admitting: Obstetrics & Gynecology

## 2015-04-04 ENCOUNTER — Encounter: Payer: Self-pay | Admitting: Obstetrics & Gynecology

## 2015-04-04 VITALS — BP 118/94 | HR 94 | Temp 97.7°F | Resp 18 | Ht 68.0 in | Wt 227.2 lb

## 2015-04-04 DIAGNOSIS — R300 Dysuria: Secondary | ICD-10-CM

## 2015-04-04 DIAGNOSIS — N390 Urinary tract infection, site not specified: Secondary | ICD-10-CM

## 2015-04-04 DIAGNOSIS — R102 Pelvic and perineal pain: Secondary | ICD-10-CM

## 2015-04-04 LAB — POCT URINALYSIS DIP (DEVICE)
GLUCOSE, UA: NEGATIVE mg/dL
Hgb urine dipstick: NEGATIVE
Ketones, ur: NEGATIVE mg/dL
Nitrite: NEGATIVE
PROTEIN: 30 mg/dL — AB
Specific Gravity, Urine: 1.025 (ref 1.005–1.030)
UROBILINOGEN UA: 1 mg/dL (ref 0.0–1.0)
pH: 6 (ref 5.0–8.0)

## 2015-04-04 MED ORDER — CEPHALEXIN 500 MG PO CAPS: 500.0000 mg | ORAL_CAPSULE | Freq: Four times a day (QID) | ORAL | Status: DC

## 2015-04-04 MED ORDER — PHENAZOPYRIDINE HCL 200 MG PO TABS: 200.0000 mg | ORAL_TABLET | Freq: Three times a day (TID) | ORAL | Status: DC | PRN

## 2015-04-04 NOTE — Patient Instructions (Signed)

## 2015-04-04 NOTE — Progress Notes (Signed)
Patient ID: Patricia Rivers, female   DOB: Nov 13, 1985, 30 y.o.   MRN: [id number] Pt reports cipro gives her a migraine and makes her very moody "like she wants to strangle someone"  Pt also repots severe pain with urination, to the point it makes her want to cry.

## 2015-04-04 NOTE — Progress Notes (Signed)
Patient ID: Patricia Rivers, female   DOB: [birth year], 30 y.o.   MRN: [id number] History:  30 y.o. No obstetric history on file. here today for eval of lower abd pain and dysuria.  She was seen in the ED earlier this month.  She was treated with Cipro and flagyl but, her sx persist.  She c/o worse pain and burning after urination. She reports that a few weeks ago she had genital bleeding but, does not know if it came from her bladder or vagina.  She has premature menopause.     The following portions of the patient's history were reviewed and updated as appropriate: allergies, current medications, past family history, past medical history, past social history, past surgical history and problem list.  Review of Systems:  Pertinent items are noted in HPI.  Objective:  Physical Exam Blood pressure 118/94, pulse 94, temperature 97.7 F (36.5 C), temperature source Oral, resp. rate 18, height 5\' 8"  (1.727 m), weight 227 lb 3.2 oz (103.057 kg), last menstrual period 03/11/2015, SpO2 100 %. Gen: NAD Abd: Soft, nondistended; lower tenderness to palpation. No rebound or guarding. Pelvic: Normal appearing external genitalia; normal appearing vaginal mucosa and cervix.  Normal discharge.  Small uterus, no other palpable masses, no uterine or adnexal tenderness.  There is tenderness when palpating behind the bladder.  UA: small leuk   Labs and Imaging US Transvaginal Non-ob  03/17/2015  CLINICAL DATA:  Abnormal uterine bleeding. EXAM: TRANSABDOMINAL AND TRANSVAGINAL ULTRASOUND OF PELVIS TECHNIQUE: Both transabdominal and transvaginal ultrasound examinations of the pelvis were performed. Transabdominal technique was performed for global imaging of the pelvis including uterus, ovaries, adnexal regions, and pelvic cul-de-sac. It was necessary to proceed with endovaginal exam following the transabdominal exam to visualize the ovaries. COMPARISON:  Ultrasound of June 25, 2014 FINDINGS: Uterus Measurements: 6.4 x  4.2 x 2.8 cm. 1.6 cm fibroid is noted in the left side of the uterine fundus. Endometrium Thickness: 1.2 mm which is within normal limits. No focal abnormality visualized. Right ovary Measurements: 2.3 x 2.3 x 1.5 cm. Normal appearance/no adnexal mass. Left ovary Measurements: 2.2 x 2.1 x 1.9 cm. Normal appearance/no adnexal mass. Other findings No abnormal free fluid. IMPRESSION: Small uterine fibroid is noted. No other definite abnormality seen in the pelvis. If bleeding remains unresponsive to hormonal or medical therapy, sonohysterogram should be considered for focal lesion work-up. (Ref: Radiological Reasoning: Algorithmic Workup of Abnormal Vaginal Bleeding with Endovaginal Sonography and Sonohysterography. AJR [id number]) Electronically Signed   By: Marijo Conception, M.D.   On: 03/17/2015 18:01   US Pelvis Complete  03/17/2015  CLINICAL DATA:  Abnormal uterine bleeding. EXAM: TRANSABDOMINAL AND TRANSVAGINAL ULTRASOUND OF PELVIS TECHNIQUE: Both transabdominal and transvaginal ultrasound examinations of the pelvis were performed. Transabdominal technique was performed for global imaging of the pelvis including uterus, ovaries, adnexal regions, and pelvic cul-de-sac. It was necessary to proceed with endovaginal exam following the transabdominal exam to visualize the ovaries. COMPARISON:  Ultrasound of June 25, 2014 FINDINGS: Uterus Measurements: 6.4 x 4.2 x 2.8 cm. 1.6 cm fibroid is noted in the left side of the uterine fundus. Endometrium Thickness: 1.2 mm which is within normal limits. No focal abnormality visualized. Right ovary Measurements: 2.3 x 2.3 x 1.5 cm. Normal appearance/no adnexal mass. Left ovary Measurements: 2.2 x 2.1 x 1.9 cm. Normal appearance/no adnexal mass. Other findings No abnormal free fluid. IMPRESSION: Small uterine fibroid is noted. No other definite abnormality seen in the pelvis. If bleeding remains unresponsive to  hormonal or medical therapy, sonohysterogram should be  considered for focal lesion work-up. (Ref: Radiological Reasoning: Algorithmic Workup of Abnormal Vaginal Bleeding with Endovaginal Sonography and Sonohysterography. AJR [id number]) Electronically Signed   By: Marijo Conception, M.D.   On: 03/17/2015 18:01   Ct Abdomen Pelvis W Contrast  03/20/2015  CLINICAL DATA:  30 year old presenting with right lower quadrant abdominal pain associated with nausea, vomiting and diarrhea over the past several days. Surgical history includes cholecystectomy. 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 EXAM: CT ABDOMEN AND PELVIS WITH CONTRAST TECHNIQUE: Multidetector CT imaging of the abdomen and pelvis was performed using the standard protocol following bolus administration of intravenous contrast. CONTRAST:  122mL OMNIPAQUE IOHEXOL 300 MG/ML IV. Oral contrast was also administered. COMPARISON:  Six prior CT abdomen and pelvis examinations dating back to 12/16/2005, most recently 11/12/2014. FINDINGS: Lower chest:  Heart size normal.  Visualized lung bases clear. Hepatobiliary: Liver normal in size and appearance. Gallbladder surgically absent. No biliary dilation. Pancreas: Normal in appearance without evidence of mass, ductal dilation, or inflammation. Spleen: Normal in size and appearance. Adrenals/Urinary Tract: Normal appearing adrenal glands. Kidneys normal in size and appearance without focal parenchymal abnormality. No evidence of urinary tract calculi or obstruction. Normal-appearing urinary bladder. Stomach/Bowel: Stomach normal in appearance for the degree of distention. Normal-appearing small bowel. Mild thickening of the wall of the terminal ileum with enhancement of the mucosa. Remainder the small bowel normal in appearance. Entire colon decompressed and unremarkable. Normal appendix in the right mid abdomen. Vascular/Lymphatic: No visible aortoiliofemoral atherosclerosis. Widely patent visceral arteries. Mildly  enlarged lymph nodes in the mesentery adjacent to the cecum, more prominent than on prior examinations. No pathologic lymphadenopathy elsewhere. Reproductive: Uterine fibroid identified on prior ultrasound not visible on the current CT. Normal-appearing ovaries. No adnexal masses. Other: No ascites. Musculoskeletal: Regional skeleton intact without acute or significant osseous abnormality. IMPRESSION: 1. Mild terminal ileitis. Reactive lymph nodes in the mesentery adjacent to the terminal ileum and cecum. 2. No acute or significant abnormalities otherwise. Electronically Signed   By: Evangeline Dakin M.D.   On: 03/20/2015 14:36    Assessment & Plan:  Pelvic pain suspect due to UTI possible with resistance to Cipro  Keflex 500mg  qid x 7 Pyridium 200mg  po tid prn Urine cx Note: pt with h/o rash on PCN as a child. NO hives. NO SOB.  recviewed possibl e10% cross reactivity with Keflex.  Pt to stop meds and call if rash develops.    Dany Walther L. Harraway-Smith, M.D., Cherlynn June

## 2015-04-07 ENCOUNTER — Emergency Department (HOSPITAL_COMMUNITY): Payer: Self-pay

## 2015-04-07 ENCOUNTER — Encounter (HOSPITAL_COMMUNITY): Payer: Self-pay | Admitting: *Deleted

## 2015-04-07 ENCOUNTER — Emergency Department (HOSPITAL_COMMUNITY)
Admission: EM | Admit: 2015-04-07 | Discharge: 2015-04-07 | Disposition: A | Discharge status: Home / Self Care | Payer: Self-pay | Attending: Emergency Medicine | Admitting: Emergency Medicine

## 2015-04-07 DIAGNOSIS — Z8742 Personal history of other diseases of the female genital tract: Secondary | ICD-10-CM | POA: Insufficient documentation

## 2015-04-07 DIAGNOSIS — Y998 Other external cause status: Secondary | ICD-10-CM | POA: Insufficient documentation

## 2015-04-07 DIAGNOSIS — F17210 Nicotine dependence, cigarettes, uncomplicated: Secondary | ICD-10-CM | POA: Insufficient documentation

## 2015-04-07 DIAGNOSIS — Z8719 Personal history of other diseases of the digestive system: Secondary | ICD-10-CM | POA: Insufficient documentation

## 2015-04-07 DIAGNOSIS — Y9389 Activity, other specified: Secondary | ICD-10-CM | POA: Insufficient documentation

## 2015-04-07 DIAGNOSIS — Z792 Long term (current) use of antibiotics: Secondary | ICD-10-CM | POA: Insufficient documentation

## 2015-04-07 DIAGNOSIS — Z86010 Personal history of colonic polyps: Secondary | ICD-10-CM | POA: Insufficient documentation

## 2015-04-07 DIAGNOSIS — Z880 Allergy status to penicillin: Secondary | ICD-10-CM | POA: Insufficient documentation

## 2015-04-07 DIAGNOSIS — Z86018 Personal history of other benign neoplasm: Secondary | ICD-10-CM | POA: Insufficient documentation

## 2015-04-07 DIAGNOSIS — Z91040 Latex allergy status: Secondary | ICD-10-CM | POA: Insufficient documentation

## 2015-04-07 DIAGNOSIS — Y9289 Other specified places as the place of occurrence of the external cause: Secondary | ICD-10-CM | POA: Insufficient documentation

## 2015-04-07 DIAGNOSIS — S3210XA Unspecified fracture of sacrum, initial encounter for closed fracture: Secondary | ICD-10-CM | POA: Insufficient documentation

## 2015-04-07 DIAGNOSIS — W010XXA Fall on same level from slipping, tripping and stumbling without subsequent striking against object, initial encounter: Secondary | ICD-10-CM | POA: Insufficient documentation

## 2015-04-07 DIAGNOSIS — Z87442 Personal history of urinary calculi: Secondary | ICD-10-CM | POA: Insufficient documentation

## 2015-04-07 DIAGNOSIS — S39012A Strain of muscle, fascia and tendon of lower back, initial encounter: Secondary | ICD-10-CM | POA: Insufficient documentation

## 2015-04-07 DIAGNOSIS — G8929 Other chronic pain: Secondary | ICD-10-CM | POA: Insufficient documentation

## 2015-04-07 DIAGNOSIS — S322XXA Fracture of coccyx, initial encounter for closed fracture: Secondary | ICD-10-CM | POA: Insufficient documentation

## 2015-04-07 MED ORDER — HYDROCODONE-ACETAMINOPHEN 5-325 MG PO TABS
1.0000 | ORAL_TABLET | ORAL | Status: DC | PRN
Start: 2015-04-07 — End: 2015-04-29

## 2015-04-07 MED ORDER — DIAZEPAM 5 MG PO TABS
10.0000 mg | ORAL_TABLET | Freq: Once | ORAL | Status: AC
  Administered 2015-04-07: 10 mg via ORAL
  Filled 2015-04-07: qty 2

## 2015-04-07 MED ORDER — ONDANSETRON HCL 4 MG PO TABS
4.0000 mg | ORAL_TABLET | Freq: Once | ORAL | Status: AC
  Administered 2015-04-07: 4 mg via ORAL
  Filled 2015-04-07: qty 1

## 2015-04-07 MED ORDER — PROMETHAZINE HCL 25 MG PO TABS: 25.0000 mg | ORAL_TABLET | Freq: Four times a day (QID) | ORAL | Status: DC | PRN

## 2015-04-07 MED ORDER — METHOCARBAMOL 500 MG PO TABS: 500.0000 mg | ORAL_TABLET | Freq: Three times a day (TID) | ORAL | Status: DC

## 2015-04-07 MED ORDER — HYDROCODONE-ACETAMINOPHEN 5-325 MG PO TABS
2.0000 | ORAL_TABLET | Freq: Once | ORAL | Status: AC
  Administered 2015-04-07: 2 via ORAL
  Filled 2015-04-07: qty 2

## 2015-04-07 NOTE — ED Provider Notes (Signed)
CSN: [id number]     Arrival date & time 04/07/15  1412 History   By signing my name below, I, Patricia Rivers, attest that this documentation has been prepared under the direction and in the presence of non-physician practitioner, Lily Kocher, PA-C. Electronically Signed: Dora Sims, Scribe. 04/07/2015. 2:48 PM.    Chief Complaint  Patient presents with  . Back Pain   Patient is a 30 y.o. female presenting with back pain. The history is provided by the patient. No language interpreter was used.  Back Pain Location:  Lumbar spine Quality:  Unable to specify Radiates to:  Does not radiate Pain severity:  Severe Pain is:  Same all the time Onset quality:  Sudden Duration: PTA. Timing:  Constant Progression:  Unchanged Chronicity:  New Context: falling   Relieved by:  None tried Worsened by:  Standing Ineffective treatments:  None tried Associated symptoms: no bladder incontinence, no bowel incontinence, no numbness and no weakness      HPI Comments: Patricia Rivers is a 30 y.o. female who presents to the Emergency Department complaining of severe, sharp, sudden onset lower back pain s/p falling outside of her home earlier today. Pt tripped over her dog and landed on her back against steps. She denies any head injury, LOC or neck injury.  Pt notes she was not ambulatory immediately after the accident but states has ambulated since. She notes that her pain is exacerbated when she stands straight up. Pt has not had any previous medical procedures involving her lower back. Pt denies any anticoagulant use or any other medications. She denies urinary or bladder incontinence. Pt denies any other associated symptoms at this time.   Past Medical History  Diagnosis Date  . Right ankle injury   . Colon polyp   . Rectal polyp   . Carcinoid tumor of rectum   . 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 GERD (gastroesophageal reflux disease)   . Ovarian failure   .  Chronic pelvic pain in female   . Kidney stone   . Chronic diarrhea   . Uterine fibroid   . Headache    Past Surgical History  Procedure Laterality Date  . Colonoscopy with esophagogastroduodenoscopy (egd) N/A 05/16/2013    Dr. Gala Romney: rectal polyp with low grade carcinoid tumor, colon polyp with tubulovillous adenoma with high grade dysplasia. Needs repeat colonoscopy in 3 months. EGD with antral erosions, duodenal bulb erosions, superficial ulceration. path with mild chronic gastritis   . Colonoscopy with propofol N/A 09/06/2013    [id number] ileocolonoscopy. No evidence  of residual polyp anywhere. Next TCS 08/2016.  [name] Cholecystectomy     Family History  Problem Relation Age of Onset  . Colon cancer Maternal Grandmother   . Cervical cancer Mother   . Asthma    . Diabetes     Social History  Substance Use Topics  . Smoking status: Current Every Day Smoker -- 1.00 packs/day for 11 years    Types: Cigarettes  . Smokeless tobacco: None     Comment: Smokes one pack of cigarettes daily  . Alcohol Use: Yes     Comment: occasionally   OB History    No data available     Review of Systems  Gastrointestinal: Negative for bowel incontinence.  Genitourinary: Negative for bladder incontinence.  Musculoskeletal: Positive for back pain.  Neurological: Negative for weakness and numbness.  All other systems reviewed and are negative.     Allergies  Codeine; Erythromycin; Hydromorphone hcl; Nsaids; Penicillins; Bentyl; Latex; and Tape  Home Medications   Prior to Admission medications   Medication Sig Start Date End Date Taking? Authorizing Provider  aspirin-acetaminophen-caffeine (EXCEDRIN MIGRAINE) [phone] MG tablet Take 2 tablets by mouth every 6 (six) hours as needed for headache.   Yes Historical Provider, MD  cephALEXin (KEFLEX) 500 MG capsule Take 1 capsule (500 mg total) by mouth 4 (four) times daily. 04/04/15  Yes Lavonia Drafts, MD  ondansetron (ZOFRAN) 4 MG  tablet Take 1 tablet (4 mg total) by mouth every 6 (six) hours. 03/20/15  Yes Ezequiel Essex, MD  phenazopyridine (PYRIDIUM) 200 MG tablet Take 1 tablet (200 mg total) by mouth 3 (three) times daily as needed for pain (urethral spasm). 04/04/15  Yes Lavonia Drafts, MD  ciprofloxacin (CIPRO) 500 MG tablet Take 1 tablet (500 mg total) by mouth 2 (two) times daily. Patient not taking: Reported on 04/07/2015 03/20/15   Ezequiel Essex, MD  metroNIDAZOLE (FLAGYL) 500 MG tablet Take 1 tablet (500 mg total) by mouth 2 (two) times daily. Patient not taking: Reported on 04/07/2015 03/20/15   Ezequiel Essex, MD  traMADol (ULTRAM) 50 MG tablet Take 1 tablet (50 mg total) by mouth every 6 (six) hours as needed. Patient not taking: Reported on 04/07/2015 12/04/14   [name], PA-C   BP 126/79 mmHg  Pulse 93  Temp(Src) 97.5 F (36.4 C) (Oral)  Resp 18  SpO2 97%  LMP 03/11/2015 Physical Exam  Constitutional: She is oriented to person, place, and time. She appears well-developed and well-nourished. No distress.  HENT:  Head: Normocephalic and atraumatic.  Eyes: Conjunctivae and EOM are normal.  Neck: Neck supple. No tracheal deviation present.  Cardiovascular: Normal rate, regular rhythm and normal heart sounds.   Pulmonary/Chest: Effort normal and breath sounds normal. No respiratory distress.  Musculoskeletal: Normal range of motion.  Tenderness to R and L paraspinal lumbar region No palpable step off of lumbar spine No step off of cervical spine   Neurological: She is alert and oriented to person, place, and time.  No motor or sensory deficits of lower extremities Grip strengths equal bilaterally  Skin: Skin is warm and dry.  No bruising to buttocks area  Psychiatric: She has a normal mood and affect. Her behavior is normal.  Nursing note and vitals reviewed.   ED Course  Procedures (including critical care time)  DIAGNOSTIC STUDIES: Oxygen Saturation is 97% on RA, normal by my  interpretation.    COORDINATION OF CARE:  2:49 PM Will order doughnut pillow, anti-inflammatories, narcotic pain medication. Will order X-ray for lumbar spine and will order Valium, Vicodin, and Zofran while in ER. Discussed treatment plan with pt at bedside and pt agreed to plan.   Labs Review Labs Reviewed - No data to display  Imaging Review Dg Lumbar Spine Complete  04/07/2015  CLINICAL DATA:  Pain following fall EXAM: LUMBAR SPINE - COMPLETE 4+ VIEW COMPARISON:  October 19, 2013 FINDINGS: Frontal, lateral, spot lumbosacral lateral, and bilateral oblique views were obtained. There are 5 non-rib-bearing lumbar type vertebral bodies. There is no fracture or spondylolisthesis. Disc spaces appear unremarkable. There is no appreciable facet arthropathy. IMPRESSION: No fracture or spondylolisthesis. No appreciable arthropathic change. Electronically Signed   By: Lowella Grip III M.D.   On: 04/07/2015 15:50   Dg Sacrum/coccyx  04/07/2015  CLINICAL DATA:  Severe sharp lower back pain after falling down steps outside home earlier today. EXAM: SACRUM AND COCCYX - 2+ VIEW COMPARISON:  None. FINDINGS: There it his questionable cortical disruption along the anterior aspect of the upper coccyx, possibly nondisplaced fracture. No additional suspicious bony abnormality. SI joints are symmetric and unremarkable. IMPRESSION: Questionable cortical disruption/fracture along the anterior upper coccyx. Electronically Signed   By: Rolm Baptise M.D.   On: 04/07/2015 15:50   I have personally reviewed and evaluated these images and lab results as part of my medical decision-making.   EKG Interpretation None      MDM  Patient tripped over her dog and fell down some steps. She complains of lower back area pain. X-ray of the lumbar spine is negative for fracture or dislocation. X-ray of the sacrum and coccyx revealed a fracture of the upper coccyx area.  Discuss use of a doughnut-type pillow. Ice pack provided  for tonight. Prescription for Robaxin, Norco, and promethazine given to the patient. Patient will follow with her primary physician.    Final diagnoses:  None    **I have reviewed nursing notes, vital signs, and all appropriate lab and imaging results for this patient.* **I personally performed the services described in this documentation, which was scribed in my presence. The recorded information has been reviewed and is accurate.Lily Kocher, PA-C 04/08/15 1857  Milton Ferguson, MD 04/09/15 [phone]

## 2015-04-07 NOTE — Discharge Instructions (Signed)
Your x-ray reveals a fracture of the coccyx bone. Your examination suggests muscle strain involving the lumbar spine. Please use a doughnut pillow until you're able to use regular see your condition. Please use Robaxin 3 times daily. Use Tylenol for mild pain, use Norco for severe pain. May use promethazine for nausea if needed. These medications will cause drowsiness, please do not drive, operate machinery, or participate in activities requiring concentration.

## 2015-04-07 NOTE — ED Notes (Signed)
POC URINE PREG NEGATIVE.  

## 2015-04-07 NOTE — ED Notes (Signed)
Pt states her dog tripped her while going down stairs and fell down backwards on the steps, hitting her back. Pain to lower back. Occurred 5 min PTA.

## 2015-04-14 ENCOUNTER — Encounter: Payer: Self-pay | Admitting: Gastroenterology

## 2015-04-14 ENCOUNTER — Ambulatory Visit (INDEPENDENT_AMBULATORY_CARE_PROVIDER_SITE_OTHER): Payer: Self-pay | Admitting: Gastroenterology

## 2015-04-14 VITALS — BP 117/83 | HR 82 | Temp 97.1°F | Ht 68.0 in | Wt 233.2 lb

## 2015-04-14 DIAGNOSIS — K529 Noninfective gastroenteritis and colitis, unspecified: Secondary | ICD-10-CM

## 2015-04-14 NOTE — Progress Notes (Signed)
Referring Provider: No ref. provider found Primary Care Physician:  No PCP Per Patient  Primary GI: Dr. Gala Romney   Chief Complaint  Patient presents with  . Follow-up    Follow up ED visit    HPI:   Patricia Rivers is a 30 y.o. female presenting today with a history of a small carcinoid in the rectum and a tubulovillous adenoma with high-grade dysplasia removed from the ascending colon 2015. Followup colonoscopy demonstrated no evidence of residual lesion anywhere. She is slated to have another colonoscopy in June 2018. History of IBS-D. Last seen April 2016 and prescribed Viberzi 75 mg BID. Seen in ED early Jan 2017 with CT findings of mild terminal ileitis, reactive lymph nodes in the mesentery adjacent to the TI and cecum. Was prescribed empiric antibiotics. No improvement in diarrhea. Burning with bowel habits. Broke her "butt bone" a few days ago. States diarrhea is non-stop. Taking imodium without any improvement. Sitting on toilet almost all day long. Postprandial component. Diarrhea very bright green. Foul odor. On Keflex now for a bladder infection. Stopped Flagyl stating it was burning when she urinated. Still on Cipro. Abdominal pain at level of umbilicus and radiating to right side. Symptoms worsened since cholecystectomy. Viberzi samples helped; however, wasn't covered by patient assistance. Has well water.   Past Medical History  Diagnosis Date  . Right ankle injury   . Colon polyp   . Rectal polyp   . Carcinoid tumor of rectum   . 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 GERD (gastroesophageal reflux disease)   . Ovarian failure   . Chronic pelvic pain in female   . Kidney stone   . Chronic diarrhea   . Uterine fibroid   . Headache     Past Surgical History  Procedure Laterality Date  . Colonoscopy with esophagogastroduodenoscopy (egd) N/A 05/16/2013    Dr. Gala Romney: rectal polyp with low grade carcinoid tumor, colon polyp with tubulovillous  adenoma with high grade dysplasia. Needs repeat colonoscopy in 3 months. EGD with antral erosions, duodenal bulb erosions, superficial ulceration. path with mild chronic gastritis   . Colonoscopy with propofol N/A 09/06/2013    [id number] ileocolonoscopy. No evidence  of residual polyp anywhere. Next TCS 08/2016.  [name] Cholecystectomy      Current Outpatient Prescriptions  Medication Sig Dispense Refill  . aspirin-acetaminophen-caffeine (EXCEDRIN MIGRAINE) 250-250-65 MG tablet Take 2 tablets by mouth every 6 (six) hours as needed for headache.    . cephALEXin (KEFLEX) 500 MG capsule Take 1 capsule (500 mg total) by mouth 4 (four) times daily. 28 capsule 0  . ciprofloxacin (CIPRO) 500 MG tablet Take 1 tablet (500 mg total) by mouth 2 (two) times daily. 20 tablet 0  . HYDROcodone-acetaminophen (NORCO/VICODIN) 5-325 MG tablet Take 1 tablet by mouth every 4 (four) hours as needed. 20 tablet 0  . methocarbamol (ROBAXIN) 500 MG tablet Take 1 tablet (500 mg total) by mouth 3 (three) times daily. 21 tablet 0  . ondansetron (ZOFRAN) 4 MG tablet Take 1 tablet (4 mg total) by mouth every 6 (six) hours. 12 tablet 0  . phenazopyridine (PYRIDIUM) 200 MG tablet Take 1 tablet (200 mg total) by mouth 3 (three) times daily as needed for pain (urethral spasm). 10 tablet 1  . promethazine (PHENERGAN) 25 MG tablet Take 1 tablet (25 mg total) by mouth every 6 (six) hours as needed for nausea or vomiting. 12 tablet 0  . metroNIDAZOLE (FLAGYL)  500 MG tablet Take 1 tablet (500 mg total) by mouth 2 (two) times daily. (Patient not taking: Reported on 04/07/2015) 20 tablet 0  . traMADol (ULTRAM) 50 MG tablet Take 1 tablet (50 mg total) by mouth every 6 (six) hours as needed. (Patient not taking: Reported on 04/07/2015) 20 tablet 0   No current facility-administered medications for this visit.    Allergies as of 04/14/2015 - Review Complete 04/14/2015  Allergen Reaction Noted  . Codeine Nausea And Vomiting 12/15/2010  .  Erythromycin Itching 03/04/2014  . Hydromorphone hcl Nausea And Vomiting 12/15/2010  . Nsaids Other (See Comments) 05/20/2013  . Penicillins Hives 12/15/2010  . Bentyl [dicyclomine hcl] Rash 08/22/2013  . Latex Rash 05/10/2013  . Tape Itching and Rash 05/10/2013    Family History  Problem Relation Age of Onset  . Colon cancer Maternal Grandmother   . Cervical cancer Mother   . Asthma    . Diabetes      Social History   Social History  . Marital Status: Married    Spouse Name: N/A  . Number of Children: N/A  . Years of Education: N/A   Social History Main Topics  . Smoking status: Current Every Day Smoker -- 1.00 packs/day for 11 years    Types: Cigarettes  . Smokeless tobacco: None     Comment: 1/2 pack daily  . Alcohol Use: 0.0 oz/week    0 Standard drinks or equivalent per week     Comment: occasionally  . Drug Use: No  . Sexual Activity: Yes    Birth Control/ Protection: None   Other Topics Concern  . None   Social History Narrative    Review of Systems: As mentioned in HPI   Physical Exam: BP 117/83 mmHg  Pulse 82  Temp(Src) 97.1 F (36.2 C) (Oral)  Ht 5\' 8"  (1.727 m)  Wt 233 lb 3.2 oz (105.779 kg)  BMI 35.47 kg/m2  LMP 03/11/2015 General:   Alert and oriented. No distress noted. Pleasant and cooperative.  Head:  Normocephalic and atraumatic. Eyes:  Conjuctiva clear without scleral icterus. Mouth:  Oral mucosa pink and moist. Good dentition. No lesions. Heart:  S1, S2 present without murmurs, rubs, or gallops. Regular rate and rhythm. Abdomen:  +BS, soft, mild TTP diffusely and non-distended. No rebound or guarding. No HSM or masses noted. Msk:  Symmetrical without gross deformities. Normal posture. Extremities:  Without edema. Neurologic:  Alert and  oriented x4;  grossly normal neurologically. Psych:  Alert and cooperative. Normal mood and affect.  Lab Results  Component Value Date   WBC 8.3 03/20/2015   HGB 15.1* 03/20/2015   HCT 43.9  03/20/2015   MCV 90.7 03/20/2015   PLT 181 03/20/2015   Lab Results  Component Value Date   ALT 16 03/20/2015   AST 17 03/20/2015   ALKPHOS 78 03/20/2015   BILITOT 0.7 03/20/2015   Lab Results  Component Value Date   CREATININE 0.68 03/20/2015   BUN 11 03/20/2015   NA 138 03/20/2015   K 4.3 03/20/2015   CL 108 03/20/2015   CO2 23 03/20/2015

## 2015-04-14 NOTE — Patient Instructions (Signed)
Please complete the stool studies and urine 24 hour collection.  We have arranged a colonoscopy with Dr. Gala Romney in the near future.  Further recommendations after stool studies completed.

## 2015-04-15 NOTE — Assessment & Plan Note (Signed)
30 year old with a history of a small carcinoid in the rectum and a tubulovillous adenoma with high-grade dysplasia removed from the ascending colon 2015. Followup colonoscopy demonstrated no evidence of residual lesion anywhere in June 2015. She is slated to have another colonoscopy in June 2018. History of chronic diarrhea, felt to have component of IBS-D; she has done well on Viberzi in the past but due to lack of insurance it has been difficult to keep taking this. Since last seen in April 2016, she has had worsening diarrhea. Recent CT in ED with findings of mild terminal ileitis. No stool studies on file. I do not feel she has an acute infectious etiology, as she has a history of chronic diarrhea; however, she has been exposed to antibiotics and Cdiff needs to be ruled out. I do feel she needs an early interval colonoscopy as well due to advanced adenoma in 2015 and small carcinoid of rectum. Finally, I will order a urinary 5-HIAA due to her history of carcinoid.  Obtain stool studies now 24 hour urinary 5-HIAA Will need TCS with Dr. Gala Romney in the near future. Patient is applying for financial assistance. Risks and benefits discussed in detail with stated understanding. Will utilize Propofol due to polypharmacy.  Would benefit from [place] again in the future IF negative work-up.

## 2015-04-16 ENCOUNTER — Other Ambulatory Visit: Payer: Self-pay | Admitting: Gastroenterology

## 2015-04-16 NOTE — Progress Notes (Signed)
No pcp per patient 

## 2015-04-17 LAB — CLOSTRIDIUM DIFFICILE BY PCR: Toxigenic C. Difficile by PCR: NOT DETECTED

## 2015-04-18 LAB — GIARDIA ANTIGEN

## 2015-04-20 LAB — STOOL CULTURE

## 2015-04-23 ENCOUNTER — Ambulatory Visit (INDEPENDENT_AMBULATORY_CARE_PROVIDER_SITE_OTHER): Payer: Self-pay | Admitting: Obstetrics & Gynecology

## 2015-04-23 ENCOUNTER — Encounter: Payer: Self-pay | Admitting: Obstetrics & Gynecology

## 2015-04-23 VITALS — BP 121/85 | HR 80 | Temp 97.9°F | Wt 232.0 lb

## 2015-04-23 DIAGNOSIS — N898 Other specified noninflammatory disorders of vagina: Secondary | ICD-10-CM

## 2015-04-23 DIAGNOSIS — Z113 Encounter for screening for infections with a predominantly sexual mode of transmission: Secondary | ICD-10-CM

## 2015-04-23 LAB — POCT URINALYSIS DIP (DEVICE)
Glucose, UA: NEGATIVE mg/dL
Hgb urine dipstick: NEGATIVE
Ketones, ur: NEGATIVE mg/dL
Leukocytes, UA: NEGATIVE
Nitrite: NEGATIVE
Protein, ur: NEGATIVE mg/dL
Specific Gravity, Urine: >=1.03 (ref 1.005–1.030)
Urobilinogen, UA: 1 mg/dL (ref 0.0–1.0)
pH: 5.5 (ref 5.0–8.0)

## 2015-04-23 LAB — 5 HIAA W/CREATININE, 24 HR
5-HIAA W/CREATININE 24 HR UR: 6.9 mg/(24.h) — AB (ref <=6.0)
CREATININE 24 HR UR-5 HIAA W/CREAT: 1.53 g/(24.h) (ref 0.63–2.50)
TOTAL VOLUME 5 HIAA W/ CREATININE: 1100 mL

## 2015-04-23 NOTE — Progress Notes (Signed)
Patient ID: Patricia Rivers, female   DOB: 1985/09/14, 30 y.o.   MRN: [id number] History:  30 y.o. No obstetric history on file. here today for compliaints of vulvar burning. Sx are worse with intercourse or voiding.  Sx are absent without those activites.  She denies pain outside of intercourse. She denies buring outside of urination.  She was here prev and was dx'd with a UTI and treated. She reports no change in sx. She reports voiding 2-3 times/day.  This is a chronic probrlem.  She was seen prev for these sx and was started on EES but she reports stopping it after 2 weeks. Neither she nor her spouse remember why she stopped it but, she thinks it was due to it not working  The following portions of the patient's history were reviewed and updated as appropriate: allergies, current medications, past family history, past medical history, past social history, past surgical history and problem list.  Review of Systems:  Pertinent items are noted in HPI.  Objective:  Physical Exam Blood pressure 121/85, pulse 80, temperature 97.9 F (36.6 C), weight 232 lb (105.235 kg). Gen: NAD Abd: Soft, nontender and nondistended Pelvic: Normal appearing external genitalia; no lesion are noted.  The tissues is atrophic; normal appearing vaginal mucosa. Normal discharge.    Labs and Imaging Dg Lumbar Spine Complete  04/07/2015  CLINICAL DATA:  Pain following fall EXAM: LUMBAR SPINE - COMPLETE 4+ VIEW COMPARISON:  October 19, 2013 FINDINGS: Frontal, lateral, spot lumbosacral lateral, and bilateral oblique views were obtained. There are 5 non-rib-bearing lumbar type vertebral bodies. There is no fracture or spondylolisthesis. Disc spaces appear unremarkable. There is no appreciable facet arthropathy. IMPRESSION: No fracture or spondylolisthesis. No appreciable arthropathic change. Electronically Signed   By: Lowella Grip III M.D.   On: 04/07/2015 15:50   Dg Sacrum/coccyx  04/07/2015  CLINICAL DATA:  Severe sharp  lower back pain after falling down steps outside home earlier today. EXAM: SACRUM AND COCCYX - 2+ VIEW COMPARISON:  None. FINDINGS: There it his questionable cortical disruption along the anterior aspect of the upper coccyx, possibly nondisplaced fracture. No additional suspicious bony abnormality. SI joints are symmetric and unremarkable. IMPRESSION: Questionable cortical disruption/fracture along the anterior upper coccyx. Electronically Signed   By: Rolm Baptise M.D.   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  Kanai Berrios L. Harraway-Smith, M.D., [place]. Harraway-Smith, M.D., Cherlynn June

## 2015-04-24 LAB — CERVICOVAGINAL ANCILLARY ONLY: WET PREP (BD AFFIRM): NEGATIVE

## 2015-04-25 ENCOUNTER — Telehealth: Payer: Self-pay | Admitting: *Deleted

## 2015-04-25 ENCOUNTER — Emergency Department (HOSPITAL_COMMUNITY): Payer: Self-pay

## 2015-04-25 ENCOUNTER — Encounter (HOSPITAL_COMMUNITY): Payer: Self-pay | Admitting: Emergency Medicine

## 2015-04-25 ENCOUNTER — Emergency Department (HOSPITAL_COMMUNITY)
Admission: EM | Admit: 2015-04-25 | Discharge: 2015-04-25 | Disposition: A | Discharge status: Home / Self Care | Payer: Self-pay | Attending: Emergency Medicine | Admitting: Emergency Medicine

## 2015-04-25 DIAGNOSIS — Z8639 Personal history of other endocrine, nutritional and metabolic disease: Secondary | ICD-10-CM | POA: Insufficient documentation

## 2015-04-25 DIAGNOSIS — Y9289 Other specified places as the place of occurrence of the external cause: Secondary | ICD-10-CM | POA: Insufficient documentation

## 2015-04-25 DIAGNOSIS — S93401A Sprain of unspecified ligament of right ankle, initial encounter: Secondary | ICD-10-CM | POA: Insufficient documentation

## 2015-04-25 DIAGNOSIS — Z792 Long term (current) use of antibiotics: Secondary | ICD-10-CM | POA: Insufficient documentation

## 2015-04-25 DIAGNOSIS — G8929 Other chronic pain: Secondary | ICD-10-CM | POA: Insufficient documentation

## 2015-04-25 DIAGNOSIS — Z86018 Personal history of other benign neoplasm: Secondary | ICD-10-CM | POA: Insufficient documentation

## 2015-04-25 DIAGNOSIS — Y9389 Activity, other specified: Secondary | ICD-10-CM | POA: Insufficient documentation

## 2015-04-25 DIAGNOSIS — Y998 Other external cause status: Secondary | ICD-10-CM | POA: Insufficient documentation

## 2015-04-25 DIAGNOSIS — Z87442 Personal history of urinary calculi: Secondary | ICD-10-CM | POA: Insufficient documentation

## 2015-04-25 DIAGNOSIS — W1789XA Other fall from one level to another, initial encounter: Secondary | ICD-10-CM | POA: Insufficient documentation

## 2015-04-25 DIAGNOSIS — Z880 Allergy status to penicillin: Secondary | ICD-10-CM | POA: Insufficient documentation

## 2015-04-25 DIAGNOSIS — Z8719 Personal history of other diseases of the digestive system: Secondary | ICD-10-CM | POA: Insufficient documentation

## 2015-04-25 DIAGNOSIS — S4991XA Unspecified injury of right shoulder and upper arm, initial encounter: Secondary | ICD-10-CM | POA: Insufficient documentation

## 2015-04-25 DIAGNOSIS — F17210 Nicotine dependence, cigarettes, uncomplicated: Secondary | ICD-10-CM | POA: Insufficient documentation

## 2015-04-25 DIAGNOSIS — Z86010 Personal history of colonic polyps: Secondary | ICD-10-CM | POA: Insufficient documentation

## 2015-04-25 DIAGNOSIS — Z85040 Personal history of malignant carcinoid tumor of rectum: Secondary | ICD-10-CM | POA: Insufficient documentation

## 2015-04-25 DIAGNOSIS — Z91040 Latex allergy status: Secondary | ICD-10-CM | POA: Insufficient documentation

## 2015-04-25 NOTE — Telephone Encounter (Signed)
Called pt and provided results of recent test which was negative for vaginal yeast, BV and Trich. Per Dr. Ihor Dow, she should use the prescription cream once nightly for a week, then 2x/wk. Pt stated that the pharmacy had questions about the prescription and needed additional information. I advised pt that I will call the pharmacy and provide the necessary information.  Pt voiced understanding of all information and instructions.

## 2015-04-25 NOTE — ED Provider Notes (Signed)
CSN: [id number]     Arrival date & time 04/25/15  1429 History   First MD Initiated Contact with Patient 04/25/15 1507     Chief Complaint  Patient presents with  . Foot Injury     (Consider location/radiation/quality/duration/timing/severity/associated sxs/prior Treatment) Patient is a 30 y.o. female presenting with foot injury. The history is provided by the patient.  Foot Injury Location:  Ankle Time since incident:  1 day Injury: yes (Twisted ankle getting out of a truck on yesterday.)   Ankle location:  R ankle Pain details:    Quality:  Aching   Severity:  Moderate   Onset quality:  Gradual   Timing:  Intermittent   Progression:  Worsening Chronicity:  New Dislocation: no   Relieved by:  Nothing Ineffective treatments:  Elevation Associated symptoms: decreased ROM and swelling   Associated symptoms: no numbness and no tingling     Past Medical History  Diagnosis Date  . Right ankle injury   . Colon polyp   . Rectal polyp   . Carcinoid tumor of rectum   . 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 GERD (gastroesophageal reflux disease)   . Ovarian failure   . Chronic pelvic pain in female   . Kidney stone   . Chronic diarrhea   . Uterine fibroid   . Headache    Past Surgical History  Procedure Laterality Date  . Colonoscopy with esophagogastroduodenoscopy (egd) N/A 05/16/2013    Dr. Gala Romney: rectal polyp with low grade carcinoid tumor, colon polyp with tubulovillous adenoma with high grade dysplasia. Needs repeat colonoscopy in 3 months. EGD with antral erosions, duodenal bulb erosions, superficial ulceration. path with mild chronic gastritis   . Colonoscopy with propofol N/A 09/06/2013    [id number] ileocolonoscopy. No evidence  of residual polyp anywhere. Next TCS 08/2016.  [name] Cholecystectomy     Family History  Problem Relation Age of Onset  . Colon cancer Maternal Grandmother   . Cervical cancer Mother   . Asthma    . Diabetes      Social History  Substance Use Topics  . Smoking status: Current Every Day Smoker -- 1.00 packs/day for 11 years    Types: Cigarettes  . Smokeless tobacco: None     Comment: 1/2 pack daily  . Alcohol Use: 0.0 oz/week    0 Standard drinks or equivalent per week     Comment: occasionally   OB History    No data available     Review of Systems  Musculoskeletal: Positive for arthralgias.  All other systems reviewed and are negative.     Allergies  Codeine; Erythromycin; Hydromorphone hcl; Nsaids; Penicillins; Bentyl; Latex; and Tape  Home Medications   Prior to Admission medications   Medication Sig Start Date End Date Taking? Authorizing Provider  aspirin-acetaminophen-caffeine (EXCEDRIN MIGRAINE) [id number] MG tablet Take 2 tablets by mouth every 6 (six) hours as needed for headache.    Historical Provider, MD  cephALEXin (KEFLEX) 500 MG capsule Take 1 capsule (500 mg total) by mouth 4 (four) times daily. 04/04/15   Lavonia Drafts, MD  ciprofloxacin (CIPRO) 500 MG tablet Take 1 tablet (500 mg total) by mouth 2 (two) times daily. 03/20/15   Ezequiel Essex, MD  HYDROcodone-acetaminophen (NORCO/VICODIN) 5-325 MG tablet Take 1 tablet by mouth every 4 (four) hours as needed. Patient not taking: Reported on 04/23/2015 04/07/15   Lily Kocher, PA-C  methocarbamol (ROBAXIN) 500 MG tablet Take 1 tablet (500 mg  total) by mouth 3 (three) times daily. Patient not taking: Reported on 04/23/2015 04/07/15   Lily Kocher, PA-C  ondansetron (ZOFRAN) 4 MG tablet Take 1 tablet (4 mg total) by mouth every 6 (six) hours. 03/20/15   Ezequiel Essex, MD  phenazopyridine (PYRIDIUM) 200 MG tablet Take 1 tablet (200 mg total) by mouth 3 (three) times daily as needed for pain (urethral spasm). Patient not taking: Reported on 04/23/2015 04/04/15   Lavonia Drafts, MD  PRESCRIPTION MEDICATION E3/E2 (80:20) cream.  Use 1 gram vaginally bi-weekly    Historical Provider, MD  promethazine (PHENERGAN) 25  MG tablet Take 1 tablet (25 mg total) by mouth every 6 (six) hours as needed for nausea or vomiting. 04/07/15   Lily Kocher, PA-C   BP 113/77 mmHg  Pulse 88  Temp(Src) 98.1 F (36.7 C) (Oral)  Resp 18  Ht 5\' 8"  (1.727 m)  Wt 105.235 kg  BMI 35.28 kg/m2  SpO2 99%  LMP 04/11/2015 Physical Exam  Constitutional: She is oriented to person, place, and time. She appears well-developed and well-nourished.  Non-toxic appearance.  HENT:  Head: Normocephalic.  Right Ear: Tympanic membrane and external ear normal.  Left Ear: Tympanic membrane and external ear normal.  Eyes: EOM and lids are normal. Pupils are equal, round, and reactive to light.  Neck: Normal range of motion. Neck supple. Carotid bruit is not present.  Cardiovascular: Normal rate, regular rhythm, normal heart sounds, intact distal pulses and normal pulses.   Pulmonary/Chest: Breath sounds normal. No respiratory distress.  Abdominal: Soft. Bowel sounds are normal. There is no tenderness. There is no guarding.  Musculoskeletal:       Right shoulder: She exhibits decreased range of motion, tenderness, swelling and pain. She exhibits no deformity.  Lymphadenopathy:       Head (right side): No submandibular adenopathy present.       Head (left side): No submandibular adenopathy present.    She has no cervical adenopathy.  Neurological: She is alert and oriented to person, place, and time. She has normal strength. No cranial nerve deficit or sensory deficit.  Skin: Skin is warm and dry.  Psychiatric: She has a normal mood and affect. Her speech is normal.  Nursing note and vitals reviewed.   ED Course  Procedures (including critical care time) Labs Review Labs Reviewed - No data to display  Imaging Review Dg Foot Complete Right  04/25/2015  CLINICAL DATA:  Pain and swelling at the top of the right foot, status post fall. EXAM: RIGHT FOOT COMPLETE - 3+ VIEW COMPARISON:  None. FINDINGS: There is no evidence of fracture or  dislocation. There is no evidence of arthropathy or other focal bone abnormality. Dorsal soft tissue swelling of the forefoot is noted. IMPRESSION: No evidence of fracture or subluxation of the right foot. Electronically Signed   By: Fidela Salisbury M.D.   On: 04/25/2015 14:59   I have personally reviewed and evaluated these images and lab results as part of my medical decision-making.   EKG Interpretation None      MDM  Vital signs reviewed. X-ray of the right foot/ankle is negative fracture or dislocation.  Discussed x-rays with the patient. Patient fitted with an ankle stirrup splint on. Patient states she has crutches at home. Ice pack provided. Patient will follow with primary physician if any changes, problems, or concerns.    Final diagnoses:  Ankle sprain, right, initial encounter    *I have reviewed nursing notes, vital signs, and all appropriate lab  and imaging results for this patient.[id number] PA-C 04/25/15 1530  Virgel Manifold, MD 04/29/15 [phone]

## 2015-04-25 NOTE — Discharge Instructions (Signed)
The x-rays of your ankle are negative for fracture or dislocation. Your examination is negative for an acute neurologic or vascular problem. Your examination favors a sprain of the ankle. Please apply ice, please elevate the ankle is much as possible. Usually her crutches until you can safely apply weight. Ankle Sprain An ankle sprain is an injury to the strong, fibrous tissues (ligaments) that hold the bones of your ankle joint together.  CAUSES An ankle sprain is usually caused by a fall or by twisting your ankle. Ankle sprains most commonly occur when you step on the outer edge of your foot, and your ankle turns inward. People who participate in sports are more prone to these types of injuries.  SYMPTOMS   Pain in your ankle. The pain may be present at rest or only when you are trying to stand or walk.  Swelling.  Bruising. Bruising may develop immediately or within 1 to 2 days after your injury.  Difficulty standing or walking, particularly when turning corners or changing directions. DIAGNOSIS  Your caregiver will ask you details about your injury and perform a physical exam of your ankle to determine if you have an ankle sprain. During the physical exam, your caregiver will press on and apply pressure to specific areas of your foot and ankle. Your caregiver will try to move your ankle in certain ways. An X-ray exam may be done to be sure a bone was not broken or a ligament did not separate from one of the bones in your ankle (avulsion fracture).  TREATMENT  Certain types of braces can help stabilize your ankle. Your caregiver can make a recommendation for this. Your caregiver may recommend the use of medicine for pain. If your sprain is severe, your caregiver may refer you to a surgeon who helps to restore function to parts of your skeletal system (orthopedist) or a physical therapist. [place] ice to your injury for 1-2 days or as directed by your caregiver. Applying  ice helps to reduce inflammation and pain.  Put ice in a plastic bag.  Place a towel between your skin and the bag.  Leave the ice on for 15-20 minutes at a time, every 2 hours while you are awake.  Only take over-the-counter or prescription medicines for pain, discomfort, or fever as directed by your caregiver.  Elevate your injured ankle above the level of your heart as much as possible for 2-3 days.  If your caregiver recommends crutches, use them as instructed. Gradually put weight on the affected ankle. Continue to use crutches or a cane until you can walk without feeling pain in your ankle.  If you have a plaster splint, wear the splint as directed by your caregiver. Do not rest it on anything harder than a pillow for the first 24 hours. Do not put weight on it. Do not get it wet. You may take it off to take a shower or bath.  You may have been given an elastic bandage to wear around your ankle to provide support. If the elastic bandage is too tight (you have numbness or tingling in your foot or your foot becomes cold and blue), adjust the bandage to make it comfortable.  If you have an air splint, you may blow more air into it or let air out to make it more comfortable. You may take your splint off at night and before taking a shower or bath. Wiggle your toes in the splint several times per  day to decrease swelling. SEEK MEDICAL CARE IF:   You have rapidly increasing bruising or swelling.  Your toes feel extremely cold or you lose feeling in your foot.  Your pain is not relieved with medicine. SEEK IMMEDIATE MEDICAL CARE IF:  Your toes are numb or blue.  You have severe pain that is increasing. MAKE SURE YOU:   Understand these instructions.  Will watch your condition.  Will get help right away if you are not doing well or get worse.   This information is not intended to replace advice given to you by your health care provider. Make sure you discuss any questions you  have with your health care provider.   Document Released: 03/01/2005 Document Revised: 03/22/2014 Document Reviewed: 03/13/2011 Elsevier Interactive Patient Education [employer].

## 2015-04-25 NOTE — ED Notes (Signed)
Patient states she fell getting out of a truck yesterday and twisted her right ankle. Complaining of pain to entire right foot.

## 2015-04-28 ENCOUNTER — Telehealth: Payer: Self-pay | Admitting: General Practice

## 2015-04-28 NOTE — Telephone Encounter (Signed)
Patient called wanting to know about her financial assistance.  I called Genoveva Ill with [facility] and she stated that the patient's charity application was denied, because she is able to receive coverage through her husband's insurance.  Caryl Pina spoke with the patient and made her aware of the denial and gave her the number to the [facility] to workout payment arrangements.

## 2015-04-29 ENCOUNTER — Telehealth: Payer: Self-pay | Admitting: Orthopedic Surgery

## 2015-04-29 ENCOUNTER — Telehealth: Payer: Self-pay | Admitting: Internal Medicine

## 2015-04-29 ENCOUNTER — Other Ambulatory Visit: Payer: Self-pay

## 2015-04-29 MED ORDER — PEG 3350-KCL-NA BICARB-NACL 420 G PO SOLR: 4000.0000 mL | ORAL | Status: DC

## 2015-04-29 NOTE — Progress Notes (Signed)
Quick Note:  Stool studies all negative. Her 5-HIAA is very slightly elevated. This could be elevated for other reasons. Discussed with Tom, PA with Hem/Onc. Let's get a serum serotonin level. IF this is elevated or concern for carcinoid recurrence, we could pursue an octreotide scan. Keep plans for colonoscopy. Need serotonin level. ______

## 2015-04-29 NOTE — Telephone Encounter (Signed)
Pt seen AS on 1/30 and called today saying that she was denied [place] assistance, but was added on to her husband's [facility] and wanted to get scheduled for her colonoscopy and also wanted to know how much up front she would have to pay. Please advise and call patient at [phone].  It hasn't been 30 days since OV so can we get her procedure scheduled or do I need to bring her back in prior to scheduling?

## 2015-04-29 NOTE — Telephone Encounter (Signed)
No we was holding her a spot on 05/12/15. She has a insurance that will go into effective on 05/09/15 with [facility]. I have put in her orders for the TCS and send her the instructions in the mail. We will check her insurance on 05/09/15.

## 2015-04-29 NOTE — Telephone Encounter (Signed)
Call received from patient, inquiring about appointment following Emergency Room visit 04/25/15 for problem of right foot injury - notes and report indicate sprain; patient thinks "may be broke."  Offered appointment and discussed self-pay minimum; patient states she has just applied for [employer], and that will go into effect 05/09/15.  Discussed that if this is the date coverage begins, then it will not cover injury that occurred prior.  She will further check and call back regarding scheduling appointment.  Her ph# [phone]

## 2015-05-02 ENCOUNTER — Other Ambulatory Visit: Payer: Self-pay

## 2015-05-02 ENCOUNTER — Other Ambulatory Visit: Payer: Self-pay | Admitting: Gastroenterology

## 2015-05-02 DIAGNOSIS — D3A026 Benign carcinoid tumor of the rectum: Secondary | ICD-10-CM

## 2015-05-06 ENCOUNTER — Telehealth: Payer: Self-pay | Admitting: Internal Medicine

## 2015-05-06 NOTE — Telephone Encounter (Signed)
Pt has been canceled.

## 2015-05-06 NOTE — Telephone Encounter (Signed)
Pt is moving to [place] Saturday and needs to cancel her pre op and procedure.

## 2015-05-08 ENCOUNTER — Other Ambulatory Visit (HOSPITAL_COMMUNITY): Payer: Self-pay

## 2015-05-09 NOTE — Telephone Encounter (Signed)
No further response. 

## 2015-05-12 ENCOUNTER — Encounter (HOSPITAL_COMMUNITY): Payer: Self-pay

## 2015-05-12 ENCOUNTER — Ambulatory Visit (HOSPITAL_COMMUNITY): Admit: 2015-05-12 | Payer: Self-pay | Admitting: Internal Medicine

## 2015-05-12 SURGERY — COLONOSCOPY WITH PROPOFOL
Anesthesia: Monitor Anesthesia Care

## 2015-08-20 IMAGING — CR DG CHEST 2V
2 series · 2 of 2 positions shown · non-contrast
Comparison: PA and lateral chest 12/30/2009. Single view of the
chest 12/13/2013.

CLINICAL DATA: Productive cough and generalized body aches
beginning yesterday. Smoker.

EXAM:
CHEST  2 VIEW

[view not recorded (1 of 2)]
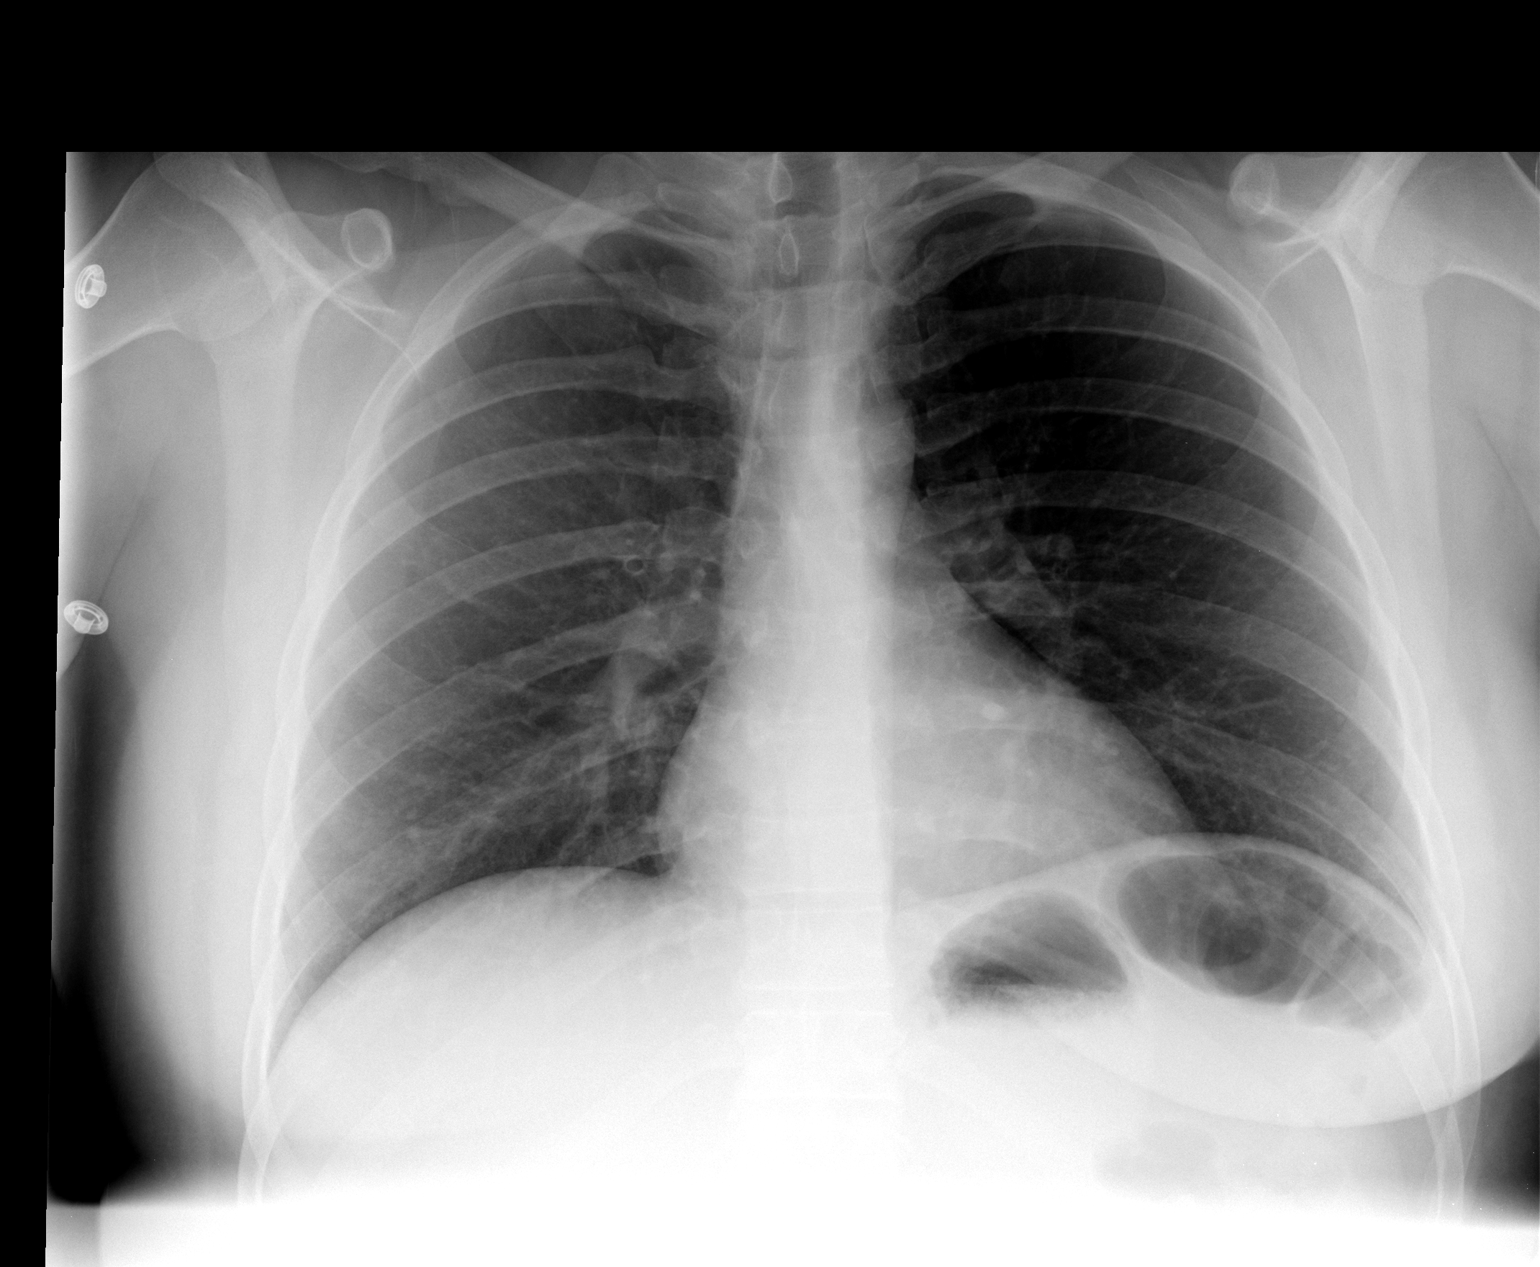

[view not recorded (2 of 2)]
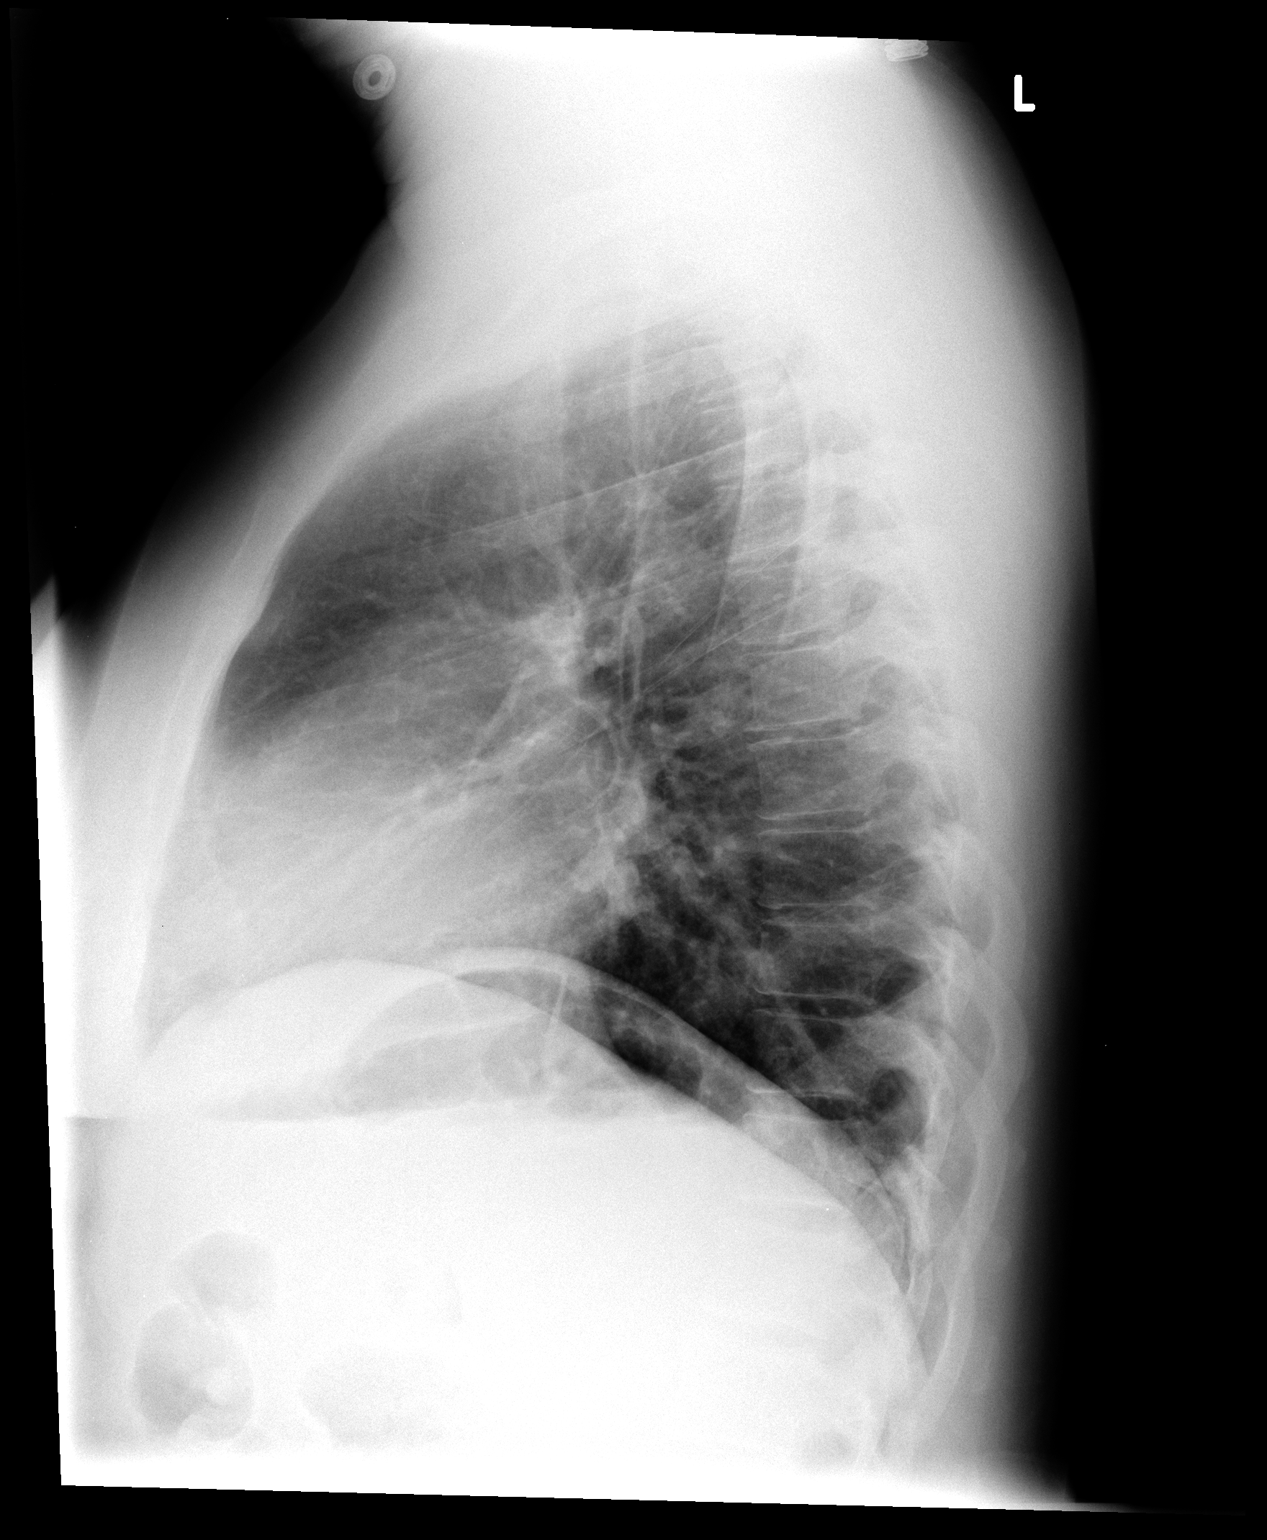

[2 of 2 positions shown; findings below may reference images not displayed]

FINDINGS: Heart size and mediastinal contours are within normal limits. Both
lungs are clear. Visualized skeletal structures are unremarkable.
IMPRESSION: Negative exam.

## 2016-06-30 IMAGING — DX DG WRIST COMPLETE 3+V*L*
2 series · 2 of 2 positions shown · non-contrast
Comparison: 12/05/2013, 12/15/2010

CLINICAL DATA: Generalized left wrist and hand pain. Pt stated she
was spanking her dog when she hit her hand on the ground. Wrist was
in radial deviated position. Unable to completely straighten and
could not do ulnar deviation. NO prior injuries.

EXAM:
LEFT WRIST - COMPLETE 3+ VIEW

[wrist obl]
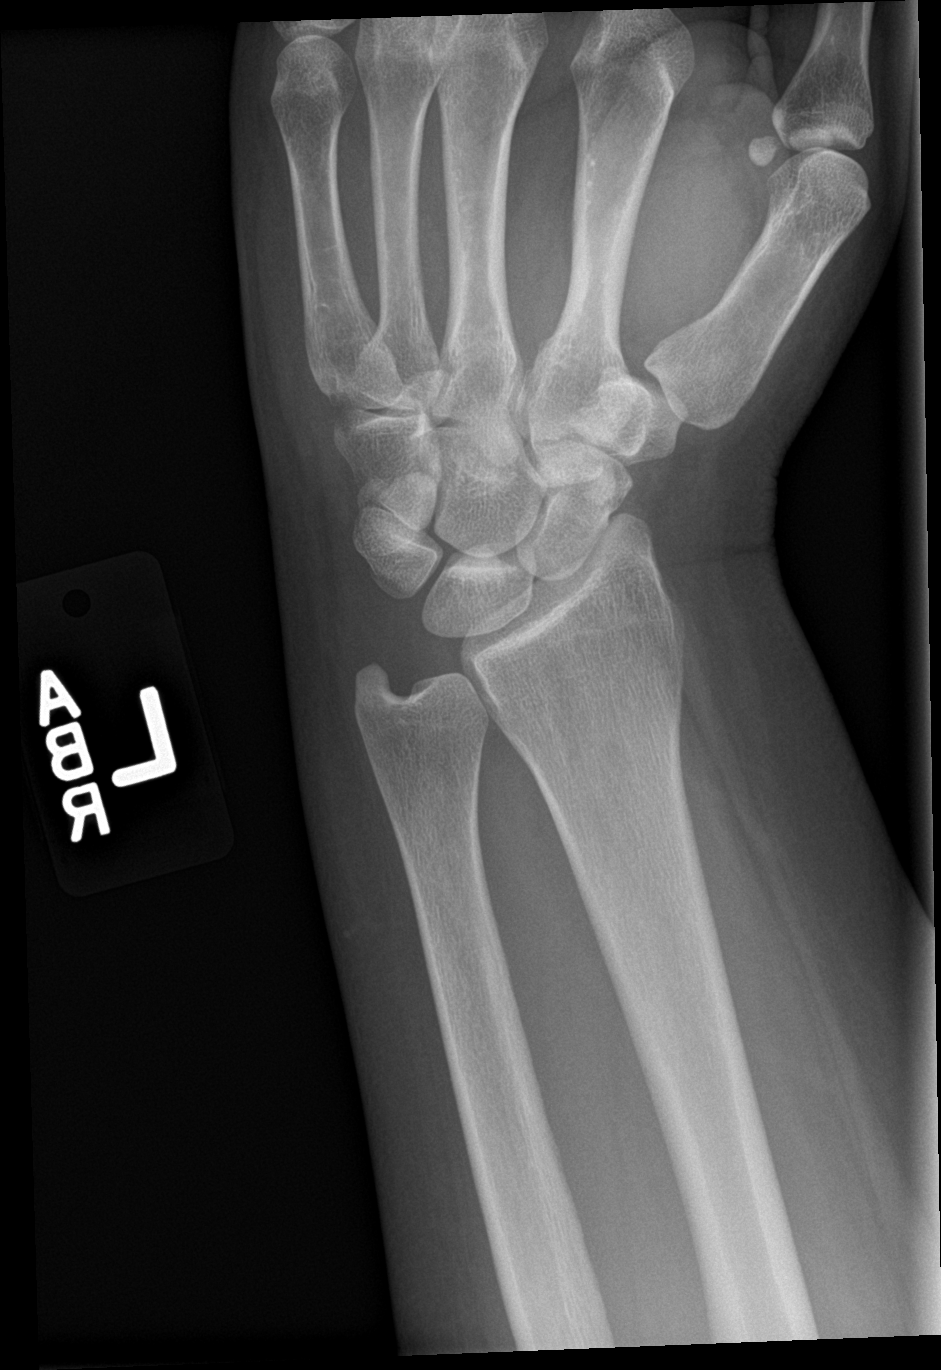

[wrist lat]
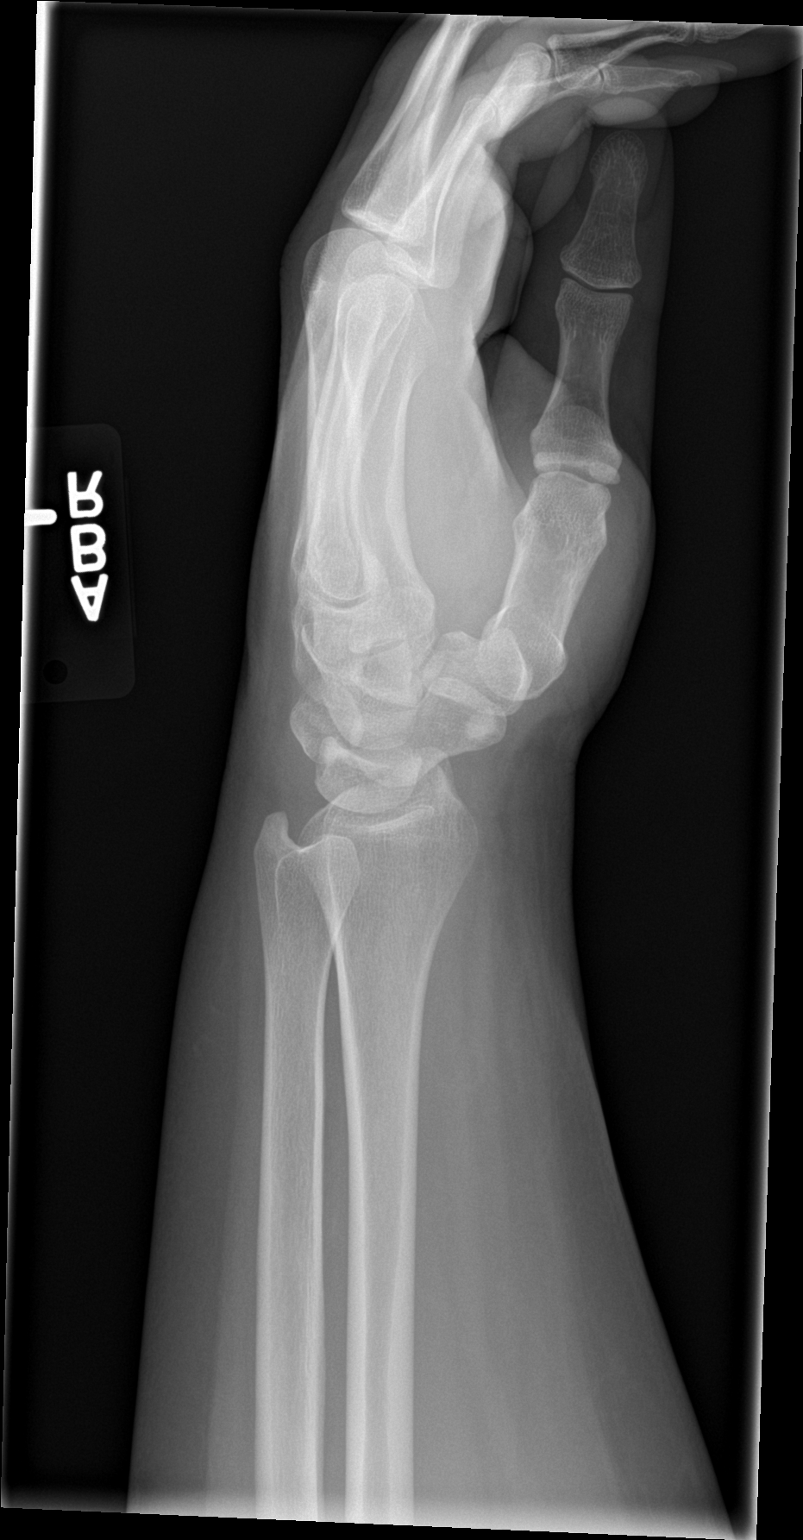

[2 of 2 positions shown; findings below may reference images not displayed]

FINDINGS: There is no evidence of fracture or dislocation. There is no
evidence of arthropathy or other focal bone abnormality. Soft
tissues are unremarkable.
IMPRESSION: Negative.

## 2016-08-23 ENCOUNTER — Encounter: Payer: Self-pay | Admitting: Internal Medicine

## 2016-10-08 ENCOUNTER — Emergency Department (HOSPITAL_COMMUNITY): Admission: EM | Admit: 2016-10-08 | Discharge: 2016-10-08 | Discharge status: Absent without leave | Payer: Self-pay

## 2016-10-14 IMAGING — CT CT ABD-PELV W/ CM
2 of 4 series · 16 of 46 positions shown, 18 images · IV contrast (Omnipaque 300)
Comparison: Six prior CT abdomen and pelvis examinations dating
back to 12/16/2005, most recently 11/12/2014.

CLINICAL DATA: 29-year-old presenting with right lower quadrant
abdominal pain associated with nausea, vomiting and diarrhea over
the past several days. Surgical history includes cholecystectomy.
Personal history of tubulovillous adenoma of the colon and carcinoid
tumor of the rectum.

EXAM:
CT ABDOMEN AND PELVIS WITH CONTRAST
TECHNIQUE: Multidetector CT imaging of the abdomen and pelvis was performed
using the standard protocol following bolus administration of
intravenous contrast.
CONTRAST:  100mL OMNIPAQUE IOHEXOL 300 MG/ML IV. Oral contrast was
also administered.

[Series 2: abd_pel_with 5.0 b40f · axial · 0.88mm/px · z∈[-606,-136]mm · 13 of 104 slices shown, 15 images]
[im 5/104  soft-tissue]
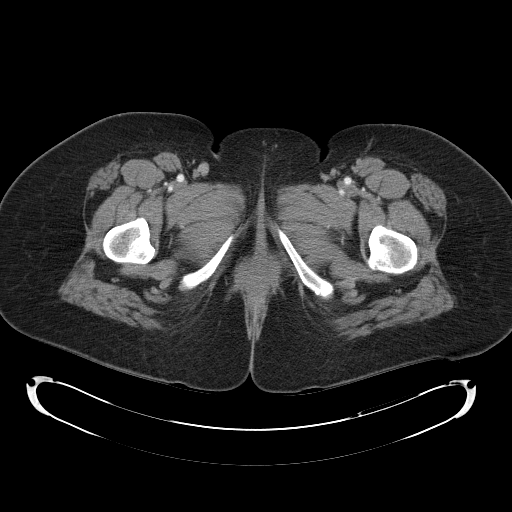
[im 5/104  bone]
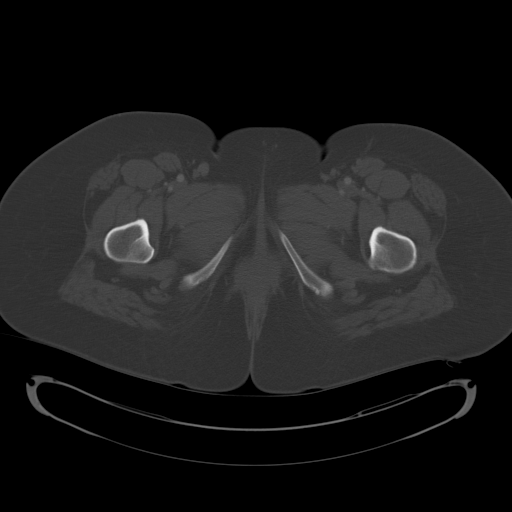
[im 13/104  soft-tissue]
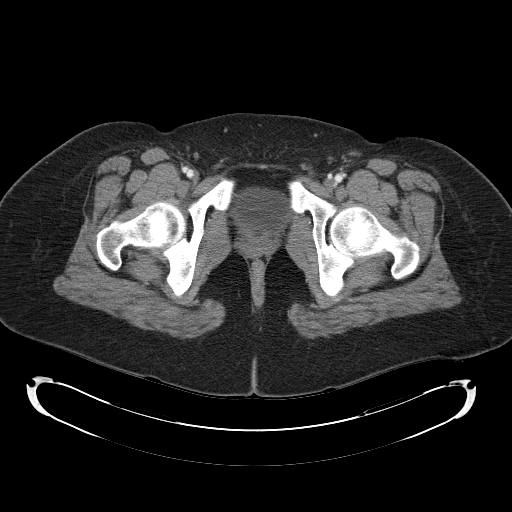
[im 21/104  soft-tissue]
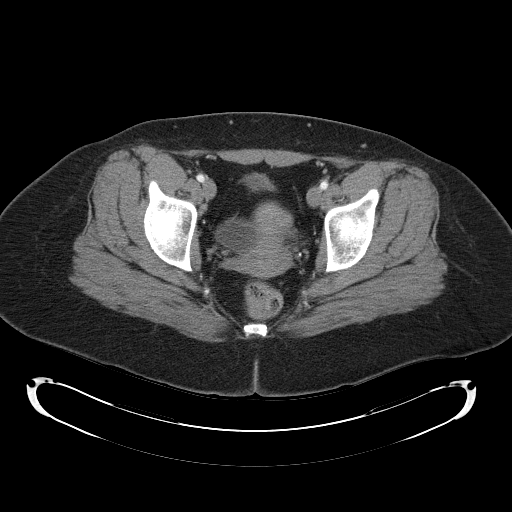
[im 29/104  soft-tissue]
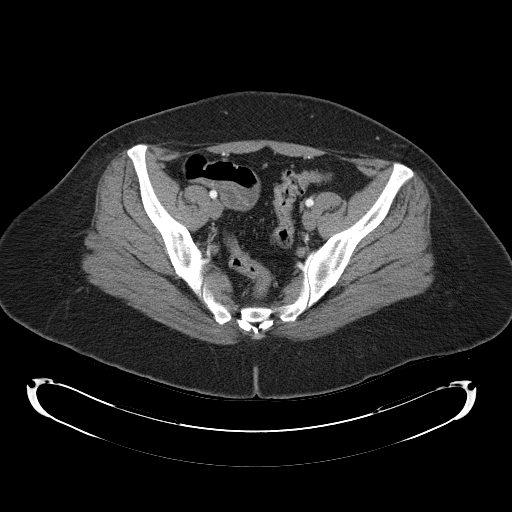
[im 38/104  soft-tissue]
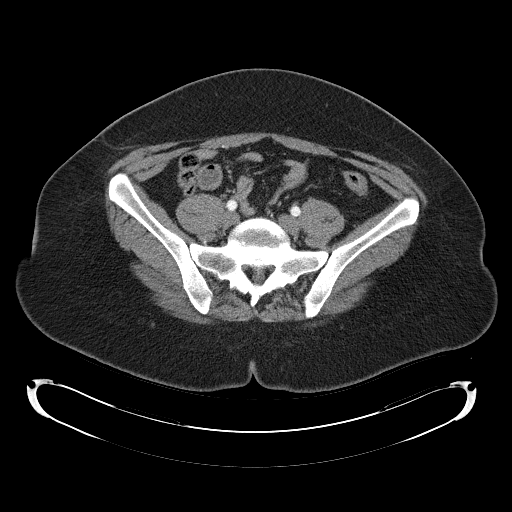
[im 46/104  soft-tissue]
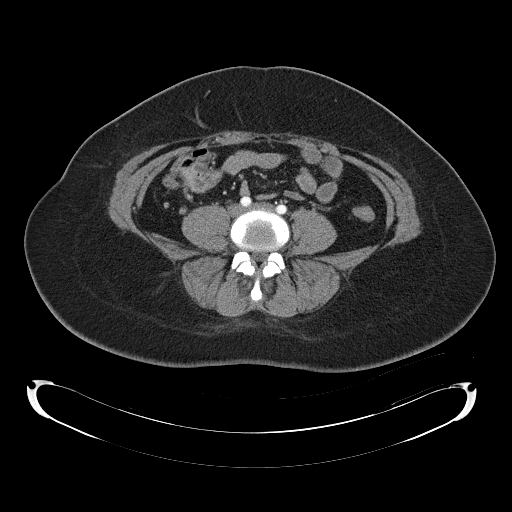
[im 54/104  soft-tissue]
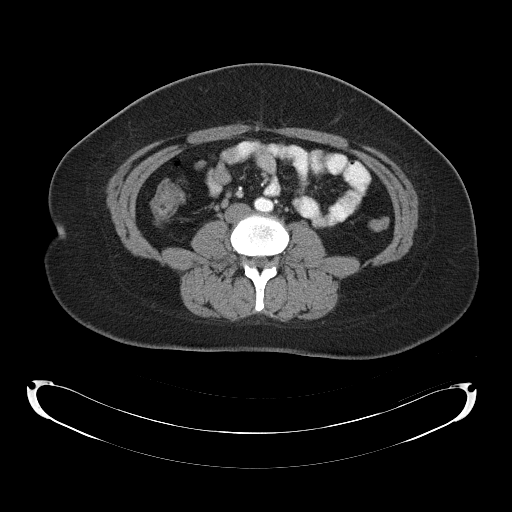
[im 58/104  soft-tissue]
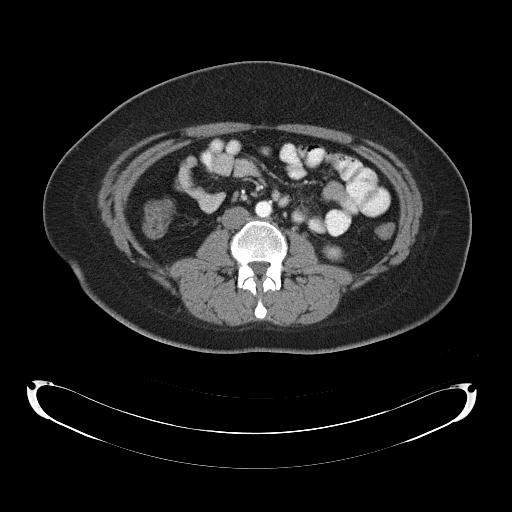
[im 66/104  soft-tissue]
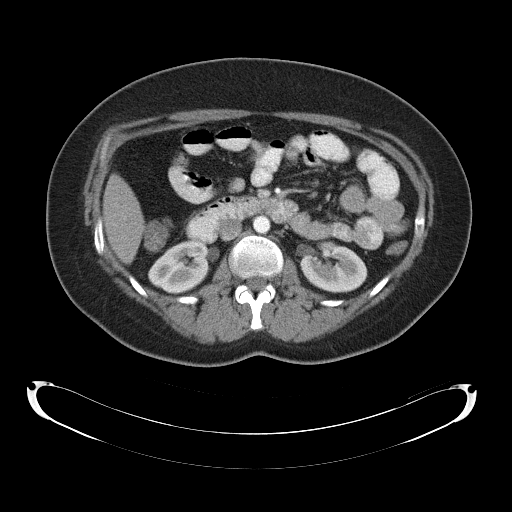
[im 66/104  bone]
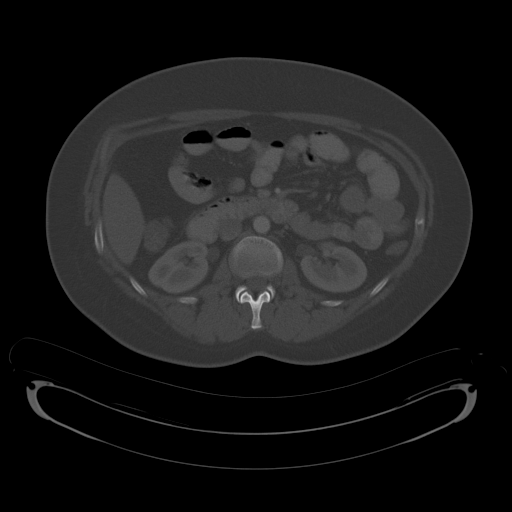
[im 75/104  soft-tissue]
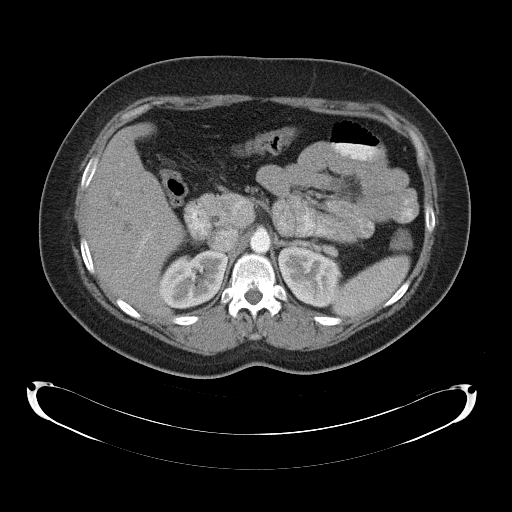
[im 83/104  soft-tissue]
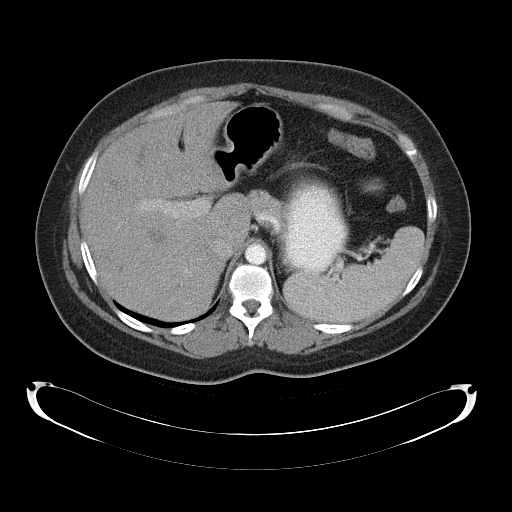
[im 91/104  soft-tissue]
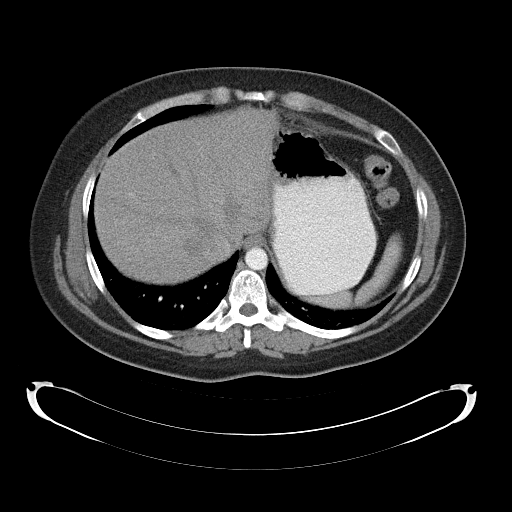
[im 99/104  soft-tissue]
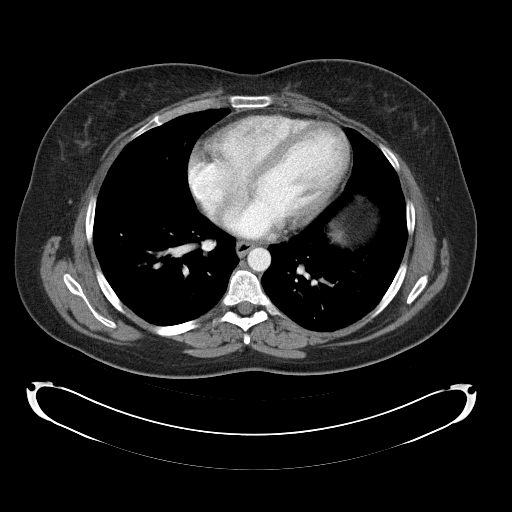

[Series 3: abd_pel_with 3.0 spo cor · coronal · 0.92mm/px · 3 of 82 slices shown]
[im 28/82  soft-tissue]
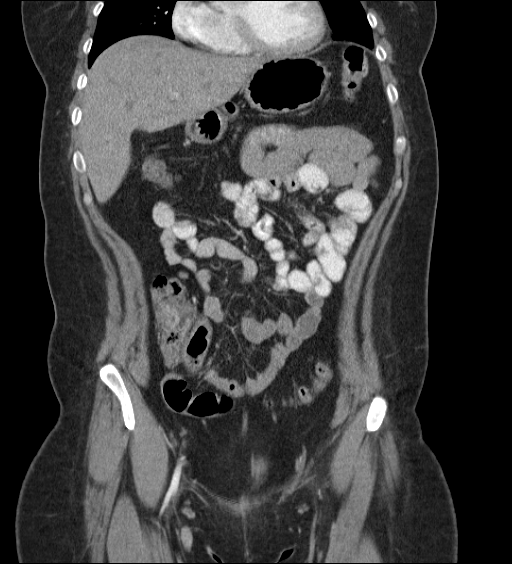
[im 37/82  soft-tissue]
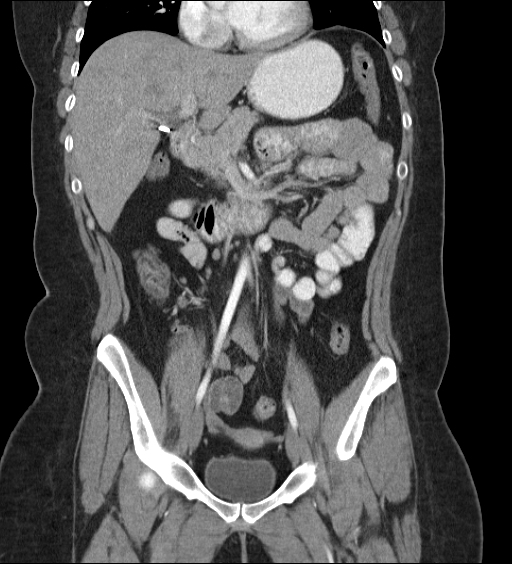
[im 46/82  soft-tissue]
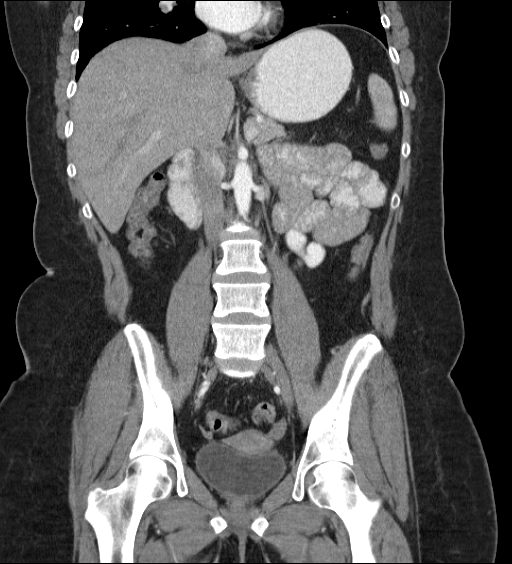

[16 of 46 positions shown; findings below may reference images not displayed]

FINDINGS: Lower chest:  Heart size normal.  Visualized lung bases clear.

Hepatobiliary: Liver normal in size and appearance. Gallbladder
surgically absent. No biliary dilation.

Pancreas: Normal in appearance without evidence of mass, ductal
dilation, or inflammation.

Spleen: Normal in size and appearance.

Adrenals/Urinary Tract: Normal appearing adrenal glands. Kidneys
normal in size and appearance without focal parenchymal abnormality.
No evidence of urinary tract calculi or obstruction.
Normal-appearing urinary bladder.

Stomach/Bowel: Stomach normal in appearance for the degree of
distention. Normal-appearing small bowel. Mild thickening of the
wall of the terminal ileum with enhancement of the mucosa. Remainder
the small bowel normal in appearance. Entire colon decompressed and
unremarkable. Normal appendix in the right mid abdomen.

Vascular/Lymphatic: No visible aortoiliofemoral atherosclerosis.
Widely patent visceral arteries.

Mildly enlarged lymph nodes in the mesentery adjacent to the cecum,
more prominent than on prior examinations. No pathologic
lymphadenopathy elsewhere.

Reproductive: Uterine fibroid identified on prior ultrasound not
visible on the current CT. Normal-appearing ovaries. No adnexal
masses.

Other: No ascites.

Musculoskeletal: Regional skeleton intact without acute or
significant osseous abnormality.
IMPRESSION: 1. Mild terminal ileitis. Reactive lymph nodes in the mesentery
adjacent to the terminal ileum and cecum.
2. No acute or significant abnormalities otherwise.

## 2023-03-11 ENCOUNTER — Emergency Department (HOSPITAL_COMMUNITY): Payer: Self-pay

## 2023-03-11 ENCOUNTER — Other Ambulatory Visit: Payer: Self-pay

## 2023-03-11 ENCOUNTER — Emergency Department (HOSPITAL_COMMUNITY)
Admission: EM | Admit: 2023-03-11 | Discharge: 2023-03-11 | Disposition: A | Discharge status: Home / Self Care | Payer: Self-pay | Attending: Emergency Medicine | Admitting: Emergency Medicine

## 2023-03-11 ENCOUNTER — Encounter (HOSPITAL_COMMUNITY): Payer: Self-pay | Admitting: *Deleted

## 2023-03-11 DIAGNOSIS — R6884 Jaw pain: Secondary | ICD-10-CM | POA: Insufficient documentation

## 2023-03-11 DIAGNOSIS — Z91040 Latex allergy status: Secondary | ICD-10-CM | POA: Insufficient documentation

## 2023-03-11 DIAGNOSIS — R220 Localized swelling, mass and lump, head: Secondary | ICD-10-CM | POA: Insufficient documentation

## 2023-03-11 LAB — CBC
HCT: 43.4 % (ref 36.0–46.0)
Hemoglobin: 14.6 g/dL (ref 12.0–15.0)
MCH: 30.7 pg (ref 26.0–34.0)
MCHC: 33.6 g/dL (ref 30.0–36.0)
MCV: 91.4 fL (ref 80.0–100.0)
Platelets: 195 10*3/uL (ref 150–400)
RBC: 4.75 MIL/uL (ref 3.87–5.11)
RDW: 12 % (ref 11.5–15.5)
WBC: 10.6 10*3/uL — ABNORMAL HIGH (ref 4.0–10.5)
nRBC: 0 % (ref 0.0–0.2)

## 2023-03-11 LAB — BASIC METABOLIC PANEL
Anion gap: 10 (ref 5–15)
BUN: 14 mg/dL (ref 6–20)
CO2: 25 mmol/L (ref 22–32)
Calcium: 9.6 mg/dL (ref 8.9–10.3)
Chloride: 104 mmol/L (ref 98–111)
Creatinine, Ser: 0.73 mg/dL (ref 0.44–1.00)
GFR, Estimated: >60 mL/min (ref >60)
Glucose, Bld: 101 mg/dL — ABNORMAL HIGH (ref 70–99)
Potassium: 4 mmol/L (ref 3.5–5.1)
Sodium: 139 mmol/L (ref 135–145)

## 2023-03-11 LAB — TROPONIN I (HIGH SENSITIVITY)
Troponin I (High Sensitivity): <2 ng/L (ref <18)
Troponin I (High Sensitivity): <2 ng/L (ref <18)

## 2023-03-11 MED ORDER — HYDROCODONE-ACETAMINOPHEN 5-325 MG PO TABS: 1.0000 | ORAL_TABLET | Freq: Four times a day (QID) | ORAL | 0 refills | Status: AC | PRN

## 2023-03-11 MED ORDER — CLINDAMYCIN HCL 150 MG PO CAPS
300.0000 mg | ORAL_CAPSULE | Freq: Once | ORAL | Status: AC
  Administered 2023-03-11: 300 mg via ORAL
  Filled 2023-03-11: qty 2

## 2023-03-11 MED ORDER — CLINDAMYCIN HCL 300 MG PO CAPS: 300.0000 mg | ORAL_CAPSULE | Freq: Four times a day (QID) | ORAL | 0 refills | Status: AC

## 2023-03-11 MED ORDER — IBUPROFEN 400 MG PO TABS
600.0000 mg | ORAL_TABLET | Freq: Once | ORAL | Status: AC
  Administered 2023-03-11: 600 mg via ORAL
  Filled 2023-03-11: qty 2

## 2023-03-11 NOTE — Discharge Instructions (Signed)
Begin taking clindamycin as prescribed.  Take ibuprofen 600 mg every 6 hours as needed for pain.  Begin taking hydrocodone as prescribed as needed for pain not relieved with ibuprofen.  Follow-up with your dentist in the next few days if symptoms are not improving.

## 2023-03-11 NOTE — ED Provider Notes (Signed)
[place] [facility] AT [facility] Provider Note   CSN: [id number] Arrival date & time: 03/11/23  1505     History  Chief Complaint  Patient presents with   Jaw Pain    Patricia Rivers is a 37 y.o. female.  Patient is a 37 year old female with no significant past medical history.  Patient presenting today with complaints of jaw pain.  This started 2 days ago in the absence of any injury or trauma.  She describes pain and swelling to her left lower jaw that is worse when she palpates the area.  She denies fevers or chills.  She denies sore throat or difficulty swallowing.  The history is provided by the patient.       Home Medications Prior to Admission medications   Medication Sig Start Date End Date Taking? Authorizing Provider  aspirin-acetaminophen-caffeine (EXCEDRIN MIGRAINE) [id number] MG tablet Take 2 tablets by mouth every 6 (six) hours as needed for headache.    [provider]      Allergies    Codeine, Erythromycin, Hydromorphone hcl, Nsaids, Penicillins, Bentyl [dicyclomine hcl], Latex, and Tape    Review of Systems   Review of Systems  All other systems reviewed and are negative.   Physical Exam Updated Vital Signs BP (!) 154/109   Pulse 74   Temp 98.4 F (36.9 C) (Oral)   Resp 16   Ht 5\' 8"  (1.727 m)   Wt 106.6 kg   SpO2 94%   BMI 35.73 kg/m  Physical Exam Vitals and nursing note reviewed.  Constitutional:      General: She is not in acute distress.    Appearance: She is well-developed. She is not diaphoretic.  HENT:     Head: Normocephalic and atraumatic.     Mouth/Throat:     Comments: There is tenderness overlying the left mandible.  Mild swelling is present. Cardiovascular:     Rate and Rhythm: Normal rate and regular rhythm.     Heart sounds: No murmur heard.    No friction rub. No gallop.  Pulmonary:     Effort: Pulmonary effort is normal. No respiratory distress.     Breath sounds: Normal breath  sounds. No wheezing.  Abdominal:     General: Bowel sounds are normal. There is no distension.     Palpations: Abdomen is soft.     Tenderness: There is no abdominal tenderness.  Musculoskeletal:        General: Normal range of motion.     Cervical back: Normal range of motion and neck supple.  Skin:    General: Skin is warm and dry.  Neurological:     General: No focal deficit present.     Mental Status: She is alert and oriented to person, place, and time.     ED Results / Procedures / Treatments   Labs (all labs ordered are listed, but only abnormal results are displayed) Labs Reviewed  BASIC METABOLIC PANEL - Abnormal; Notable for the following components:      Result Value   Glucose, Bld 101 (*)    All other components within normal limits  CBC - Abnormal; Notable for the following components:   WBC 10.6 (*)    All other components within normal limits  TROPONIN I (HIGH SENSITIVITY)  TROPONIN I (HIGH SENSITIVITY)    EKG EKG Interpretation Date/Time:  Friday March 11 2023 16:05:33 EST Ventricular Rate:  71 PR Interval:  136 QRS Duration:  96 QT Interval:  420 QTC Calculation: 456 R Axis:   45  Text Interpretation: Normal sinus rhythm with sinus arrhythmia Normal ECG When compared with ECG of 27-Aug-2014 16:03, No significant change since last tracing Confirmed by Meridee Score [phone]) on 03/11/2023 4:07:33 PM  Radiology DG Chest 2 View Result Date: 03/11/2023 CLINICAL DATA:  Left jaw pain for 2 days, left shoulder pain EXAM: CHEST - 2 VIEW COMPARISON:  03/29/2014 FINDINGS: Frontal and lateral views of the chest demonstrate an unremarkable cardiac silhouette. No acute airspace disease, effusion, or pneumothorax. No acute bony abnormality. IMPRESSION: 1. No acute intrathoracic process. Electronically Signed   By: Sharlet Salina M.D.   On: 03/11/2023 16:50    Procedures Procedures    Medications Ordered in ED Medications  clindamycin (CLEOCIN) capsule 300  mg (has no administration in time range)    ED Course/ Medical Decision Making/ A&P  Patient presenting with complaints of jaw pain as described in the HPI.  Patient arrives here with stable vital signs and is afebrile.  Physical examination reveals tenderness over the left lateral mandible, but no palpable abnormality.  Laboratory studies ordered in triage including CBC, metabolic panel, and troponin x 2, all of which are unremarkable.  Chest x-ray obtained showing no acute process.  Patient presenting with jaw pain that I suspect is dental in nature.  She does have several crowns over the left lower molars, but no obvious abscess.  Patient to be treated with clindamycin, pain medication, and follow-up as needed.  I highly doubt a cardiac etiology as her symptoms are reproducible and workup is negative.  Final Clinical Impression(s) / ED Diagnoses Final diagnoses:  None    Rx / DC Orders ED Discharge Orders     None         Geoffery Lyons, MD 03/11/23 [phone]

## 2023-03-11 NOTE — ED Triage Notes (Signed)
Pt with left jaw pain x 2 days.  Denies any dental issues.  Pain to left shoulder as well, denies any CP.
